# Patient Record
Sex: Male | Born: 1950 | Race: White | Hispanic: No | Marital: Married | Smoking: Former smoker
Health system: Southern US, Community
[De-identification: ages and names within clinical notes are randomized; demographics above are authoritative.]

## PROBLEM LIST (undated history)

## (undated) DIAGNOSIS — M545 Low back pain, unspecified: Secondary | ICD-10-CM

## (undated) DIAGNOSIS — Z8781 Personal history of (healed) traumatic fracture: Secondary | ICD-10-CM

## (undated) DIAGNOSIS — S82409A Unspecified fracture of shaft of unspecified fibula, initial encounter for closed fracture: Secondary | ICD-10-CM

## (undated) DIAGNOSIS — N529 Male erectile dysfunction, unspecified: Secondary | ICD-10-CM

## (undated) DIAGNOSIS — R3129 Other microscopic hematuria: Secondary | ICD-10-CM

## (undated) DIAGNOSIS — H9191 Unspecified hearing loss, right ear: Secondary | ICD-10-CM

## (undated) DIAGNOSIS — Z87438 Personal history of other diseases of male genital organs: Secondary | ICD-10-CM

## (undated) DIAGNOSIS — E785 Hyperlipidemia, unspecified: Secondary | ICD-10-CM

## (undated) DIAGNOSIS — Z87442 Personal history of urinary calculi: Secondary | ICD-10-CM

## (undated) DIAGNOSIS — H9312 Tinnitus, left ear: Secondary | ICD-10-CM

## (undated) DIAGNOSIS — R310 Gross hematuria: Secondary | ICD-10-CM

## (undated) DIAGNOSIS — M8660 Other chronic osteomyelitis, unspecified site: Secondary | ICD-10-CM

## (undated) DIAGNOSIS — E669 Obesity, unspecified: Secondary | ICD-10-CM

## (undated) DIAGNOSIS — E66812 Obesity, class 2: Secondary | ICD-10-CM

## (undated) DIAGNOSIS — Z87828 Personal history of other (healed) physical injury and trauma: Secondary | ICD-10-CM

## (undated) DIAGNOSIS — D751 Secondary polycythemia: Secondary | ICD-10-CM

## (undated) DIAGNOSIS — Z86010 Personal history of colonic polyps: Secondary | ICD-10-CM

## (undated) DIAGNOSIS — Z8619 Personal history of other infectious and parasitic diseases: Secondary | ICD-10-CM

## (undated) DIAGNOSIS — G459 Transient cerebral ischemic attack, unspecified: Secondary | ICD-10-CM

## (undated) DIAGNOSIS — N138 Other obstructive and reflux uropathy: Secondary | ICD-10-CM

## (undated) DIAGNOSIS — I10 Essential (primary) hypertension: Secondary | ICD-10-CM

## (undated) DIAGNOSIS — I5189 Other ill-defined heart diseases: Secondary | ICD-10-CM

## (undated) DIAGNOSIS — I4729 Other ventricular tachycardia: Secondary | ICD-10-CM

## (undated) DIAGNOSIS — Z125 Encounter for screening for malignant neoplasm of prostate: Secondary | ICD-10-CM

## (undated) DIAGNOSIS — G4733 Obstructive sleep apnea (adult) (pediatric): Secondary | ICD-10-CM

## (undated) DIAGNOSIS — N401 Enlarged prostate with lower urinary tract symptoms: Secondary | ICD-10-CM

## (undated) DIAGNOSIS — N200 Calculus of kidney: Secondary | ICD-10-CM

## (undated) HISTORY — DX: Benign prostatic hyperplasia with lower urinary tract symptoms: N13.8

## (undated) HISTORY — DX: Hyperlipidemia, unspecified: E78.5

## (undated) HISTORY — PX: TONSILLECTOMY: SUR1361

## (undated) HISTORY — DX: Essential (primary) hypertension: I10

## (undated) HISTORY — DX: Other microscopic hematuria: R31.29

## (undated) HISTORY — DX: Personal history of colonic polyps: Z86.010

## (undated) HISTORY — DX: Obstructive sleep apnea (adult) (pediatric): G47.33

## (undated) HISTORY — DX: Calculus of kidney: N20.0

## (undated) HISTORY — DX: Other ill-defined heart diseases: I51.89

## (undated) HISTORY — PX: OTHER SURGICAL HISTORY: SHX169

## (undated) HISTORY — DX: Unspecified fracture of shaft of unspecified fibula, initial encounter for closed fracture: S82.409A

## (undated) HISTORY — PX: CYSTOSCOPY: SUR368

## (undated) HISTORY — DX: Transient cerebral ischemic attack, unspecified: G45.9

## (undated) HISTORY — DX: Benign prostatic hyperplasia with lower urinary tract symptoms: N40.1

## (undated) HISTORY — PX: TRANSTHORACIC ECHOCARDIOGRAM: SHX275

## (undated) HISTORY — DX: Obesity, class 2: E66.812

## (undated) HISTORY — DX: Low back pain, unspecified: M54.50

## (undated) HISTORY — DX: Low back pain: M54.5

## (undated) HISTORY — DX: Secondary polycythemia: D75.1

## (undated) HISTORY — PX: LUMBAR LAMINECTOMY: SHX95

## (undated) HISTORY — DX: Other ventricular tachycardia: I47.29

## (undated) HISTORY — DX: Other chronic osteomyelitis, unspecified site: M86.60

## (undated) HISTORY — DX: Encounter for screening for malignant neoplasm of prostate: Z12.5

## (undated) HISTORY — DX: Obesity, unspecified: E66.9

---
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
## 2005-04-07 DIAGNOSIS — Z9889 Other specified postprocedural states: Secondary | ICD-10-CM

## 2005-05-11 ENCOUNTER — Inpatient Hospital Stay (HOSPITAL_COMMUNITY): Admission: AD | Admit: 2005-05-11 | Discharge: 2005-05-18 | Payer: Self-pay | Admitting: Internal Medicine

## 2005-05-11 DIAGNOSIS — T847XXA Infection and inflammatory reaction due to other internal orthopedic prosthetic devices, implants and grafts, initial encounter: Secondary | ICD-10-CM

## 2005-05-16 ENCOUNTER — Ambulatory Visit: Payer: Self-pay | Admitting: Internal Medicine

## 2005-05-18 ENCOUNTER — Encounter: Payer: Self-pay | Admitting: Internal Medicine

## 2005-05-22 HISTORY — PX: COLONOSCOPY W/ POLYPECTOMY: SHX1380

## 2005-06-27 ENCOUNTER — Ambulatory Visit: Payer: Self-pay | Admitting: Infectious Diseases

## 2005-09-27 ENCOUNTER — Ambulatory Visit: Payer: Self-pay | Admitting: Infectious Diseases

## 2005-11-03 ENCOUNTER — Ambulatory Visit: Payer: Self-pay | Admitting: Infectious Diseases

## 2005-11-29 ENCOUNTER — Ambulatory Visit: Payer: Self-pay | Admitting: Infectious Diseases

## 2006-02-06 ENCOUNTER — Ambulatory Visit: Payer: Self-pay | Admitting: Internal Medicine

## 2006-02-21 ENCOUNTER — Ambulatory Visit: Payer: Self-pay | Admitting: Infectious Diseases

## 2006-02-21 DIAGNOSIS — M8660 Other chronic osteomyelitis, unspecified site: Secondary | ICD-10-CM

## 2006-02-21 HISTORY — DX: Other chronic osteomyelitis, unspecified site: M86.60

## 2006-04-09 ENCOUNTER — Ambulatory Visit: Payer: Self-pay | Admitting: Internal Medicine

## 2006-04-23 ENCOUNTER — Encounter: Payer: Self-pay | Admitting: Internal Medicine

## 2006-04-23 ENCOUNTER — Encounter (INDEPENDENT_AMBULATORY_CARE_PROVIDER_SITE_OTHER): Payer: Self-pay | Admitting: Specialist

## 2006-04-23 ENCOUNTER — Ambulatory Visit (HOSPITAL_COMMUNITY): Admission: RE | Admit: 2006-04-23 | Discharge: 2006-04-23 | Payer: Self-pay | Admitting: Gastroenterology

## 2006-06-26 ENCOUNTER — Inpatient Hospital Stay (HOSPITAL_COMMUNITY): Admission: RE | Admit: 2006-06-26 | Discharge: 2006-06-28 | Payer: Self-pay | Admitting: Neurosurgery

## 2006-06-27 ENCOUNTER — Ambulatory Visit: Payer: Self-pay | Admitting: Internal Medicine

## 2006-07-06 ENCOUNTER — Encounter: Payer: Self-pay | Admitting: Infectious Diseases

## 2006-08-29 ENCOUNTER — Ambulatory Visit: Payer: Self-pay | Admitting: Internal Medicine

## 2006-11-05 ENCOUNTER — Telehealth (INDEPENDENT_AMBULATORY_CARE_PROVIDER_SITE_OTHER): Payer: Self-pay | Admitting: *Deleted

## 2006-11-06 ENCOUNTER — Ambulatory Visit: Payer: Self-pay | Admitting: Internal Medicine

## 2007-07-17 ENCOUNTER — Encounter: Payer: Self-pay | Admitting: Internal Medicine

## 2007-08-08 ENCOUNTER — Ambulatory Visit: Payer: Self-pay | Admitting: Internal Medicine

## 2007-08-09 ENCOUNTER — Encounter (INDEPENDENT_AMBULATORY_CARE_PROVIDER_SITE_OTHER): Payer: Self-pay | Admitting: *Deleted

## 2007-08-12 ENCOUNTER — Encounter (INDEPENDENT_AMBULATORY_CARE_PROVIDER_SITE_OTHER): Payer: Self-pay | Admitting: *Deleted

## 2007-09-01 LAB — CONVERTED CEMR LAB
ALT: 20 units/L (ref 0–53)
Basophils Absolute: 0.1 10*3/uL (ref 0.0–0.1)
Bilirubin, Direct: 0.1 mg/dL (ref 0.0–0.3)
CO2: 28 meq/L (ref 19–32)
Calcium: 9.1 mg/dL (ref 8.4–10.5)
Chloride: 104 meq/L (ref 96–112)
Cholesterol: 185 mg/dL (ref 0–200)
LDL Cholesterol: 129 mg/dL — ABNORMAL HIGH (ref 0–99)
Lymphocytes Relative: 30.2 % (ref 12.0–46.0)
MCHC: 32.4 g/dL (ref 30.0–36.0)
Neutro Abs: 3.9 10*3/uL (ref 1.4–7.7)
Neutrophils Relative %: 56.1 % (ref 43.0–77.0)
RDW: 14.3 % (ref 11.5–14.6)
Sodium: 139 meq/L (ref 135–145)
TSH: 1.05 microintl units/mL (ref 0.35–5.50)
Total Bilirubin: 1 mg/dL (ref 0.3–1.2)
Triglycerides: 93 mg/dL (ref 0–149)
VLDL: 19 mg/dL (ref 0–40)

## 2007-09-02 ENCOUNTER — Encounter (INDEPENDENT_AMBULATORY_CARE_PROVIDER_SITE_OTHER): Payer: Self-pay | Admitting: *Deleted

## 2007-10-03 ENCOUNTER — Telehealth (INDEPENDENT_AMBULATORY_CARE_PROVIDER_SITE_OTHER): Payer: Self-pay | Admitting: *Deleted

## 2007-10-28 ENCOUNTER — Telehealth (INDEPENDENT_AMBULATORY_CARE_PROVIDER_SITE_OTHER): Payer: Self-pay | Admitting: *Deleted

## 2008-06-25 ENCOUNTER — Ambulatory Visit: Payer: Self-pay | Admitting: Internal Medicine

## 2008-06-29 ENCOUNTER — Encounter (INDEPENDENT_AMBULATORY_CARE_PROVIDER_SITE_OTHER): Payer: Self-pay | Admitting: *Deleted

## 2008-07-06 ENCOUNTER — Encounter: Payer: Self-pay | Admitting: Internal Medicine

## 2008-10-07 ENCOUNTER — Ambulatory Visit: Payer: Self-pay | Admitting: Family Medicine

## 2008-10-07 DIAGNOSIS — R252 Cramp and spasm: Secondary | ICD-10-CM

## 2008-10-07 LAB — CONVERTED CEMR LAB
BUN: 14 mg/dL (ref 6–23)
Chloride: 108 meq/L (ref 96–112)
Magnesium: 2.3 mg/dL (ref 1.5–2.5)
Potassium: 4.4 meq/L (ref 3.5–5.1)

## 2008-10-08 ENCOUNTER — Encounter: Payer: Self-pay | Admitting: Family Medicine

## 2008-10-08 ENCOUNTER — Encounter (INDEPENDENT_AMBULATORY_CARE_PROVIDER_SITE_OTHER): Payer: Self-pay | Admitting: *Deleted

## 2008-10-11 LAB — CONVERTED CEMR LAB: Vit D, 25-Hydroxy: 36 ng/mL (ref 30–89)

## 2008-10-12 ENCOUNTER — Encounter (INDEPENDENT_AMBULATORY_CARE_PROVIDER_SITE_OTHER): Payer: Self-pay | Admitting: *Deleted

## 2009-03-09 ENCOUNTER — Telehealth (INDEPENDENT_AMBULATORY_CARE_PROVIDER_SITE_OTHER): Payer: Self-pay | Admitting: *Deleted

## 2009-03-30 ENCOUNTER — Ambulatory Visit: Payer: Self-pay | Admitting: Internal Medicine

## 2009-03-30 DIAGNOSIS — Z860100 Personal history of colon polyps, unspecified: Secondary | ICD-10-CM | POA: Insufficient documentation

## 2009-03-30 DIAGNOSIS — N400 Enlarged prostate without lower urinary tract symptoms: Secondary | ICD-10-CM

## 2009-03-30 DIAGNOSIS — E785 Hyperlipidemia, unspecified: Secondary | ICD-10-CM

## 2009-03-30 DIAGNOSIS — E559 Vitamin D deficiency, unspecified: Secondary | ICD-10-CM | POA: Insufficient documentation

## 2009-04-05 ENCOUNTER — Encounter (INDEPENDENT_AMBULATORY_CARE_PROVIDER_SITE_OTHER): Payer: Self-pay | Admitting: *Deleted

## 2009-04-26 ENCOUNTER — Telehealth (INDEPENDENT_AMBULATORY_CARE_PROVIDER_SITE_OTHER): Payer: Self-pay | Admitting: *Deleted

## 2009-05-17 ENCOUNTER — Ambulatory Visit: Payer: Self-pay | Admitting: Internal Medicine

## 2009-05-17 ENCOUNTER — Telehealth: Payer: Self-pay | Admitting: Internal Medicine

## 2009-05-24 ENCOUNTER — Telehealth: Payer: Self-pay | Admitting: Internal Medicine

## 2009-06-22 ENCOUNTER — Ambulatory Visit: Payer: Self-pay | Admitting: Internal Medicine

## 2009-07-05 LAB — CONVERTED CEMR LAB
Albumin: 3.9 g/dL (ref 3.5–5.2)
Cholesterol: 181 mg/dL (ref 0–200)
HDL: 43.4 mg/dL (ref >39.00)
Total Protein: 7.2 g/dL (ref 6.0–8.3)
VLDL: 22 mg/dL (ref 0.0–40.0)

## 2009-09-22 ENCOUNTER — Encounter: Payer: Self-pay | Admitting: Internal Medicine

## 2009-09-28 ENCOUNTER — Encounter: Payer: Self-pay | Admitting: Internal Medicine

## 2010-04-21 ENCOUNTER — Ambulatory Visit: Payer: Self-pay | Admitting: Internal Medicine

## 2010-04-28 ENCOUNTER — Telehealth (INDEPENDENT_AMBULATORY_CARE_PROVIDER_SITE_OTHER): Payer: Self-pay | Admitting: *Deleted

## 2010-04-28 ENCOUNTER — Ambulatory Visit: Payer: Self-pay | Admitting: Internal Medicine

## 2010-06-11 ENCOUNTER — Encounter: Payer: Self-pay | Admitting: Internal Medicine

## 2010-06-11 ENCOUNTER — Encounter: Payer: Self-pay | Admitting: Infectious Diseases

## 2010-06-16 ENCOUNTER — Encounter: Payer: Self-pay | Admitting: Internal Medicine

## 2010-06-16 ENCOUNTER — Ambulatory Visit
Admission: RE | Admit: 2010-06-16 | Discharge: 2010-06-16 | Payer: Self-pay | Source: Home / Self Care | Attending: Internal Medicine | Admitting: Internal Medicine

## 2010-06-16 DIAGNOSIS — M546 Pain in thoracic spine: Secondary | ICD-10-CM | POA: Insufficient documentation

## 2010-06-16 DIAGNOSIS — I10 Essential (primary) hypertension: Secondary | ICD-10-CM | POA: Insufficient documentation

## 2010-06-16 LAB — CONVERTED CEMR LAB
Cholesterol, target level: 200 mg/dL
LDL Goal: 160 mg/dL

## 2010-06-19 LAB — CONVERTED CEMR LAB
ALT: 23 units/L (ref 0–53)
AST: 19 units/L (ref 0–37)
BUN: 7 mg/dL (ref 6–23)
Basophils Relative: 0.8 % (ref 0.0–3.0)
Chloride: 103 meq/L (ref 96–112)
Eosinophils Relative: 2.5 % (ref 0.0–5.0)
HCT: 52.6 % — ABNORMAL HIGH (ref 39.0–52.0)
Hemoglobin: 18 g/dL — ABNORMAL HIGH (ref 13.0–17.0)
Lymphs Abs: 1.6 10*3/uL (ref 0.7–4.0)
MCV: 91 fL (ref 78.0–100.0)
Monocytes Absolute: 1.2 10*3/uL — ABNORMAL HIGH (ref 0.1–1.0)
Neutro Abs: 3.2 10*3/uL (ref 1.4–7.7)
Potassium: 3.9 meq/L (ref 3.5–5.1)
RBC: 5.78 M/uL (ref 4.22–5.81)
Sodium: 141 meq/L (ref 135–145)
TSH: 1.11 microintl units/mL (ref 0.35–5.50)
Total Protein: 7.5 g/dL (ref 6.0–8.3)
Vit D, 25-Hydroxy: 38 ng/mL (ref 30–89)
WBC: 6.3 10*3/uL (ref 4.5–10.5)

## 2010-06-21 ENCOUNTER — Ambulatory Visit
Admission: RE | Admit: 2010-06-21 | Discharge: 2010-06-21 | Payer: Self-pay | Source: Home / Self Care | Attending: Internal Medicine | Admitting: Internal Medicine

## 2010-06-21 ENCOUNTER — Other Ambulatory Visit: Payer: Self-pay | Admitting: Internal Medicine

## 2010-06-21 ENCOUNTER — Encounter: Payer: Self-pay | Admitting: Internal Medicine

## 2010-06-21 LAB — BASIC METABOLIC PANEL
BUN: 14 mg/dL (ref 6–23)
CO2: 29 mEq/L (ref 19–32)
GFR: 66.55 mL/min (ref >60.00)
Glucose, Bld: 94 mg/dL (ref 70–99)
Potassium: 4.7 mEq/L (ref 3.5–5.1)

## 2010-06-21 LAB — CBC WITH DIFFERENTIAL/PLATELET
Eosinophils Absolute: 0.2 10*3/uL (ref 0.0–0.7)
Eosinophils Relative: 2.8 % (ref 0.0–5.0)
HCT: 52.2 % — ABNORMAL HIGH (ref 39.0–52.0)
Lymphocytes Relative: 31 % (ref 12.0–46.0)
Monocytes Absolute: 0.8 10*3/uL (ref 0.1–1.0)
Monocytes Relative: 9.7 % (ref 3.0–12.0)
Neutro Abs: 4.6 10*3/uL (ref 1.4–7.7)
Neutrophils Relative %: 56.1 % (ref 43.0–77.0)
Platelets: 172 10*3/uL (ref 150.0–400.0)
RDW: 15 % — ABNORMAL HIGH (ref 11.5–14.6)
WBC: 8.2 10*3/uL (ref 4.5–10.5)

## 2010-06-21 LAB — LIPID PANEL
HDL: 38.4 mg/dL — ABNORMAL LOW (ref >39.00)
Total CHOL/HDL Ratio: 5
VLDL: 30 mg/dL (ref 0.0–40.0)

## 2010-06-21 LAB — HEPATIC FUNCTION PANEL
Albumin: 4.3 g/dL (ref 3.5–5.2)
Total Protein: 7.5 g/dL (ref 6.0–8.3)

## 2010-06-21 LAB — TSH: TSH: 1.21 u[IU]/mL (ref 0.35–5.50)

## 2010-06-23 NOTE — Progress Notes (Signed)
Summary: Triage/Med Request  Phone Note Refill Request Message from:  Patient on May 24, 2009 9:05 AM  Refills Requested: Medication #1:  LEVAQUIN 500 MG TABS 1 once daily. He feels better but feels like he needs another 10 day dosage to get it completely gone. Pt. stated that he only had a 5 day dosage given to him and feels like 10 days would help get it completely gone. Call his cell phone [phone] as soon as possible  Initial call taken by: Michaelle Copas,  May 24, 2009 9:05 AM  Follow-up for Phone Call        #5, 1 once daily . Report diarrhea(take Align once daily for any bowel changes) Follow-up by: Marga Melnick MD,  May 24, 2009 9:30 AM  Additional Follow-up for Phone Call Additional follow up Details #1::        pt aware, rx sent to pharmacy.........Marland KitchenFelecia Deloach CMA  May 24, 2009 9:45 AM     Prescriptions: LEVAQUIN 500 MG TABS (LEVOFLOXACIN) 1 once daily  #5 x 0   Entered by:   Jeremy Johann CMA   Authorized by:   Marga Melnick MD   Signed by:   Jeremy Johann CMA on 05/24/2009   Method used:   Faxed to ...       [facility]* (retail)       [address] [place], [place]  [address]       Ph: [phone]       Fax: [phone]   RxID:   [id number]

## 2010-06-23 NOTE — Progress Notes (Signed)
Summary: [place]: ? Sinus Infection  Phone Note Call from Patient Call back at [phone]   Complaint: Urinary/GYN Problems Summary of Call: Message left on Triage Voicemail: Please call-? sinus infection  Patient was seen on 04/21/2010, all symptoms subsided but now:  Bridge of nose hurts(feels like someone hit him in the nose) &  runny nose, patient really feels like he has a sinus infection and would like an antibiotic called in.  Dr.Hopper please advise [facility]  Initial call taken by: Shonna Chock CMA,  April 28, 2010 1:35 PM  Follow-up for Phone Call        I spoke with patient, per Dr.Hopper nasel spray can be called in. Patient needs to watch for fever and discolored drainage.  Patient scheduled appointment for today at 4:20pm and indicated he was at his dermatologist office and if they could prescribe something he will cancle appointment here. Follow-up by: Shonna Chock CMA,  April 28, 2010 3:06 PM

## 2010-06-23 NOTE — Assessment & Plan Note (Signed)
Summary: cpx///sph   Vital Signs:  Patient profile:   60 year old male Temp:     98.4 degrees F oral Pulse rate:   84 / minute Resp:     14 per minute BP sitting:   140 / 88  (left arm) Cuff size:   large  Vitals Entered By: Shonna Chock CMA (June 16, 2010 1:34 PM) CC: CPX , Back pain, Lipid Management   Primary Care Provider:  Alwyn Ren  CC:  CPX , Back pain, and Lipid Management.  History of Present Illness:    [name] is here for a physical; he has recurrent low grade back pain, aggravated by poor  posture.This impacts sleep. PMH of fractured L-1.  The patient denies associated  fever, chills, weakness, loss of sensation, fecal incontinence, urinary incontinence, and urinary retention.  The pain is located in the right lower  thoracic back.  The  dul, intermittent pain is made better by inactivity and NSAID medications.    Lipid Management History:      Positive NCEP/ATP III risk factors include male age 75 years old or older.  Negative NCEP/ATP III risk factors include non-diabetic, no family history for ischemic heart disease, non-tobacco-user status, non-hypertensive, no ASHD (atherosclerotic heart disease), no peripheral vascular disease, and no history of aortic aneurysm.     Current Medications (verified): 1)  Tamsulosin Hcl 0.4 Mg Caps (Tamsulosin Hcl) .[name].. 1 By Mouth Once Daily 2)  Adult Aspirin Ec Low Strength 81 Mg  Tbec (Aspirin) .... Take One Tablet Daily 3)  Vitamin D .... Take 1 Tab Once Daily  Allergies: 1)  ! * Larium  Past History:  Past Medical History: OSTEOMYELITIS, CHRONIC, PMH of  (ICD-730.10) in spine , Staph INFECTION DUE TO INTERNAL ORTH DEVICE  [phone]); resolution post removal of hardware Colonic polyps, PMH  of 2007 Hyperlipidemia: Framingham Study LDL goa = < 160. 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, Dr Darvin Neighbours ( he wil se Urologist in  [place])  Past Surgical History: Tonsillectomy Colon polypectomy 2007, Carman Ching MD Lumbar laminectomy post L1 fracture post fall, post operative  course complicated by Staph Osteomyelitis  Family History: father: lung cancer, smoker,on  submarine for 12 years mother: CVA,granuloma of lung, lung cancer maternal grandmother: MI @ 21 maternal grandfather: MI @ 85  Social History: Former Smoker: quit 1985 No diet 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         Weight  varies 230-240  Physical Exam  General:  in no acute distress; alert,appropriate and cooperative throughout examination Head:  Normocephalic and atraumatic without obvious abnormalities.  Eyes:  No corneal or conjunctival inflammation noted. Perrla. Funduscopic exam benign, without hemorrhages, exudates or papilledema.  Ears:  External ear exam shows no significant lesions or deformities.  Otoscopic examination reveals clear canals, tympanic membranes are intact bilaterally without bulging, retraction, inflammation or discharge. Hearing is grossly normal bilaterally. Nose:  External nasal examination shows no deformity or inflammation. Nasal mucosa are pink and moist without lesions or exudates. Mouth:  Oral mucosa and oropharynx without lesions or exudates.  Teeth in good repair. Neck:  No  deformities, masses, or tenderness noted. Lungs:  Normal respiratory effort, chest expands symmetrically. Lungs are clear to auscultation, no crackles or wheezes. Heart:  Normal rate and  regular rhythm. S1 and S2 normal without gallop, murmur, click, rub .S4  Abdomen:  Bowel sounds positive,abdomen soft and non-tender without masses, organomegaly .Ventral hernia  noted. Genitalia:  Urology Msk:  Asymmetry of thoracic muscles; R > L Pulses:  R and L carotid,radial,dorsalis pedis and posterior tibial pulses are full and equal bilaterally Extremities:  No clubbing, cyanosis, edema, or deformity noted with normal full range of motion of all joints.   Neurologic:  alert & oriented X3 and DTRs symmetrical and normal.   Neg SLR Skin:  Intact without suspicious lesions or rashes Cervical Nodes:  No lymphadenopathy noted Axillary Nodes:  No palpable lymphadenopathy Psych:  memory intact for recent and remote, normally interactive, and good eye contact.     Impression & Recommendations:  Problem # 1:  ROUTINE GENERAL MEDICAL EXAM@HEALTH  CARE FACL (ICD-V70.0)  Orders: EKG w/ Interpretation ([address])  Problem # 2:  BACK PAIN, THORACIC REGION, CHRONIC (ICD-724.1)  His updated medication list for this problem includes:    Adult Aspirin Ec Low Strength 81 Mg Tbec (Aspirin) .[name]... Take one tablet daily    Tramadol Hcl 50 Mg Tabs (Tramadol hcl) .[name]... 1every 6 hrs as needed    Cyclobenzaprine Hcl 5 Mg Tabs (Cyclobenzaprine hcl) .[name]... 1-2 at bedtime as needed  Problem # 3:  HYPERLIPIDEMIA (ICD-272.4)  Problem # 4:  COLONIC POLYPS, HX OF (ICD-V12.72)  Orders: Gastroenterology Referral (GI)  Problem # 5:  VITAMIN D DEFICIENCY (ICD-268.9)  Problem # 6:  BENIGN PROSTATIC HYPERTROPHY (ICD-600.00) as per [facility] His updated medication list for this problem includes:    Tamsulosin Hcl 0.4 Mg Caps (Tamsulosin hcl) .[name]... 1 by mouth once daily  Problem # 7:  ELEVATED BLOOD PRESSURE WITHOUT DIAGNOSIS OF HYPERTENSION (ICD-796.2)  Complete Medication List: 1)  Tamsulosin Hcl 0.4 Mg Caps (Tamsulosin hcl) .[name].. 1 by mouth once daily 2)  Adult Aspirin Ec Low Strength 81 Mg Tbec  (Aspirin) .... Take one tablet daily 3)  Vitamin D  .... Take 1 tab once daily 4)  Tramadol Hcl 50 Mg Tabs (Tramadol hcl) .[name].. 1every 6 hrs as needed 5)  Cyclobenzaprine Hcl 5 Mg Tabs (Cyclobenzaprine hcl) .[name].. 1-2 at bedtime as needed  Lipid Assessment/Plan:      Based on NCEP/ATP III, the patient's risk factor category is "0-1 risk factors".  The patient's lipid goals are as follows: Total cholesterol goal is 200; LDL cholesterol goal is 160; HDL cholesterol goal is 40; Triglyceride goal is 150.    Patient Instructions: 1)   Stretching exercises as discussed.Please schedule fasting labs; see Diagnoses for Codes: Vitamin D level; 2)  BMP; 3)  Hepatic Panel ; 4)  Lipid Panel ; 5)  TSH ; 6)  CBC w/ Diff; 7)  PSA . 8)  Check your Blood Pressure regularly. If it is above: 135/85 ON AVERAGE  you should make an appointment. Prescriptions: TAMSULOSIN HCL 0.4 MG CAPS (TAMSULOSIN HCL) 1 by mouth once daily  #90 x 3   Entered and Authorized by:   Marga Melnick MD   Signed by:   Marga Melnick MD on 06/16/2010   Method used:   Electronically to        [facility]* (retail)       [address] [place], [place]  [address]  Ph: [phone]       Fax: [phone]   RxID:   [id number] CYCLOBENZAPRINE HCL 5 MG TABS (CYCLOBENZAPRINE HCL) 1-2 at bedtime as needed  #30 x 1   Entered and Authorized by:   Marga Melnick MD   Signed by:   Marga Melnick MD on 06/16/2010   Method used:   Electronically to        [facility]* (retail)       [address] [place], [place]  [address]       Ph: [phone]       Fax: [phone]   RxID:   [id number] TRAMADOL HCL 50 MG TABS (TRAMADOL HCL) 1every 6 hrs as needed  #30 x 2   Entered and Authorized by:   Marga Melnick MD   Signed by:   Marga Melnick MD on 06/16/2010   Method used:   Electronically to        [facility]* (retail)       [address] [place], [place]  [address]       Ph: [phone]        Fax: [phone]   RxID:   [id number]    Orders Added: 1)  Est. Patient 40-64 years [[address]] 2)  EKG w/ Interpretation [93[address]] 3)  Gastroenterology Referral [GI]   Immunization History:  Tetanus/Td Immunization History:    Tetanus/Td:  historical (05/22/2004)   Immunization History:  Tetanus/Td Immunization History:    Tetanus/Td:  Historical (05/22/2004)    Appended Document: cpx///sph Patient's Height/Weight recorded by Misty Stanley : H: 67.5 inches  W:246.2

## 2010-06-23 NOTE — Assessment & Plan Note (Signed)
Summary: BRONCHITIS/KN   Vital Signs:  Patient profile:   60 year old male Weight:      246.6 pounds BMI:     38.76 O2 Sat:      95 % on Room air Temp:     98.3 degrees F oral Pulse rate:   101 / minute Resp:     15 per minute BP sitting:   132 / 90  (left arm) Cuff size:   large  Vitals Entered By: Shonna Chock CMA (April 21, 2010 3:55 PM)  O2 Flow:  Room air CC: 1.) ? Bronchitis-onset: Sunday    2.) Discuss changing Tamsulosin to Finasteride, URI symptoms   Primary Care Provider:  Alwyn Ren  CC:  1.) ? Bronchitis-onset: Sunday    2.) Discuss changing Tamsulosin to Finasteride and URI symptoms.  History of Present Illness:  RTI Symptoms      This is a 60 year old man who presents with RTI  symptoms since 11/27 ; onset as ST & dry cough.  The patient now  reports nasal congestion and purulent nasal discharge, but denies productive cough and earache.  Associated symptoms include slight  wheezing.  The patient denies fever and dyspnea.  The patient denies headache.  The patient denies the following risk factors for Strep sinusitis: unilateral facial pain, tooth pain, and tender adenopathy.  Rx: Mucinex  Current Medications (verified): 1)  Tamsulosin Hcl 0.4 Mg Caps (Tamsulosin Hcl) .[name].. 1 By Mouth Once Daily 2)  Adult Aspirin Ec Low Strength 81 Mg  Tbec (Aspirin) .... Take One Tablet Daily 3)  Vitamin D .... Take 1 Tab Once Daily  Allergies: 1)  ! * Larium  Physical Exam  General:  in no acute distress; alert,appropriate and cooperative throughout examination Ears:  External ear exam shows no significant lesions or deformities.  Otoscopic examination reveals L TM  erythema superiorly Nose:  External nasal examination shows no deformity or inflammation. Nasal mucosa are pink and moist without lesions or exudates. Mouth:  Oral mucosa and oropharynx without lesions or exudates.  Teeth in good repair. Slightly hoarse Lungs:  Normal respiratory effort, chest expands symmetrically.  Lungs are clear to auscultation, no crackles or wheezes. Heart:  Normal rate and regular rhythm. S1 and S2 normal without gallop, murmur, click, rub or other extra sounds. Cervical Nodes:  No lymphadenopathy noted Axillary Nodes:  No palpable lymphadenopathy   Impression & Recommendations:  Problem # 1:  BRONCHITIS-ACUTE (ICD-466.0)  The following medications were removed from the medication list:    Levaquin 500 Mg Tabs (Levofloxacin) .[name]... 1 once daily His updated medication list for this problem includes:    Azithromycin 250 Mg Tabs (Azithromycin) .[name]... As per pack    Hydromet 5-1.5 Mg/60ml Syrp (Hydrocodone-homatropine) .[name]... 1 tsp every 6 hrs as needed  Problem # 2:  URI (ICD-465.9)  His updated medication list for this problem includes:    Adult Aspirin Ec Low Strength 81 Mg Tbec (Aspirin) .[name]... Take one tablet daily    Hydromet 5-1.5 Mg/73ml Syrp (Hydrocodone-homatropine) .[name]... 1 tsp every 6 hrs as needed  Complete Medication List: 1)  Tamsulosin Hcl 0.4 Mg Caps (Tamsulosin hcl) .[name].. 1 by mouth once daily 2)  Adult Aspirin Ec Low Strength 81 Mg Tbec (Aspirin) .... Take one tablet daily 3)  Vitamin D  .... Take 1 tab once daily 4)  Azithromycin 250 Mg Tabs (Azithromycin) .... As per pack 5)  Hydromet 5-1.5 Mg/27ml Syrp (Hydrocodone-homatropine) .[name].. 1 tsp every 6 hrs as  needed  Patient Instructions: 1)  Neti pot once daily as needed for sinus congestion. 2)  Drink as much NON dairy  fluid as you can tolerate for the next few days. Prescriptions: HYDROMET 5-1.5 MG/5ML SYRP (HYDROCODONE-HOMATROPINE) 1 tsp every 6 hrs as needed  #120 cc x 0   Entered and Authorized by:   Marga Melnick MD   Signed by:   Marga Melnick MD on 04/21/2010   Method used:   Printed then faxed to ...       [facility]* (retail)       [address] [place], [place]  [address]       Ph: [phone]       Fax: [phone]   RxID:   [id number] AZITHROMYCIN 250 MG TABS (AZITHROMYCIN) as  per pack  #1 x 0   Entered and Authorized by:   Marga Melnick MD   Signed by:   Marga Melnick MD on 04/21/2010   Method used:   Printed then faxed to ...       [facility]* (retail)       [address] [place], [place]  [address]       Ph: [phone]       Fax: [phone]   RxID:   [id number]    Orders Added: 1)  Est. Patient Level III [[address]]

## 2010-06-23 NOTE — Consult Note (Signed)
Summary: [facility]  [facility]   Imported By: Lanelle Bal 10/06/2009 10:02:55  _____________________________________________________________________  External Attachment:    Type:   Image     Comment:   External Document

## 2010-06-23 NOTE — Letter (Signed)
Summary: [facility]  [facility]   Imported By: Lanelle Bal 10/20/2009 08:37:03  _____________________________________________________________________  External Attachment:    Type:   Image     Comment:   External Document

## 2010-06-27 ENCOUNTER — Telehealth: Payer: Self-pay | Admitting: Internal Medicine

## 2010-07-07 NOTE — Letter (Signed)
Summary: [facility]  [facility]   Imported By: Sherian Rein 06/28/2010 11:53:54  _____________________________________________________________________  External Attachment:    Type:   Image     Comment:   External Document

## 2010-07-07 NOTE — Procedures (Signed)
Summary: Colonoscopy/[place]  Colonoscopy/[place]   Imported By: Sherian Rein 06/28/2010 11:20:58  _____________________________________________________________________  External Attachment:    Type:   Image     Comment:   External Document

## 2010-07-07 NOTE — Progress Notes (Signed)
Summary: ABX request  Phone Note Call from Patient Call back at [phone]   Summary of Call: Patient notes that he has the "crud" again and would like ABX called in since he was just seen. I see that the pt did have his CPX one week ago and was seen a couple of months ago for this issue. I offered office visit today and patient declined and expressed how dissatisfied he is with this and notes that he can call his dentist and get it right now if he wants to. Patient is requesting that you call in ABX without office visit. Please advise. Initial call taken by: Lucious Groves CMA,  June 27, 2010 9:43 AM  Follow-up for Phone Call        Amox 500 three times a day #30 Follow-up by: Marga Melnick MD,  June 27, 2010 11:53 AM  Additional Follow-up for Phone Call Additional follow up Details #1::        Patient notified. Additional Follow-up by: Lucious Groves CMA,  June 27, 2010 1:44 PM    New/Updated Medications: AMOXICILLIN 500 MG TABS (AMOXICILLIN) 1 by mouth three times a day Prescriptions: AMOXICILLIN 500 MG TABS (AMOXICILLIN) 1 by mouth three times a day  #30 x 0   Entered by:   Lucious Groves CMA   Authorized by:   Marga Melnick MD   Signed by:   Lucious Groves CMA on 06/27/2010   Method used:   Electronically to        [facility]* (retail)       [address] [place], [place]  [address]       Ph: [phone]       Fax: [phone]   RxID:   [id number]

## 2010-08-03 ENCOUNTER — Other Ambulatory Visit: Payer: Self-pay | Admitting: Gastroenterology

## 2010-09-05 ENCOUNTER — Encounter: Payer: Self-pay | Admitting: Internal Medicine
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
## 2010-10-07 NOTE — Op Note (Signed)
NAMECOREE, BRAME               ACCOUNT NO.:  [id number]   MEDICAL RECORD NO.:  [id number]          PATIENT TYPE:  AMB   LOCATION:  ENDO                         FACILITY:  [facility]   PHYSICIAN:  James L. Malon Kindle., M.D.DATE OF BIRTH:  November 02, 1950   DATE OF PROCEDURE:  04/23/2006  DATE OF DISCHARGE:                               OPERATIVE REPORT   SURGEON:  Fayrene Fearing L. Randa Evens, M.D.   PROCEDURE:  Colonoscopy and polypectomy.   MEDICATIONS:  Fentanyl 70 mcg, Versed 6 mg IV.   SCOPE:  Pediatric adjustable scope.   INDICATIONS:  Colon cancer screening.   DESCRIPTION OF PROCEDURE:  The procedure had been explained to the  patient and consent obtained.  Left lateral decubitus position.  Digital  exam performed.  The scope inserted and advanced.  The prep was  excellent.  Advanced to the cecum.  The ileocecal valve and appendiceal  orifice were seen, and the scope withdrawn.  Cecum, ascending and  transverse colon seemed well free of polyps.  In the descending colon, a  0.5-cm sessile polyp removed with the cautery snare and sucked with the  scope.  No bleeding seen.  The remainder of the descending and sigmoid  colon and rectum free of polyps.  The rectum was examined in the  retroflex view.  Pertinent negatives  Included diverticula, masses,  tumors, AVMs, etc. The patient tolerated the procedure well with no  immediate complications.   ASSESSMENT:  Descending colon, removed.   PLAN:  Routine postpolypectomy instructions.  Depending on the path, may  recommend repeat in 5 years.           ______________________________  Llana Aliment Malon Kindle., M.D.     Waldron Session  D:  04/23/2006  T:  04/23/2006  Job:  [id number]   cc:   Titus Dubin. Alwyn Ren, MD,FACP,FCCP

## 2010-10-07 NOTE — Consult Note (Signed)
[name], [name] NO.:  [id number]   MEDICAL RECORD NO.:  [id number]          PATIENT TYPE:  INP   LOCATION:  3005                         FACILITY:  [facility]   PHYSICIAN:  Derrick [name], M.D.  DATE OF BIRTH:  [birth year]   DATE OF CONSULTATION:  05/11/2005  DATE OF DISCHARGE:  05/11/2005                                   CONSULTATION   REASON FOR CONSULTATION:  Derrick [name] is a 60 year old man who is a  patient of Dr. Marinda [name] who fell out of a tree stand while hunting deer  in [place] on April 07, 2005.  He had a 16-foot fall resulting in an L1  burst fracture.  He was transferred to [place], [place] where he underwent a  posterior decompression and stabilization procedure which consisted of  pedicle screw fixation for significant left L1 radiculopathy with  retropulsed bone.  This was successfully treated and he was mobilized in the  [place] brace.  One and a half weeks ago, he developed serous drainage  from his wound and then this has gradually increased to purulent drainage  and changed to pus.  He has had spiking fevers but has not had fever while  on antibiotics until yesterday evening.  He does note some decreased  appetite.  Does not note significant nausea, vomiting or fever at the  present time.  He has had an ileus postoperatively.  Otherwise, no  significant problems.  He does have a history of BPH.   He has no known drug allergies.   CURRENT MEDICATIONS:  Flomax 0.4 mg q.h.s., oxycodone 10 mg one to two  tablets every 12 hours, Bactrim trimethoprim two tablets twice daily.  He  previously was treated with cephalexin and Augmentin and these were then  discontinued.  He also takes Skelaxin 800 mg p.o. q.6h. p.r.n. and uses  GlycoLax for laxative.   His current vital signs are temperature of 98.5, blood pressure of 107/61,  pulse of 91, respiratory rate of 20.  On his examination, he has a healing  midline thoracolumbar incision with a small  area of dehiscence with  yellowish purulent drainage from this.  I was not able to express a  significant amount of pus with compression of the wound but he does have  some significant purulent drainage.  Initial cultures have been consistent  with methicillin-sensitive Staph. aureus.  [facility] hospitalist discussed his  situation with Dr. Orvan [name] who recommended that he be treated with Ancef 1  g every 6 hours IV.   On examination today, he is alert and fully oriented.  He has no  meningismus.  He does not appear to be septic.  He has no severe tenderness  over his incision.  His lower extremity strength is full in all motor groups  and bilaterally symmetric.  Reflexes are 2 in the knees and ankles.  Toes  are downgoing to plantar stimulation.  He denies any bowel or bladder  dysfunction.   My impression is of postoperative wound infection in a 60 year old man who  had posterior thoracolumbar stabilization surgery while on vacation  deer  hunting in [place].  I have recommended that he have an MRI of his  thoracolumbar spine with __________ gadolinium to assess for abscess and  also plain radiographs.  He may require incision and drainage and  debridement of this wound but initially would treat him with IV antibiotics  and dressing changes.      Derrick [name], M.D.  Electronically Signed     JDS/MEDQ  D:  05/11/2005  T:  05/14/2005  Job:  [id number]

## 2010-10-07 NOTE — H&P (Signed)
NAMEBRAIDAN, Derrick [name]               ACCOUNT NO.:  [id number]   MEDICAL RECORD NO.:  [id number]          PATIENT TYPE:  INP   LOCATION:  3005                         FACILITY:  [facility]   PHYSICIAN:  Melissa L. Ladona Ridgel, MD  DATE OF BIRTH:  Jun 27, 1950   DATE OF ADMISSION:  05/11/2005  DATE OF DISCHARGE:                                HISTORY & PHYSICAL   CHIEF COMPLAINT:  Fever and infection of a post-surgical site.   PRIMARY CARE PHYSICIAN:  Dr. Marinda Elk.   HISTORY OF PRESENT ILLNESS:  The patient is a 60 year old white male who  fell of a tree stand on April 07, 2005. The patient took a 16 foot fall  landing on his buttocks, resulting in an L1 burst fracture. The patient  underwent extensive surgery to decompress an entrapped nerve root and  recovered, returning to the [facility] area for his further recovery.  Approximately 1-1/2 weeks ago he developed the onset of leakage of clear  fluid from the wound. Dr. Foy Guadalajara started him on antibiotics and obtained  cultures. When the fluid that was draining went from clear in color to a  more purulent material, antibiotics were changed. The cultures returned  showing a methicillin sensitive staph aureus organism and the patient  ultimately was placed on a third antibiotic when the drainage did not appear  to be decreasing. During his course of antibiotic therapy the patient was  not having fevers, however, last night he did have a recurrent fever with no  persistence of the drainage. He therefore decided to come to the emergency  room today for further evaluation.   REVIEW OF SYSTEMS:  Positive fever. No nausea, vomiting. Decreased appetite.  No diarrhea, no constipation. All other review of systems are negative.   PAST MEDICAL HISTORY:  1.  Benign prostatic hypertrophy.  2.  A postoperative ileus.   PAST SURGICAL HISTORY:  Lumbar surgery on the L1 root which was entrapped  and decompressed.   SOCIAL HISTORY:  Denies tobacco,  ethanol or illicit drug use. He is a  Visual merchandiser.   FAMILY HISTORY:  Mom is living in a nursing home after a stroke. Dad is  deceased from lung cancer.   ALLERGIES:  No known drug allergies.   MEDICATIONS:  1.  Flomax 0.4 mg q.h.s.  2.  Oxycodone 10 mg 1-2 tablets p.o. q.12h.  3.  Bactrim two tablets b.i.d. This was the third antibiotic following      amoxicillin and cephalexin.  4.  Skelaxin 800 mg p.o. q.6h p.r.n.  5.  Glycolax every other day.   PHYSICAL EXAMINATION:  VITAL SIGNS:  Temperature is 98.5, blood pressure  107/61, pulse is 91, respirations 20, saturation is 95%.  GENERAL:  He is in no acute distress.  HEENT:  Normocephalic, atraumatic. Pupils are equal, round and reactive to  light. Extraocular muscles are intact. Mucous membranes are moist.  NECK:  Supple. There is no JVD, no lymphadenopathy and no carotid bruits.  CHEST:  Clear to auscultation without rhonchi, rales or wheezes.  CARDIOVASCULAR:  Regular rate and rhythm, positive S1, S2.  No S3, S4. No  murmurs, rubs, or gallops.  ABDOMEN:  Soft, nontender, nondistended with positive bowel sounds.  EXTREMITIES:  Show no clubbing, cyanosis or edema. He has 2+ pulses. He does  have pain with dorsiflexion of the left foot and pressure on the left leg.  NEUROLOGIC:  Sensation appears to be intact. Power appears to be 5/5 in all  extremities. There is some pain associated with left leg lift and pressure  as well as dorsiflexion.   LABORATORY DATA:  Urinalysis is negative. Sodium is 137, potassium 4.5,  chloride 99, CO2 is 25, BUN is 10, creatinine is 1.4. Glucose is 115. His  liver function tests are within normal limits.  Cultures from December 14th in the outpatient setting are located in the  back of the chart and are growing methicillin sensitive staph aureus. Wound  cultures are pending from the emergency room today.   ASSESSMENT AND PLAN:  This is a 60 year old white male status post a fall  from a  tree stand, resulting in L1 burst fracture with resultant nerve root  compression. The patient underwent decompression surgery and postoperatively  was doing well until the wound started to drain serous material. The only  complicating factor to the surgery was a postoperative ileus. In response to  the drainage that started 1-1/2 weeks ago, the patient has been treated with  Keflex, amoxicillin and then Bactrim. When patient started spiking  temperatures again last night he came to the emergency room for further  evaluation.  1.  Infected post surgical site, cultures were growing MSSA on December 14.      We will start him on Ancef and if patient shows signs of worsening      infection or second cultures grow a different organism, we will make      changes of his antibiotics. We are going to get an MRI tonight. Will use      regular dressing change and/or could use a colostomy phalange with a bag      attached so that the dressings do not have to be changed as frequently.  2.  Pain control. Will continue Skelaxin and Oxycodone.  3.  Deep venous thrombosis prophylaxis. Will use STDs for now as the patient      may need to go to the OR in the near future. If he is merely going to      have antibiotic therapy I will switch him to Lovenox.  4.  BPH. Will continue his Flomax.  5.  Gastrointestinal. Will use MiraLax p.r.n. to prevent ileus.      Melissa L. Ladona Ridgel, MD  Electronically Signed     MLT/MEDQ  D:  05/11/2005  T:  05/11/2005  Job:  [id number]   cc:   Molly Maduro L. Foy Guadalajara, M.D.  Fax: [phone]

## 2010-10-07 NOTE — Op Note (Signed)
Derrick [name], [name] NO.:  [id number]   MEDICAL RECORD NO.:  [id number]          PATIENT TYPE:  INP   LOCATION:  2899                         FACILITY:  [facility]   PHYSICIAN:  Danae Orleans. Venetia Maxon, M.D.  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   PROCEDURE:  1. Exploration of the thoracolumbar fusion.  2. Removal of hardware.   SURGEON:  Venetia Maxon   ASSISTANT:  Phoebe Perch with [place], RN   ANESTHESIA:  General endotracheal anesthesia.   BLOOD LOSS:  Minimal.   COMPLICATIONS:  None.   DISPOSITION:  Recovery.   INDICATIONS:  Derrick [name] is a 60 year old man who previously had an  L1 burst fracture after he fell out of a tree stand while hunting in  [place] in 2006.  He came back to [facility] which is where he resides and  developed an infection in his operative site, and I became involved in  his care at that point and initially debrided his wound.  The patient  went on to heal by secondary intention and had a prolonged course of IV  antibiotics.  The patient was put on chronic suppressive therapy under  the guidance of the infectious diseases but every time an effort was  made to stop his antibiotics, he developed spiking fevers.  This went on  3 separate occasions.  His white count also would be elevated.  It was  therefore elected to take him back to surgery to remove his previously  placed instrumentation.  It appeared that he had an adequate arthrodesis  at the previously operated levels.  The plan was not to place any new  hardware but to remove his previously placed hardware and confirm that  there appeared to be a solid fusion.   PROCEDURE:  [name] was  brought to the operating room.  Following a  satisfactory and uncomplicated induction of general endotracheal  anesthesia and placement of intravenous lines, the patient was placed in  a prone position on the operating table, a Foley catheter placed  previous to turning the patient.  His low and mid back were then prepped  and draped in the usual sterile fashion using Betadine scrub and paint.  His area of planned incision was infiltrated with 0.25% Marcaine, 0.5%  lidocaine with 1:100,000 epinephrine.  His previous scar was revised,  and the midline scar was removed.  The incision was carried down to the  previously placed [place] and posterior hardware.  This was all removed,  and the cross links were taken along with several of the screws and sent  to microbiology department for culturing.  There was some yellowish  fluid but not grossly purulent on exposure of the hardware and,  additionally, soft tissues were removed adjacent to the hardware, and  this was also sent for Gram stain and culture.  After the previously  placed hardware was all removed, the Pulsavac Power Irrigator was then  utilized with 2 L of bacitracin saline, and the soft tissues were washed  extensively.  An additional 500 mL of bacitracin was also used to wash  the wound and the surrounding soft tissues.  The posterior fusion mass  was inspected, and exposure was carried to this and using a [place]  rongeur with towel clips to try to mobilize the spine, there did not  appear to be any instability.  Since there was inadequate healing and  posterior fusion, it was elected not to place any new hardware, and the  medium Hemovac drain was inserted through a separate stab exit.  The  fascia was then closed with 1 Vicryl sutures, the subcutaneous tissues  reapproximated with 2-0 Vicryl interrupted inverted sutures, and the  skin edges were reapproximated with interrupted 3-0 Vicryl subcuticular  stitch.  Wound was dressed  with Benzoin, Steri-Strips, Telfa gauze, and  tape.  The patient was extubated in the operating room and taken to the  recovery room in stable satisfactory condition having tolerated his  operation well.  Counts were correct at the case.      Danae Orleans. Venetia Maxon, M.D.  Electronically Signed     JDS/MEDQ  D:  06/26/2006  T:  06/26/2006  Job:  [id number]

## 2010-10-07 NOTE — Discharge Summary (Signed)
[name], Derrick [name] NO.:  [id number]   MEDICAL RECORD NO.:  [id number]          PATIENT TYPE:  INP   LOCATION:  3006                         FACILITY:  [facility]   PHYSICIAN:  Danae Orleans. Venetia Maxon, M.D.  DATE OF BIRTH:  29-Mar-1951   DATE OF ADMISSION:  06/26/2006  DATE OF DISCHARGE:  06/28/2006                               DISCHARGE SUMMARY   REASON FOR ADMISSION:  1. 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  2. 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   FINAL DIAGNOSES:  1. 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  2. 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   HISTORY OF ILLNESS/HOSPITAL COURSE:  This is a 60 year old man who has  had chronic thoracolumbar infection status post burst fracture and  posterior fusion.  It was elected to take him to surgery for exploration  and fusion removal of his hardware.  He tolerated this procedure well.  He was seen by infectious disease postoperatively with the plan of  keeping him on oral Keflex and rifampin with instructions to followup in  my office in 3 weeks for a recheck.      Danae Orleans. Venetia Maxon, M.D.  Electronically Signed     JDS/MEDQ  D:  09/20/2006  T:  09/21/2006  Job:  [id number]

## 2010-10-07 NOTE — Op Note (Signed)
NAMEWYMON, SWANEY NO.:  [id number]   MEDICAL RECORD NO.:  [id number]          PATIENT TYPE:  INP   LOCATION:  3005                         FACILITY:  [facility]   PHYSICIAN:  Danae Orleans. Venetia Maxon, M.D.  DATE OF BIRTH:  April 03, 1951   DATE OF PROCEDURE:  05/12/2005  DATE OF DISCHARGE:                                 OPERATIVE REPORT   PREOPERATIVE DIAGNOSIS:  Thoracolumbar wound infection.   POSTOPERATIVE DIAGNOSIS:  Thoracolumbar wound infection.   PROCEDURE:  I & D of thoracolumbar wound.   SURGEON:  Danae Orleans. Venetia Maxon, M.D.   ANESTHESIA:  General endotracheal anesthesia.   ESTIMATED BLOOD LOSS:  Minimal.   COMPLICATIONS:  None.   DISPOSITION:  Recovery.   INDICATIONS:  Derrick [name] is a 60 year old man who fell out of a tree  stand in [place] while hunting and had an L1 burst fracture which resulted in  his undergoing surgery in [place].  He came back home to [place] and shortly  thereafter developed a wound infection.  He presented to the emergency room  with pus draining from his back.  He was admitted to the hospitalist service  tonight and I took him to surgery to debride his lumbar wound.   DESCRIPTION OF PROCEDURE:  Mr. Espindola was brought to the operating room.  Following the satisfaction and uncomplicated induction of general  endotracheal anesthesia, the patient was placed in the prone position on the  operating table.  His previous incision was draining a copious amount of  purulent material and it was reopened after prepping and draping in the  usual sterile fashion.  This was carried down to the hardware which was  grossly contaminated.  The inferior half of the incision was opened but the  pus pocket appeared to extend the entire length of the subfascial plane.  This was then carefully debrided both sharply and also with blunt  dissection, and there was a significant amount of bone graft which was  removed.  Then the power irrigator was used to  irrigate the wound and  finally a vac dressing was placed and hooked to a pressure setting of 125,  and the patient was then extubated and returned to the supine position on  the operating stretcher.  He was taken to the recovery room in stable,  satisfactory condition, having tolerated the surgical procedure well.      Danae Orleans. Venetia Maxon, M.D.  Electronically Signed    JDS/MEDQ  D:  05/12/2005  T:  05/15/2005  Job:  [id number]

## 2011-06-07 ENCOUNTER — Ambulatory Visit (INDEPENDENT_AMBULATORY_CARE_PROVIDER_SITE_OTHER): Payer: Managed Care, Other (non HMO) | Admitting: Internal Medicine

## 2011-06-07 ENCOUNTER — Encounter: Payer: Self-pay | Admitting: Internal Medicine

## 2011-06-07 VITALS — BP 124/78 | HR 91 | Temp 97.5°F | Wt 253.0 lb

## 2011-06-07 DIAGNOSIS — J329 Chronic sinusitis, unspecified: Secondary | ICD-10-CM

## 2011-06-07 MED ORDER — FLUTICASONE PROPIONATE 50 MCG/ACT NA SUSP: 1.0000 | Freq: Two times a day (BID) | NASAL | Status: DC | PRN

## 2011-06-07 MED ORDER — CLARITHROMYCIN ER 500 MG PO TB24: 1000.0000 mg | ORAL_TABLET | Freq: Every day | ORAL | Status: AC

## 2011-06-07 NOTE — Patient Instructions (Addendum)
Plain Mucinex for thick secretions ;force NON dairy fluids . Use a Neti pot daily as needed for sinus congestion .Flonas 1 spray in each nostril twice a day as needed. Use the "crossover" technique as discussed

## 2011-06-07 NOTE — Progress Notes (Signed)
  Subjective:    Patient ID: Derrick [name], male    DOB: Sep 27, 1950, 61 y.o.   MRN: [id number]  HPI Respiratory tract infection Onset/symptoms:7 days ago as rhinitis Exposures (illness/environmental/extrinsic):no Progression of symptoms:to headcongestion Treatments/response:Zicam w/o help Present symptoms: Fever/chills/sweats:no Frontal headache:no Facial pain:no Nasal purulence:clear Sore throat:no Dental pain:no Lymphadenopathy:no Wheezing/shortness of breath:no Cough/sputum/hemoptysis:no Associated extrinsic/allergic symptoms:itchy eyes/ sneezing:minor sneezing Past medical history: Seasonal allergies/asthma:no Smoking history:quit 1978           Review of Systems     Objective:   Physical Exam General appearance:good health ;well nourished; no acute distress or increased work of breathing is present.  No  lymphadenopathy about the head, neck, or axilla noted.   Eyes: No conjunctival inflammation or lid edema is present.   Ears:  External ear exam shows no significant lesions or deformities.  Otoscopic examination reveals clear canals, tympanic membranes are intact bilaterally without bulging, retraction, inflammation or discharge.  Nose:  External nasal examination shows no deformity or inflammation. Nasal mucosa are pink and moist without lesions or exudates. No septal dislocation or deviation.decreased airflow.  Hyponasal speech  Oral exam: Dental hygiene is good; lips and gums are healthy appearing.There is no oropharyngeal erythema or exudate noted.     Heart:  Normal rate and regular rhythm. S1 and S2 normal without gallop, murmur, click, rub .S 4 Lungs:Chest clear to auscultation; no wheezes, rhonchi,rales ,or rubs present.No increased work of breathing.    Extremities:  No cyanosis, edema, or clubbing  noted    Skin: Warm & dry           Assessment & Plan:   #1 upper respiratory tract infection; history suggest rhinosinusitis without adequate  drainage.  Plan: See orders and recommendations.

## 2011-06-19 ENCOUNTER — Encounter: Payer: Self-pay | Admitting: Internal Medicine

## 2011-06-19 ENCOUNTER — Ambulatory Visit (INDEPENDENT_AMBULATORY_CARE_PROVIDER_SITE_OTHER): Payer: Managed Care, Other (non HMO) | Admitting: Internal Medicine

## 2011-06-19 VITALS — BP 130/90 | HR 114 | Temp 98.1°F | Resp 12 | Ht 67.5 in | Wt 256.2 lb

## 2011-06-19 DIAGNOSIS — Z0000 Encounter for general adult medical examination without abnormal findings: Secondary | ICD-10-CM

## 2011-06-19 DIAGNOSIS — M546 Pain in thoracic spine: Secondary | ICD-10-CM

## 2011-06-19 DIAGNOSIS — E785 Hyperlipidemia, unspecified: Secondary | ICD-10-CM

## 2011-06-19 DIAGNOSIS — J310 Chronic rhinitis: Secondary | ICD-10-CM

## 2011-06-19 MED ORDER — MONTELUKAST SODIUM 10 MG PO TABS: 10.0000 mg | ORAL_TABLET | Freq: Every day | ORAL | Status: DC

## 2011-06-19 MED ORDER — TRAMADOL HCL 50 MG PO TABS: 50.0000 mg | ORAL_TABLET | Freq: Four times a day (QID) | ORAL | Status: AC | PRN

## 2011-06-19 NOTE — Patient Instructions (Addendum)
Preventive Health Care: Exercise at least 30-45 minutes a day,  3-4 days a week.  The most common cause of elevated triglycerides is the ingestion of sugar from high fructose corn syrup sources added to processed foods & drinks.  Eat a low-fat diet with lots of fruits and vegetables, up to 7-9 servings per day. Consume less than 40 ( preferably ZERO)  grams of sugar per day from foods & drinks with High Fructose Corn Syrup (HFCS) sugar as #1,2,3 or # 4 on label.Whole Foods, [employer] & [employer] do not carry products with HFCS. Follow a  low carb nutrition program such as [place] or The New Sugar Busters  to prevent Diabetes progression . White carbohydrates (potatoes, rice, bread, and pasta) have a high spike of sugar and a high load of sugar. For example a  baked potato has a cup of sugar and a  french fry  2 teaspoons of sugar. Yams, wild  rice, whole grained bread &  wheat pasta have been much lower spike and load of  sugar. Portions should be the size of a deck of cards or your palm.  The best exercises for the low back include freestyle swimming, stretch aerobics, and yoga.  Health Care Power of Attorney & Living Will. Complete if not in place ; these place you in charge of your health care decisions. Please  schedule fasting Labs : BMET,Lipids, hepatic panel, CBC & dif, TSH, PSA. PLEASE BRING THESE INSTRUCTIONS TO FOLLOW UP  LAB APPOINTMENT.This will guarantee correct labs are drawn, eliminating need for repeat blood sampling ( needle sticks ! ). Diagnoses /Codes: V70.0

## 2011-06-19 NOTE — Progress Notes (Signed)
Subjective:    Patient ID: Derrick [name], male    DOB: 16-Oct-1950, 61 y.o.   MRN: [id number]  HPI  Derrick [name]  is here for a physical;acute issues include residual rhinitis & nasal congestion  symptoms. He has some sneezing; he denies frontal headache, facial pain, nasal purulence, or purulent sputum production.     Review of Systems Patient reports no  vision/ hearing changes,anorexia, weight change, fever ,adenopathy, persistant / recurrent hoarseness,  chest pain,palpitations, edema,persistant / recurrent cough, hemoptysis, dyspnea(rest, exertional, paroxysmal nocturnal), gastrointestinal  bleeding (melena, rectal bleeding), abdominal pain, excessive heart burn, GU symptoms( dysuria, hematuria, pyuria, voiding/incontinence  issues) syncope, focal weakness, memory loss,numbness & tingling, skin/hair/nail changes,depression, anxiety, abnormal bruising/bleeding,or  musculoskeletal symptoms/signs.   Rarely he'll have some dysphagia with foods such as beef tips.     Objective:   Physical Exam Gen.: Healthy and well-nourished in appearance. Alert, appropriate and cooperative throughout exam. Head: Normocephalic without obvious abnormalities Eyes: No corneal or conjunctival inflammation noted. Pupils equal round reactive to light and accommodation. Fundal exam is benign without hemorrhages, exudate, papilledema. Extraocular motion intact. Vision grossly normal. Ears: External  ear exam reveals no significant lesions or deformities. Canals clear .TMs normal. Hearing is grossly decreased to whisper @ 6 ft bilaterally. Nose: External nasal exam reveals no deformity or inflammation. Nasal mucosa are pink and moist. No lesions or exudates noted. Septum  Minimally deviated to R Mouth: Oral mucosa and oropharynx reveal no lesions or exudates. Teeth in good repair. Neck: No deformities, masses, or tenderness noted. Range of motion & Thyroid normal Lungs: Normal respiratory effort; chest expands  symmetrically. Lungs are clear to auscultation without rales, wheezes, or increased work of breathing. Heart: Normal rate and rhythm. Normal S1 and S2. No gallop, click, or rub.S 4 w/o  murmur. Abdomen: Bowel sounds normal; abdomen soft and nontender. No masses, organomegaly or hernias noted. Genitalia/ DRE: Genital exam is unremarkable. The left lobe of prostate is normal. The right is upper limits of normal without nodularity or induration.                                                                                  Musculoskeletal/extremities: loss of normal lordosis of  the thoracic  spine. No clubbing, cyanosis, edema, or deformity noted. Range of motion  normal .Tone & strength  normal.Joints normal. Nail health  good. Vascular: Carotid, radial artery, dorsalis pedis and  posterior tibial pulses are full and equal. No bruits present. Neurologic: Alert and oriented x3. Deep tendon reflexes symmetrical and normal.         kin: Intact without suspicious lesions or rashes. Lymph: No cervical, axillary, or inguinal lymphadenopathy present. Psych: Mood and affect are normal. Normally interactive  Assessment & Plan:  #1 comprehensive physical exam; no acute findings #2 see Problem List with Assessments & Recommendations Plan: see Orders   EKG reveals borderline short PR interval. He has no palpitations or other cardiovascular symptoms. No ischemic changes present.

## 2011-06-20 ENCOUNTER — Other Ambulatory Visit: Payer: Self-pay | Admitting: Internal Medicine

## 2011-06-20 DIAGNOSIS — Z0000 Encounter for general adult medical examination without abnormal findings: Secondary | ICD-10-CM

## 2011-06-21 ENCOUNTER — Telehealth: Payer: Self-pay

## 2011-06-21 ENCOUNTER — Other Ambulatory Visit (INDEPENDENT_AMBULATORY_CARE_PROVIDER_SITE_OTHER): Payer: Managed Care, Other (non HMO)

## 2011-06-21 DIAGNOSIS — Z0000 Encounter for general adult medical examination without abnormal findings: Secondary | ICD-10-CM

## 2011-06-21 LAB — LIPID PANEL: HDL: 49 mg/dL (ref >39.00)

## 2011-06-21 LAB — BASIC METABOLIC PANEL
BUN: 17 mg/dL (ref 6–23)
CO2: 28 mEq/L (ref 19–32)
Calcium: 9.5 mg/dL (ref 8.4–10.5)
Creatinine, Ser: 1.1 mg/dL (ref 0.4–1.5)
Glucose, Bld: 92 mg/dL (ref 70–99)
Sodium: 140 mEq/L (ref 135–145)

## 2011-06-21 LAB — CBC WITH DIFFERENTIAL/PLATELET
Basophils Absolute: 0.1 10*3/uL (ref 0.0–0.1)
Eosinophils Relative: 2.1 % (ref 0.0–5.0)
HCT: 51.9 % (ref 39.0–52.0)
Hemoglobin: 17.4 g/dL — ABNORMAL HIGH (ref 13.0–17.0)
Lymphs Abs: 2.1 10*3/uL (ref 0.7–4.0)
MCV: 88.9 fl (ref 78.0–100.0)
Monocytes Absolute: 0.7 10*3/uL (ref 0.1–1.0)
Neutro Abs: 4.1 10*3/uL (ref 1.4–7.7)
Platelets: 175 10*3/uL (ref 150.0–400.0)
RDW: 14.9 % — ABNORMAL HIGH (ref 11.5–14.6)

## 2011-06-21 LAB — HEPATIC FUNCTION PANEL
AST: 24 U/L (ref 0–37)
Alkaline Phosphatase: 66 U/L (ref 39–117)
Total Bilirubin: 0.7 mg/dL (ref 0.3–1.2)

## 2011-06-21 LAB — TSH: TSH: 1.16 u[IU]/mL (ref 0.35–5.50)

## 2011-06-21 NOTE — Telephone Encounter (Signed)
Patient states tramadol not helping, patient has rx for hydrocodone 5-325 mg 1-2 by mouth every 4 hours as needed (prescribed by another doctor a long time ago). Patient would like to know if Dr.Hopper could rx a stronger med.  Dr.Hopper please advise

## 2011-06-21 NOTE — Progress Notes (Signed)
LABS ONLY  

## 2011-06-21 NOTE — Telephone Encounter (Signed)
Patient was wondering if he could have a higher dose of Hydrocodone, Dr.Hopper please advise

## 2011-06-21 NOTE — Telephone Encounter (Signed)
#   30 ; 1 every 6-8 hrs prn. If the pain requires a narcotic on a frequent basis; I would like a NON operative back specialist such as Dr.Ramos or Dr Maurice Small to see you to define optimal therapy. Expected adverse effects of narcotics are  Bladder, bowel (severe constipation) & prostate dysfunction .

## 2011-06-22 NOTE — Telephone Encounter (Signed)
If this dose doesn't control symptoms; I want to get one of the specialists to see him. They're too many adverse effects with high dose hydrocodone.

## 2011-06-22 NOTE — Telephone Encounter (Signed)
Tried to call Pt phone not working, tried home phone # just rang with no response or VM will try again.

## 2011-06-28 NOTE — Telephone Encounter (Signed)
Left message to call office on work #. Mobile # not in service and home phone just rang.

## 2011-06-29 NOTE — Telephone Encounter (Signed)
Called patient on home number, no answer (unable to leave message), called patient on work # Left message for patient to call back when available

## 2011-07-04 NOTE — Telephone Encounter (Signed)
Left message for patient to return call.

## 2011-07-10 NOTE — Telephone Encounter (Signed)
Call never returned, letter mailed to patient for him to call or schedule OV if he prefers to discuss this matter

## 2011-07-18 ENCOUNTER — Telehealth: Payer: Self-pay | Admitting: Internal Medicine

## 2011-07-18 NOTE — Telephone Encounter (Signed)
Returned patient's call.  Informed patient that the reason they sent him a letter as he wasn't returning the call regarding pain management and explained that is what Dr. Alwyn Ren wanted to discuss with him.  If the pain medication wasn't working he wanted to refer him to see a specialist.  Patient said "Ok" and hung up. He didn't indicate any desire to move on with the referral at this time.

## 2011-07-18 NOTE — Telephone Encounter (Signed)
Patient called stating he received a letter stating to call the office. Please call him back @ [phone]. Thank You  Judeth Cornfield

## 2011-07-20 ENCOUNTER — Other Ambulatory Visit: Payer: Self-pay | Admitting: Internal Medicine

## 2011-07-24 ENCOUNTER — Ambulatory Visit: Payer: Managed Care, Other (non HMO) | Admitting: Family Medicine

## 2011-07-25 ENCOUNTER — Encounter: Payer: Self-pay | Admitting: Family Medicine

## 2011-07-25 ENCOUNTER — Ambulatory Visit (INDEPENDENT_AMBULATORY_CARE_PROVIDER_SITE_OTHER): Payer: Managed Care, Other (non HMO) | Admitting: Family Medicine

## 2011-07-25 VITALS — BP 146/104 | HR 104 | Temp 98.6°F | Ht 64.5 in | Wt 240.0 lb

## 2011-07-25 DIAGNOSIS — R030 Elevated blood-pressure reading, without diagnosis of hypertension: Secondary | ICD-10-CM

## 2011-07-25 NOTE — Progress Notes (Signed)
OFFICE VISIT  07/30/2011   CC:  Chief Complaint  Patient presents with  . Establish Care    discuss cholesterol     HPI:    Patient is a 61 y.o. Caucasian male who presents as transfer pt from Dr. Alwyn Ren b/c of convenience of our office to his home. Has hx of some elevated bp's in MD office, rarely checks it outside of medical setting but recalls normal readings when he has done so in the past.  Has never been on bp med before.  Past Medical History  Diagnosis Date  . Chronic osteomyelitis     Staph infection post lumbar surgery (surgery X 3)  . Hyperlipidemia   . BPH (benign prostatic hypertrophy)     Past Surgical History  Procedure Date  . Tonsillectomy   . Colonoscopy w/ polypectomy 2007 & 2012    Negative 2012; Dr Randa Evens  . Lumbar laminectomy     Dr Venetia Maxon performed 2nd & 3rd procedures (pt also has hx of fall and vertebral fracture years prior to his surgeries.    Outpatient Prescriptions Prior to Visit  Medication Sig Dispense Refill  . aspirin 81 MG tablet Take 160 mg by mouth daily.      . cholecalciferol (VITAMIN D) 1000 UNITS tablet Take 1,000 Units by mouth daily.      . Tamsulosin HCl (FLOMAX) 0.4 MG CAPS TAKE ONE CAPSULE BY MOUTH EVERY DAY  90 capsule  2  . fluticasone (FLONASE) 50 MCG/ACT nasal spray Place 1 spray into the nose 2 (two) times daily as needed for rhinitis.  16 g  2  . montelukast (SINGULAIR) 10 MG tablet Take 1 tablet (10 mg total) by mouth at bedtime.  30 tablet  3    No Known Allergies  ROS As per HPI  PE: Blood pressure 146/104, pulse 104, temperature 98.6 F (37 C), temperature source Temporal, height 5' 4.5" (1.638 m), weight 240 lb (108.863 kg), SpO2 95.00%. Gen: Alert, well appearing.  Patient is oriented to person, place, time, and situation. CV: RRR, no m/r/g.   LUNGS: CTA bilat, nonlabored resps, good aeration in all lung fields. Neck - No masses or thyromegaly or limitation in range of motion.  Carotids 2+ bilat, no  bruits.   LABS:  None today  Lab Results  Component Value Date   TSH 1.16 06/21/2011   Lab Results  Component Value Date   WBC 7.1 06/21/2011   HGB 17.4* 06/21/2011   HCT 51.9 06/21/2011   MCV 88.9 06/21/2011   PLT 175.0 06/21/2011   Lab Results  Component Value Date   CREATININE 1.1 06/21/2011   BUN 17 06/21/2011   NA 140 06/21/2011   K 4.9 06/21/2011   CL 102 06/21/2011   CO2 28 06/21/2011   Lab Results  Component Value Date   ALT 25 06/21/2011   AST 24 06/21/2011   ALKPHOS 66 06/21/2011   BILITOT 0.7 06/21/2011   Lab Results  Component Value Date   CHOL 202* 06/21/2011   Lab Results  Component Value Date   HDL 49.00 06/21/2011   Lab Results  Component Value Date   LDLCALC 118* 06/21/2010   Lab Results  Component Value Date   TRIG 106.0 06/21/2011   Lab Results  Component Value Date   CHOLHDL 4 06/21/2011   Lab Results  Component Value Date   PSA 1.95 06/21/2011   PSA 1.91 06/21/2010   PSA 1.53 06/25/2008   IMPRESSION AND PLAN:  Transfer pt: spent  20 min reviewing/discussing med history today.  ELEVATED BLOOD PRESSURE WITHOUT DIAGNOSIS OF HYPERTENSION Discussed more regular home monitoring. Discussed DASH diet and gave handout for this today. MUST increase exercise: ultimate goal of of cardio 5/7 days per week. Goal of 20 lb wt loss over next 9-12 mo.   Zostavax discussed.  He wants to hold off for now.  FOLLOW UP: Return in about 1 year (around 07/24/2012) for f/u 1 yr for CPE.

## 2011-07-30 NOTE — Assessment & Plan Note (Signed)
Discussed more regular home monitoring. Discussed DASH diet and gave handout for this today. MUST increase exercise: ultimate goal of of cardio 5/7 days per week. Goal of 20 lb wt loss over next 9-12 mo.

## 2012-01-01 ENCOUNTER — Ambulatory Visit (INDEPENDENT_AMBULATORY_CARE_PROVIDER_SITE_OTHER): Payer: Managed Care, Other (non HMO) | Admitting: Family Medicine

## 2012-01-01 ENCOUNTER — Encounter: Payer: Self-pay | Admitting: Family Medicine

## 2012-01-01 VITALS — BP 158/107 | HR 94 | Temp 96.6°F | Ht 69.0 in | Wt 232.0 lb

## 2012-01-01 DIAGNOSIS — M79609 Pain in unspecified limb: Secondary | ICD-10-CM

## 2012-01-01 DIAGNOSIS — I10 Essential (primary) hypertension: Secondary | ICD-10-CM

## 2012-01-01 DIAGNOSIS — M79672 Pain in left foot: Secondary | ICD-10-CM

## 2012-01-01 MED ORDER — LISINOPRIL 5 MG PO TABS: 5.0000 mg | ORAL_TABLET | Freq: Every day | ORAL | Status: DC

## 2012-01-01 NOTE — Progress Notes (Signed)
OFFICE NOTE  01/01/2012  CC:  Chief Complaint  Patient presents with  . Foot Pain    Lt foot pain. Onset 3 weeks ago.  Walking on beach.     HPI: Patient is a 61 y.o. Caucasian male who is here for foot pain. Describes mild/mod pain after taking long walk on beach recently, has hurt for a couple of weeks and just in the last 1-2 days has almost completely resolved.  He points to the area between 2nd ad 3rd toes on plantar surface of left foot, says he could feel a little nodularity deep in that area at one time.  No erythema, no skin opening, no recent injury, no warmth, no foot deformity.  Also, his BP is noted to be up again today.  We have had conversations about this at a past visit and he has been averse to starting meds.  He expresses that he is "falling apart" if he has to take bp med--a lot of psychological block regarding this.  Has FH of HTN (brother and parents).    Pertinent PMH:  Past Medical History  Diagnosis Date  . Chronic osteomyelitis     Staph infection post lumbar surgery (surgery X 3)  . Hyperlipidemia   . BPH (benign prostatic hypertrophy)   No hx of gout Hx of elevated bp without dx of HTN  MEDS:  Outpatient Prescriptions Prior to Visit  Medication Sig Dispense Refill  . aspirin 81 MG tablet Take 160 mg by mouth daily.      . cholecalciferol (VITAMIN D) 1000 UNITS tablet Take 1,000 Units by mouth daily.      . Tamsulosin HCl (FLOMAX) 0.4 MG CAPS TAKE ONE CAPSULE BY MOUTH EVERY DAY  90 capsule  2  . fluticasone (FLONASE) 50 MCG/ACT nasal spray Place 1 spray into the nose 2 (two) times daily as needed for rhinitis.  16 g  2  . montelukast (SINGULAIR) 10 MG tablet Take 1 tablet (10 mg total) by mouth at bedtime.  30 tablet  3  . Potassium (POTASSIMIN PO) Take 1 tablet by mouth daily.        PE: Blood pressure 158/107, pulse 94, temperature 96.6 F (35.9 C), height 5\' 9"  (1.753 m), weight 232 lb (105.235 kg), SpO2 94.00%.  BP recheck by me in room: right  arm 154/98, left arm 152/98. Gen: Alert, well appearing.  Patient is oriented to person, place, time, and situation. CV: RRR, no m/r/g.   LUNGS: CTA bilat, nonlabored resps, good aeration in all lung fields. Left foot: no deformity, no erythema, no swelling.  He has minimal TTP in soft tissue area between the heads of 2nd and 3rd metatarsal bones.  No palpable lesion/nodule.    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  This seems to be resolving.  No further recommendations about this right now.  HTN (hypertension), benign Discussed this problem in depth today.  He does not want to accept the fact that he has this dx. I encouraged him to accept treatment b/c of the need to decrease his risk of stroke, CAD, renal insufficiency. He grudgingly accepted this, lisinopril 5mg  qd to start.  Also encouraged increase exercise and limitation of Na.   FOLLOW UP: 1 mo

## 2012-01-02 ENCOUNTER — Telehealth: Payer: Self-pay | Admitting: *Deleted

## 2012-01-02 NOTE — Telephone Encounter (Signed)
The patient called today for me to ask Dr. Milinda Cave if the medication for high blood pressure, ( Lisinopril) Prinivil, Zestril, is an approved medication with the FAA.  He was talking to an examiner today.  He has a physical with them coming up.  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

## 2012-01-04 DIAGNOSIS — M79672 Pain in left foot: Secondary | ICD-10-CM | POA: Insufficient documentation

## 2012-01-04 NOTE — Telephone Encounter (Signed)
I don't know if it is approved, but I am pretty sure the FAA only lists controlled substances as the ones that are "not approved".  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

## 2012-01-04 NOTE — Telephone Encounter (Signed)
I tried to inform patient but he kept stating that he's not talking about controlled substances he is talking about BP medication and I'm not listening to him. I tried to explain that we are listening and reread what dr Milinda Cave said but Derrick [name] states he will go another route to find out.

## 2012-01-04 NOTE — Assessment & Plan Note (Signed)
Discussed this problem in depth today.  He does not want to accept the fact that he has this dx. I encouraged him to accept treatment b/c of the need to decrease his risk of stroke, CAD, renal insufficiency. He grudgingly accepted this, lisinopril 5mg  qd to start.  Also encouraged increase exercise and limitation of Na.

## 2012-01-04 NOTE — Assessment & Plan Note (Signed)
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  This seems to be resolving.  No further recommendations about this right now.

## 2012-01-09 ENCOUNTER — Ambulatory Visit (INDEPENDENT_AMBULATORY_CARE_PROVIDER_SITE_OTHER): Payer: Managed Care, Other (non HMO) | Admitting: *Deleted

## 2012-01-09 VITALS — BP 145/103 | HR 85

## 2012-01-09 DIAGNOSIS — I10 Essential (primary) hypertension: Secondary | ICD-10-CM

## 2012-01-09 NOTE — Progress Notes (Signed)
Pt presented for BP check.  He took first pill last night.  Pt took BP at [facility] earlier this PM.  BP twice in left arm was 135/84 P 70, and 133/88 P 60.  Pt wanted to come to our office to have pressure checked.  Advised pt that BP is still elevated and he should continue to take medication.  Advised to give it time.  Pt is thankful we had time to take his blood pressure today.

## 2012-02-05 ENCOUNTER — Encounter: Payer: Self-pay | Admitting: Family Medicine

## 2012-02-05 ENCOUNTER — Ambulatory Visit (INDEPENDENT_AMBULATORY_CARE_PROVIDER_SITE_OTHER): Payer: Managed Care, Other (non HMO) | Admitting: Family Medicine

## 2012-02-05 VITALS — BP 149/99 | HR 92 | Ht 69.0 in | Wt 233.0 lb

## 2012-02-05 DIAGNOSIS — M79672 Pain in left foot: Secondary | ICD-10-CM

## 2012-02-05 DIAGNOSIS — M79609 Pain in unspecified limb: Secondary | ICD-10-CM

## 2012-02-05 DIAGNOSIS — I10 Essential (primary) hypertension: Secondary | ICD-10-CM

## 2012-02-05 MED ORDER — LOSARTAN POTASSIUM 50 MG PO TABS: 50.0000 mg | ORAL_TABLET | Freq: Every day | ORAL | Status: DC

## 2012-02-05 NOTE — Progress Notes (Signed)
OFFICE NOTE  02/05/2012  CC:  Chief Complaint  Patient presents with  . Follow-up    HTN     HPI: Patient is a 61 y.o. Caucasian male who is here for f/u HTN.  Has been on 5mg  lisinopril for about 3 wks, notes dry/hacky/tickly cough.  Does not monitor bp at home but has gone to a pharmacy on few occasions and it has been near normal.  No CP, no palpitations, no SOB, no dizziness, no focal or generalized weakness, no myalgias. Still having foot pain: bottom of left foot x 16mo, near metatarsal heads of 2nd and 3rd digits.    Pertinent PMH:  Past Medical History  Diagnosis Date  . Chronic osteomyelitis     Staph infection post lumbar surgery (surgery X 3)  . Hyperlipidemia   . BPH (benign prostatic hypertrophy)    Past surgical, social, and family history reviewed and no changes noted since last office visit.  MEDS:  Outpatient Prescriptions Prior to Visit  Medication Sig Dispense Refill  . aspirin 81 MG tablet Take 160 mg by mouth daily.      . cholecalciferol (VITAMIN D) 1000 UNITS tablet Take 1,000 Units by mouth daily.      . Tamsulosin HCl (FLOMAX) 0.4 MG CAPS TAKE ONE CAPSULE BY MOUTH EVERY DAY  90 capsule  2  . lisinopril (PRINIVIL,ZESTRIL) 5 MG tablet Take 1 tablet (5 mg total) by mouth daily.  30 tablet  3  . fluticasone (FLONASE) 50 MCG/ACT nasal spray Place 1 spray into the nose 2 (two) times daily as needed for rhinitis.  16 g  2  . montelukast (SINGULAIR) 10 MG tablet Take 1 tablet (10 mg total) by mouth at bedtime.  30 tablet  3  . Potassium (POTASSIMIN PO) Take 1 tablet by mouth daily.        PE: Blood pressure 149/99, pulse 92, height 5\' 9"  (1.753 m), weight 233 lb (105.688 kg). Gen: Alert, well appearing.  Patient is oriented to person, place, time, and situation. CV: RRR, no m/r/g.   LUNGS: CTA bilat, nonlabored resps, good aeration in all lung fields. Left foot: metatarsal head region mildly TTP between 2nd and 3rd toes.  No mass or erythema.  IMPRESSION  AND PLAN:  HTN (hypertension), benign Improving.  We'll continue to take things slow with him. Switch to ARB since the ACE-I is giving him the classic cough. Sent rx for losartan 50mg  qd, #30, RF x 3. F/u 6 wks, check BMET at that time.  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 Encouraged pt to get some metatarsal pads today.     FOLLOW UP: 6 wks

## 2012-02-05 NOTE — Assessment & Plan Note (Signed)
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 Encouraged pt to get some metatarsal pads today.

## 2012-02-05 NOTE — Assessment & Plan Note (Signed)
Improving.  We'll continue to take things slow with him. Switch to ARB since the ACE-I is giving him the classic cough. Sent rx for losartan 50mg  qd, #30, RF x 3. F/u 6 wks, check BMET at that time.

## 2012-02-05 NOTE — Patient Instructions (Signed)
Get metatarsal pads at any pharmacy or shoe store or department store.

## 2012-03-20 ENCOUNTER — Encounter: Payer: Self-pay | Admitting: Family Medicine

## 2012-03-20 ENCOUNTER — Ambulatory Visit (INDEPENDENT_AMBULATORY_CARE_PROVIDER_SITE_OTHER): Payer: Managed Care, Other (non HMO) | Admitting: Family Medicine

## 2012-03-20 VITALS — BP 128/87 | HR 97 | Ht 69.0 in | Wt 235.0 lb

## 2012-03-20 DIAGNOSIS — I10 Essential (primary) hypertension: Secondary | ICD-10-CM

## 2012-03-20 NOTE — Assessment & Plan Note (Signed)
Needs to monitor bp more: the two measurements he has for me are high but today's is normal.   He agrees to buy a home bp monitor and check daily for a couple of weeks at least, then we'll determine his average and go from there. I encouraged him to cut back on caffeine slowly. Continue med. F/u 05/2012 for CPE and come in for health panel (fasting) the week prior.

## 2012-03-20 NOTE — Progress Notes (Signed)
OFFICE NOTE  03/20/2012  CC:  Chief Complaint  Patient presents with  . Follow-up    HTN     HPI: Patient is a 61 y.o. Caucasian male who is here for 6 wk f/u HTN.   Tolerating med without problem. He has only 2 bp numbers for me to look at and these are from about 2 wks ago (checked it at pharmacy): 150s-160s systolic over 100s diastolic.    He tells me he has been having palpitatons at rest over the last year or so, they last about 1 hr and cause no sx's such as dizziness, chest pain, or SOB.  He says that he has not had this for about the last 10-14d.  Prior to that it was almost daily.  He has never had this evaluated before. He just stopped caffeine yesterday.  He wonders if the normal bp today is a result of this or not.  Pertinent PMH:  Past Medical History  Diagnosis Date  . Chronic osteomyelitis     Staph infection post lumbar surgery (surgery X 3)  . Hyperlipidemia   . BPH (benign prostatic hypertrophy)     MEDS:  Outpatient Prescriptions Prior to Visit  Medication Sig Dispense Refill  . aspirin 81 MG tablet Take 160 mg by mouth daily.      . cholecalciferol (VITAMIN D) 1000 UNITS tablet Take 1,000 Units by mouth daily.      [name] losartan (COZAAR) 50 MG tablet Take 1 tablet (50 mg total) by mouth daily.  30 tablet  3  . Tamsulosin HCl (FLOMAX) 0.4 MG CAPS TAKE ONE CAPSULE BY MOUTH EVERY DAY  90 capsule  2    PE: Blood pressure 128/87, pulse 97, height 5\' 9"  (1.753 m), weight 235 lb (106.595 kg). Gen: Alert, well appearing.  Patient is oriented to person, place, time, and situation. CV: RRR, no m/r/g.   LUNGS: CTA bilat, nonlabored resps, good aeration in all lung fields. EXT: no clubbing, cyanosis, or edema.   IMPRESSION AND PLAN:  HTN (hypertension), benign Needs to monitor bp more: the two measurements he has for me are high but today's is normal.   He agrees to buy a home bp monitor and check daily for a couple of weeks at least, then we'll determine his  average and go from there. I encouraged him to cut back on caffeine slowly. Continue med. F/u 05/2012 for CPE and come in for health panel (fasting) the week prior.   An After Visit Summary was printed and given to the patient.  FOLLOW UP: as above

## 2012-05-05 ENCOUNTER — Other Ambulatory Visit: Payer: Self-pay | Admitting: Internal Medicine

## 2012-05-07 ENCOUNTER — Telehealth: Payer: Self-pay | Admitting: Family Medicine

## 2012-05-07 NOTE — Telephone Encounter (Signed)
RX has been sent through [facility] encounter earlier today.

## 2012-05-07 NOTE — Telephone Encounter (Signed)
eScribe request for refill on TAMSULOSIN Last filled - 07/20/11, #90 X 2 Last seen on - 03/20/12 Follow up - 05/2012 for CPE RX sent per protocol.

## 2012-06-17 ENCOUNTER — Other Ambulatory Visit: Payer: Self-pay | Admitting: *Deleted

## 2012-06-17 MED ORDER — LOSARTAN POTASSIUM 50 MG PO TABS: 50.0000 mg | ORAL_TABLET | Freq: Every day | ORAL | Status: DC

## 2012-06-17 NOTE — Telephone Encounter (Signed)
Faxed refill request received from pharmacy for LOSARTAN Last filled by MD on 02/05/12, #30 X 3 Last seen on 03/20/12 Follow up CPE 06/26/12, labs on 06/19/12 Refill sent per [facility] refill protocol.

## 2012-06-19 ENCOUNTER — Other Ambulatory Visit (INDEPENDENT_AMBULATORY_CARE_PROVIDER_SITE_OTHER): Payer: BC Managed Care – PPO

## 2012-06-19 DIAGNOSIS — Z0000 Encounter for general adult medical examination without abnormal findings: Secondary | ICD-10-CM

## 2012-06-19 LAB — RENAL FUNCTION PANEL
Albumin: 4.1 g/dL (ref 3.5–5.2)
BUN: 15 mg/dL (ref 6–23)
Chloride: 102 mEq/L (ref 96–112)
Phosphorus: 2.6 mg/dL (ref 2.3–4.6)

## 2012-06-19 LAB — CBC
MCHC: 33.5 g/dL (ref 30.0–36.0)
Platelets: 172 10*3/uL (ref 150.0–400.0)
RDW: 14.6 % (ref 11.5–14.6)

## 2012-06-19 LAB — HEPATIC FUNCTION PANEL
ALT: 20 U/L (ref 0–53)
Alkaline Phosphatase: 64 U/L (ref 39–117)
Bilirubin, Direct: 0 mg/dL (ref 0.0–0.3)
Total Protein: 7.5 g/dL (ref 6.0–8.3)

## 2012-06-19 LAB — TSH: TSH: 0.75 u[IU]/mL (ref 0.35–5.50)

## 2012-06-19 LAB — LIPID PANEL
HDL: 39.3 mg/dL (ref >39.00)
LDL Cholesterol: 132 mg/dL — ABNORMAL HIGH (ref 0–99)
Total CHOL/HDL Ratio: 5

## 2012-06-19 NOTE — Progress Notes (Signed)
Labs only

## 2012-06-26 ENCOUNTER — Ambulatory Visit (INDEPENDENT_AMBULATORY_CARE_PROVIDER_SITE_OTHER): Payer: BC Managed Care – PPO | Admitting: Family Medicine

## 2012-06-26 ENCOUNTER — Encounter: Payer: Self-pay | Admitting: Family Medicine

## 2012-06-26 VITALS — BP 155/102 | HR 90 | Temp 98.3°F | Ht 69.0 in | Wt 249.0 lb

## 2012-06-26 DIAGNOSIS — E669 Obesity, unspecified: Secondary | ICD-10-CM

## 2012-06-26 DIAGNOSIS — I10 Essential (primary) hypertension: Secondary | ICD-10-CM

## 2012-06-26 DIAGNOSIS — Z86010 Personal history of colonic polyps: Secondary | ICD-10-CM

## 2012-06-26 DIAGNOSIS — Z0000 Encounter for general adult medical examination without abnormal findings: Secondary | ICD-10-CM

## 2012-06-26 DIAGNOSIS — J209 Acute bronchitis, unspecified: Secondary | ICD-10-CM

## 2012-06-26 DIAGNOSIS — E785 Hyperlipidemia, unspecified: Secondary | ICD-10-CM

## 2012-06-26 DIAGNOSIS — N400 Enlarged prostate without lower urinary tract symptoms: Secondary | ICD-10-CM

## 2012-06-26 MED ORDER — ZOSTER VACCINE LIVE 19400 UNT/0.65ML ~~LOC~~ SOLR: 0.6500 mL | Freq: Once | SUBCUTANEOUS | Status: DC

## 2012-06-26 MED ORDER — LOSARTAN POTASSIUM 100 MG PO TABS: 100.0000 mg | ORAL_TABLET | Freq: Every day | ORAL | Status: DC

## 2012-06-26 NOTE — Patient Instructions (Signed)
Health Maintenance, Males A healthy lifestyle and preventative care can promote health and wellness.  Maintain regular health, dental, and eye exams.  Eat a healthy diet. Foods like vegetables, fruits, whole grains, low-fat dairy products, and lean protein foods contain the nutrients you need without too many calories. Decrease your intake of foods high in solid fats, added sugars, and salt. Get information about a proper diet from your caregiver, if necessary.  Regular physical exercise is one of the most important things you can do for your health. Most adults should get at least 150 minutes of moderate-intensity exercise (any activity that increases your heart rate and causes you to sweat) each week. In addition, most adults need muscle-strengthening exercises on 2 or more days a week.   Maintain a healthy weight. The body mass index (BMI) is a screening tool to identify possible weight problems. It provides an estimate of body fat based on height and weight. Your caregiver can help determine your BMI, and can help you achieve or maintain a healthy weight. For adults 20 years and older:  A BMI below 18.5 is considered underweight.  A BMI of 18.5 to 24.9 is normal.  A BMI of 25 to 29.9 is considered overweight.  A BMI of 30 and above is considered obese.  Maintain normal blood lipids and cholesterol by exercising and minimizing your intake of saturated fat. Eat a balanced diet with plenty of fruits and vegetables. Blood tests for lipids and cholesterol should begin at age 20 and be repeated every 5 years. If your lipid or cholesterol levels are high, you are over 50, or you are a high risk for heart disease, you may need your cholesterol levels checked more frequently.Ongoing high lipid and cholesterol levels should be treated with medicines, if diet and exercise are not effective.  If you smoke, find out from your caregiver how to quit. If you do not use tobacco, do not start.  If you  choose to drink alcohol, do not exceed 2 drinks per day. One drink is considered to be 12 ounces (355 mL) of beer, 5 ounces (148 mL) of wine, or 1.5 ounces (44 mL) of liquor.  Avoid use of street drugs. Do not share needles with anyone. Ask for help if you need support or instructions about stopping the use of drugs.  High blood pressure causes heart disease and increases the risk of stroke. Blood pressure should be checked at least every 1 to 2 years. Ongoing high blood pressure should be treated with medicines if weight loss and exercise are not effective.  If you are 45 to 64 years old, ask your caregiver if you should take aspirin to prevent heart disease.  Diabetes screening involves taking a blood sample to check your fasting blood sugar level. This should be done once every 3 years, after age 45, if you are within normal weight and without risk factors for diabetes. Testing should be considered at a younger age or be carried out more frequently if you are overweight and have at least 1 risk factor for diabetes.  Colorectal cancer can be detected and often prevented. Most routine colorectal cancer screening begins at the age of 50 and continues through age 75. However, your caregiver may recommend screening at an earlier age if you have risk factors for colon cancer. On a yearly basis, your caregiver may provide home test kits to check for hidden blood in the stool. Use of a small camera at the end of a tube,   to directly examine the colon (sigmoidoscopy or colonoscopy), can detect the earliest forms of colorectal cancer. Talk to your caregiver about this at age 50, when routine screening begins. Direct examination of the colon should be repeated every 5 to 10 years through age 75, unless early forms of pre-cancerous polyps or small growths are found.  Hepatitis C blood testing is recommended for all people born from 1945 through 1965 and any individual with known risks for hepatitis C.  Healthy  men should no longer receive prostate-specific antigen (PSA) blood tests as part of routine cancer screening. Consult with your caregiver about prostate cancer screening.  Testicular cancer screening is not recommended for adolescents or adult males who have no symptoms. Screening includes self-exam, caregiver exam, and other screening tests. Consult with your caregiver about any symptoms you have or any concerns you have about testicular cancer.  Practice safe sex. Use condoms and avoid high-risk sexual practices to reduce the spread of sexually transmitted infections (STIs).  Use sunscreen with a sun protection factor (SPF) of 30 or greater. Apply sunscreen liberally and repeatedly throughout the day. You should seek shade when your shadow is shorter than you. Protect yourself by wearing long sleeves, pants, a wide-brimmed hat, and sunglasses year round, whenever you are outdoors.  Notify your caregiver of new moles or changes in moles, especially if there is a change in shape or color. Also notify your caregiver if a mole is larger than the size of a pencil eraser.  A one-time screening for abdominal aortic aneurysm (AAA) and surgical repair of large AAAs by sound wave imaging (ultrasonography) is recommended for ages 65 to 75 years who are current or former smokers.  Stay current with your immunizations. Document Released: 11/04/2007 Document Revised: 07/31/2011 Document Reviewed: 10/03/2010 [employer] Patient Information 2013 [employer].  

## 2012-06-26 NOTE — Progress Notes (Signed)
Office Note 06/27/2012  CC:  Chief Complaint  Patient presents with  . Annual Exam    follow up HTN    HPI:  Derrick [name] is a 62 y.o. White male who is here for CPE. We reviewed his fasting lab work done recently in detail in office today.   He reports bps at home of around 140s over lower 90s most of the time, with occasional 120-130 systolic and 80s diastolic.  Compliant with med, no side effects. He brought in results of free carotid doppler and thyroid u/s screening done via his employer [employer]) 03/2012 and these were normal.   He laments about his poor self control over the last couple of months with holiday eating and sedentary behavior.    His main complaint today is a persistent cough.  He states he had a "cold/sinus" infection about 1 mo ago, with some cough associated with it "like usual".  No fevers, no SOB, no wheeze.  He says the upper resp sx's have resolved but his cough is lingering and bothers him.  It does not keep him from sleeping.  He denies any associated chest tightness, SOB, chest pain, or wheezing.  Very little phlegm production.  Does not feel any PND but admits to feeling a tickle in throat a lot.   When the prospect of possible bp med side effect was brought up today, he said "no, no, it's not the bp med.  This started way after I was started on that med".  He basically says this is the way he usually finishes up a viral cold--but he wondered if there was anything that could be or needed to be done about the ongoing coughing.  No fevers, no malaise, no body aches, no hemoptysis.  Denies GERD.    Past Medical History  Diagnosis Date  . Hyperlipidemia   . BPH (benign prostatic hypertrophy)     Urology following.  . 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  Annotation: previous methicillin sensitive  staphylococcus aureus 2006 Qualifier: Diagnosis of  By: Ninetta Lights MD, Tinnie Gens  (ID Specialist)    . History of  adenomatous polyp of colon     Dr. Randa Evens: last colonoscopy was 2012 and was normal.   5 yr recall.  [name] HTN (hypertension)     Past Surgical History  Procedure Date  . Tonsillectomy   . Colonoscopy w/ polypectomy 2007 & 2012    Negative 2012; Dr Randa Evens  . Lumbar laminectomy     Dr Venetia Maxon performed 2nd & 3rd procedures (pt also has hx of fall and vertebral fracture years prior to his surgeries.    Family History  Problem Relation Age of Onset  . Stroke Mother   . Cancer Mother 84    melanoma  . Cancer Father 40    lung cancer; [employer] (nonsmoker)  . Heart disease Maternal Grandmother   . Heart disease Maternal Grandfather   . Heart disease Paternal Grandmother   . Cancer Brother     bladder cancer    History   Social History  . Marital Status: Married    Spouse Name: N/A    Number of Children: N/A  . Years of Education: N/A   Occupational History  . Not on file.   Social History Main Topics  . Smoking status: Former Smoker    Quit date: 05/22/1973  . Smokeless tobacco: Not on file     Comment: 35 years  ago as of 2013  . Alcohol Use: No  . Drug Use: No  . Sexually Active: Not on file   Other Topics Concern  . Not on file   Social History Narrative   Married, 1 son and 2 grandchildren.Orig from [place] area, [employer].Retired Visual merchandiser (age 48).Loves fishing (ocean).  Belongs to [employer].    Outpatient Prescriptions Prior to Visit  Medication Sig Dispense Refill  . aspirin 81 MG tablet Take 160 mg by mouth daily.      . cholecalciferol (VITAMIN D) 1000 UNITS tablet Take 1,000 Units by mouth daily.      . Tamsulosin HCl (FLOMAX) 0.4 MG CAPS TAKE ONE CAPSULE BY MOUTH EVERY DAY  90 capsule  2  . [DISCONTINUED] losartan (COZAAR) 50 MG tablet Take 1 tablet (50 mg total) by mouth daily.  30 tablet  5  Last reviewed on 06/26/2012  2:54 PM by Jeoffrey Massed, MD  No Known Allergies  ROS Review of Systems  Constitutional: Negative for  fever, chills, appetite change and fatigue.  HENT: Negative for ear pain, congestion, sore throat, neck stiffness and dental problem.   Eyes: Negative for discharge, redness and visual disturbance.  Respiratory: Positive for cough (see HPI). Negative for chest tightness, shortness of breath and wheezing.   Cardiovascular: Negative for chest pain, palpitations and leg swelling.  Gastrointestinal: Negative for nausea, vomiting, abdominal pain, diarrhea and blood in stool.  Genitourinary: Negative for dysuria, urgency, frequency, hematuria, flank pain and difficulty urinating.  Musculoskeletal: Positive for back pain (chronic lower lumbar pain, mostly on right--muscle pain ever since lumbar spine surgery). Negative for myalgias, joint swelling and arthralgias.  Skin: Negative for pallor and rash.  Neurological: Negative for dizziness, speech difficulty, weakness and headaches.  Hematological: Negative for adenopathy. Does not bruise/bleed easily.  Psychiatric/Behavioral: Negative for confusion, sleep disturbance and dysphoric mood. The patient is not nervous/anxious.     PE; Blood pressure 155/102, pulse 90, temperature 98.3 F (36.8 C), temperature source Temporal, height 5\' 9"  (1.753 m), weight 249 lb (112.946 kg), SpO2 95.00%. Gen: Alert, well appearing, obese white male in NAD.  Patient is oriented to person, place, time, and situation. AFFECT: pleasant, lucid thought and speech. ENT: Ears: EACs clear, normal epithelium.  TMs with good light reflex and landmarks bilaterally.  Eyes: no injection, icteris, swelling, or exudate.  EOMI, PERRLA. Nose: mild diffuse turbinate injection and edema, no purulent d/c.  No injection or focal lesion.  Mouth: lips without lesion/swelling.  Oral mucosa pink and moist.  Dentition intact and without obvious caries or gingival swelling.  Oropharynx without erythema, exudate, or swelling.  Neck: supple/nontender.  No LAD, mass, or TM.  Carotid pulses 2+  bilaterally, without bruits. CV: RRR, no m/r/g.   LUNGS: CTA bilat, nonlabored resps, good aeration in all lung fields. ABD: soft, NT, ND, BS normal.  No hepatospenomegaly or mass.  No bruits. EXT: no clubbing, cyanosis, or edema.  Musculoskeletal: no joint swelling, erythema, warmth, or tenderness.  ROM of all joints intact.  Mild TTP in lumbar soft tissues, primarily on left side.   Skin - no sores or suspicious lesions or rashes or color changes  Pertinent labs:  Lab Results  Component Value Date   TSH 0.75 06/19/2012   Lab Results  Component Value Date   WBC 7.6 06/19/2012   HGB 17.7* 06/19/2012   HCT 52.8* 06/19/2012   MCV 89.1 06/19/2012   PLT 172.0 06/19/2012   Lab Results  Component Value Date  CREATININE 1.0 06/19/2012   BUN 15 06/19/2012   NA 137 06/19/2012   K 4.3 06/19/2012   CL 102 06/19/2012   CO2 28 06/19/2012   Lab Results  Component Value Date   ALT 20 06/19/2012   AST 16 06/19/2012   ALKPHOS 64 06/19/2012   BILITOT 0.8 06/19/2012   Lab Results  Component Value Date   CHOL 196 06/19/2012   Lab Results  Component Value Date   HDL 39.30 06/19/2012   Lab Results  Component Value Date   LDLCALC 132* 06/19/2012   Lab Results  Component Value Date   TRIG 126.0 06/19/2012   Lab Results  Component Value Date   CHOLHDL 5 06/19/2012   Lab Results  Component Value Date   PSA 1.95 06/21/2011   PSA 1.91 06/21/2010   PSA 1.53 06/25/2008       ASSESSMENT AND PLAN:   HTN (hypertension), benign Not ideal control. Increase losartan to 100mg  daily and continue to monitor bp. Return in 1 mo to review.  Acute bronchitis No sign of RAD, but will give symbicort inhaler for him to try, 2 puffs bid x 2 wks (sample given, inhaler education done by CMA). Stop cough drops and OTC cough meds.  I also recommended that he try a 1 week course of daily allegra 180mg  to see if this helped with possible PND that may be contributing to this.  He said he didn't think that was what it  was so I doubt he gets the allegra. No CXR or labs indicated at this time for this problem.   If cough unchanged in another 2 wks then he should return for recheck.  If new sx's such as SOB, wheezing, hemoptysis, fevers, etc begin then he should come in sooner.  Obesity, unspecified I strongly encouraged him to do more than walking for exercise. Discussed need to start slowly and work his way up to a goal of 20-30 min of vigorous CV exercise 5 to 7 days a week. Discussed HR goal for him of 130s during exercise.  COLONIC POLYPS, HX OF Not yet due for repeat TCS. GI will issue recall when appropriate.  BENIGN PROSTATIC HYPERTROPHY He gets regular urologic follow up, including PSA screening/DRE/genital exams, so we deferred these things today.  HYPERLIPIDEMIA Recent FLP stable/at or near goal. Again, stressed EXERCISE and PRUDENT diet as the best therapy for this at this time.  Health maintenance examination Reviewed age and gender appropriate health maintenance issues (prudent diet, regular exercise, health risks of tobacco and excessive alcohol, use of seatbelts, fire alarms in home, use of sunscreen).  Also reviewed age and gender appropriate health screening as well as vaccine recommendations. Zostavax rx printed and given to patient to take to his pharmacy for dispensation and administration.   An After Visit Summary was printed and given to the patient.  FOLLOW UP:  Return in about 1 month (around 07/24/2012) for f/u HTN.

## 2012-06-27 ENCOUNTER — Encounter: Payer: Self-pay | Admitting: Family Medicine

## 2012-06-27 DIAGNOSIS — J209 Acute bronchitis, unspecified: Secondary | ICD-10-CM | POA: Insufficient documentation

## 2012-06-27 DIAGNOSIS — Z0000 Encounter for general adult medical examination without abnormal findings: Secondary | ICD-10-CM | POA: Insufficient documentation

## 2012-06-27 DIAGNOSIS — E669 Obesity, unspecified: Secondary | ICD-10-CM | POA: Insufficient documentation

## 2012-06-27 NOTE — Assessment & Plan Note (Signed)
Recent FLP stable/at or near goal. Again, stressed EXERCISE and PRUDENT diet as the best therapy for this at this time.

## 2012-06-27 NOTE — Assessment & Plan Note (Signed)
He gets regular urologic follow up, including PSA screening/DRE/genital exams, so we deferred these things today.

## 2012-06-27 NOTE — Assessment & Plan Note (Signed)
I strongly encouraged him to do more than walking for exercise. Discussed need to start slowly and work his way up to a goal of 20-30 min of vigorous CV exercise 5 to 7 days a week. Discussed HR goal for him of 130s during exercise.

## 2012-06-27 NOTE — Assessment & Plan Note (Signed)
Not ideal control. Increase losartan to 100mg  daily and continue to monitor bp. Return in 1 mo to review.

## 2012-06-27 NOTE — Assessment & Plan Note (Signed)
Reviewed age and gender appropriate health maintenance issues (prudent diet, regular exercise, health risks of tobacco and excessive alcohol, use of seatbelts, fire alarms in home, use of sunscreen).  Also reviewed age and gender appropriate health screening as well as vaccine recommendations. Zostavax rx printed and given to patient to take to his pharmacy for dispensation and administration.

## 2012-06-27 NOTE — Assessment & Plan Note (Signed)
Not yet due for repeat TCS. GI will issue recall when appropriate.

## 2012-06-27 NOTE — Assessment & Plan Note (Signed)
No sign of RAD, but will give symbicort inhaler for him to try, 2 puffs bid x 2 wks (sample given, inhaler education done by CMA). Stop cough drops and OTC cough meds.  I also recommended that he try a 1 week course of daily allegra 180mg  to see if this helped with possible PND that may be contributing to this.  He said he didn't think that was what it was so I doubt he gets the allegra. No CXR or labs indicated at this time for this problem.   If cough unchanged in another 2 wks then he should return for recheck.  If new sx's such as SOB, wheezing, hemoptysis, fevers, etc begin then he should come in sooner.

## 2012-07-24 ENCOUNTER — Ambulatory Visit (INDEPENDENT_AMBULATORY_CARE_PROVIDER_SITE_OTHER): Payer: BC Managed Care – PPO | Admitting: Family Medicine

## 2012-07-24 ENCOUNTER — Encounter: Payer: Self-pay | Admitting: Family Medicine

## 2012-07-24 VITALS — BP 130/76 | HR 92 | Ht 69.0 in | Wt 244.0 lb

## 2012-07-24 DIAGNOSIS — H9312 Tinnitus, left ear: Secondary | ICD-10-CM

## 2012-07-24 MED ORDER — GABAPENTIN 100 MG PO CAPS: ORAL_CAPSULE | ORAL | Status: DC

## 2012-07-24 MED ORDER — METOPROLOL SUCCINATE ER 25 MG PO TB24: 25.0000 mg | ORAL_TABLET | Freq: Every day | ORAL | Status: DC

## 2012-07-24 NOTE — Progress Notes (Addendum)
OFFICE NOTE  07/24/2012  CC:  Chief Complaint  Patient presents with  . Follow-up    HTN     HPI: Patient is a 62 y.o. Caucasian male who is here for 1 mo f/u HTN. Feeling good, no complaints.  Recent resp illness I saw him for has resolved. Review of his home bp monitor's memory shows avg systolic about 135-140 and avg diastolic about 90. Compliant with med, no side effects felt.  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  He brings in a blurb from a magazine of some sort about neurontin being of possible help for tinnitis.  He asks if he can be prescribed this medication.  Pertinent PMH:  Past Medical History  Diagnosis Date  . Hyperlipidemia   . BPH (benign prostatic hypertrophy)     Urology following.  . 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  Annotation: previous methicillin sensitive  staphylococcus aureus 2006 Qualifier: Diagnosis of  By: Ninetta Lights MD, Tinnie Gens  (ID Specialist)    . History of adenomatous polyp of colon     Dr. Randa Evens: last colonoscopy was 2012 and was normal.   5 yr recall.  [name] HTN (hypertension)     MEDS:  Outpatient Prescriptions Prior to Visit  Medication Sig Dispense Refill  . aspirin 81 MG tablet Take 160 mg by mouth daily.      . cholecalciferol (VITAMIN D) 1000 UNITS tablet Take 1,000 Units by mouth daily.      [name] losartan (COZAAR) 100 MG tablet Take 1 tablet (100 mg total) by mouth daily.  30 tablet  1  . Tamsulosin HCl (FLOMAX) 0.4 MG CAPS TAKE ONE CAPSULE BY MOUTH EVERY DAY  90 capsule  2  . zoster vaccine live, PF, (ZOSTAVAX) 78295 UNT/0.65ML injection Inject 19,400 Units into the skin once.  1 vial  0   No facility-administered medications prior to visit.    PE: Blood pressure 130/76, pulse 92, height 5\' 9"  (1.753 m), weight 244 lb  (110.678 kg). Gen: Alert, well appearing.  Patient is oriented to person, place, time, and situation. CV: RRR, no m/r/g.   LUNGS: CTA bilat, nonlabored resps, good aeration in all lung fields.   IMPRESSION AND PLAN:  HTN (hypertension), benign Control much better but not ideal. I would like to add toprol XL 25mg  qd to help get bp and HR down a bit more. Therapeutic expectations and side effect profile of medication discussed today.  Patient's questions answered.   Tinnitus of left ear Pt asked about neurontin use for this and I agreed to give this a try--off label use discussed with pt.   An After Visit Summary was printed and given to the patient.  FOLLOW UP: 1 mo

## 2012-07-24 NOTE — Assessment & Plan Note (Signed)
Pt asked about neurontin use for this and I agreed to give this a try--off label use discussed with pt.

## 2012-07-24 NOTE — Assessment & Plan Note (Signed)
Control much better but not ideal. I would like to add toprol XL 25mg  qd to help get bp and HR down a bit more. Therapeutic expectations and side effect profile of medication discussed today.  Patient's questions answered.

## 2012-07-24 NOTE — Addendum Note (Signed)
Addended by: Jeoffrey Massed on: 07/24/2012 04:45 PM   Modules accepted: Orders

## 2012-08-26 ENCOUNTER — Encounter: Payer: Self-pay | Admitting: Family Medicine

## 2012-08-26 ENCOUNTER — Ambulatory Visit (INDEPENDENT_AMBULATORY_CARE_PROVIDER_SITE_OTHER): Payer: BC Managed Care – PPO | Admitting: Family Medicine

## 2012-08-26 VITALS — BP 114/76 | HR 64 | Ht 69.0 in | Wt 239.0 lb

## 2012-08-26 DIAGNOSIS — H9319 Tinnitus, unspecified ear: Secondary | ICD-10-CM

## 2012-08-26 DIAGNOSIS — I10 Essential (primary) hypertension: Secondary | ICD-10-CM

## 2012-08-26 DIAGNOSIS — H9312 Tinnitus, left ear: Secondary | ICD-10-CM

## 2012-08-26 NOTE — Progress Notes (Signed)
OFFICE NOTE  08/26/2012  CC:  Chief Complaint  Patient presents with  . Follow-up    HTN     HPI: Patient is a 62 y.o. Caucasian male who is here for 1 mo f/u HTN.   He feels well.  Home bps normal. Neurontin is working moderately well for his tinnitis--taking 300mg  tid now.  Pertinent PMH:  Past Medical History  Diagnosis Date  . Hyperlipidemia   . BPH (benign prostatic hypertrophy)     Urology following.  . 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  Annotation: previous methicillin sensitive  staphylococcus aureus 2006 Qualifier: Diagnosis of  By: Ninetta Lights MD, Tinnie Gens  (ID Specialist)    . History of adenomatous polyp of colon     Dr. Randa Evens: last colonoscopy was 2012 and was normal.   5 yr recall.  [name] HTN (hypertension)     MEDS:  Outpatient Prescriptions Prior to Visit  Medication Sig Dispense Refill  . aspirin 81 MG tablet Take 160 mg by mouth daily.      . cholecalciferol (VITAMIN D) 1000 UNITS tablet Take 1,000 Units by mouth daily.      [name] gabapentin (NEURONTIN) 100 MG capsule 1 cap po qhs x 7d, then 1 cap po bid x 7d, then 1 cap po tid  90 capsule  3  . losartan (COZAAR) 100 MG tablet Take 1 tablet (100 mg total) by mouth daily.  30 tablet  1  . metoprolol succinate (TOPROL-XL) 25 MG 24 hr tablet Take 1 tablet (25 mg total) by mouth daily.  30 tablet  1  . Tamsulosin HCl (FLOMAX) 0.4 MG CAPS TAKE ONE CAPSULE BY MOUTH EVERY DAY  90 capsule  2  . zoster vaccine live, PF, (ZOSTAVAX) 16109 UNT/0.65ML injection Inject 19,400 Units into the skin once.  1 vial  0   No facility-administered medications prior to visit.    PE: Blood pressure 114/76, pulse 64, height 5\' 9"  (1.753 m), weight 239 lb (108.41 kg). Gen: Alert, well appearing.  Patient is oriented to person, place, time, and situation. AFFECT: pleasant, lucid thought and speech. CV: RRR, no m/r/g.   LUNGS: CTA bilat, nonlabored resps, good aeration in all lung  fields. EXT: no clubbing, cyanosis, or edema.   IMPRESSION AND PLAN:  HTN (hypertension), benign Stable.  He is feeling well and happy with his regimen. Continue current meds. Check bp weekly at home. F/u 17mo in office.  Tinnitus of left ear Improving on neurontin (off label use, per pt request) 300mg  tid. Continue this dosing and if response plateau's in a few months we have room to increase dosing.   An After Visit Summary was printed and given to the patient.  FOLLOW UP: f/u 17mo

## 2012-08-26 NOTE — Assessment & Plan Note (Signed)
Improving on neurontin (off label use, per pt request) 300mg  tid. Continue this dosing and if response plateau's in a few months we have room to increase dosing.

## 2012-08-26 NOTE — Assessment & Plan Note (Signed)
Stable.  He is feeling well and happy with his regimen. Continue current meds. Check bp weekly at home. F/u 78mo in office.

## 2012-08-29 ENCOUNTER — Encounter: Payer: Self-pay | Admitting: Family Medicine

## 2012-09-02 ENCOUNTER — Telehealth: Payer: Self-pay | Admitting: *Deleted

## 2012-09-02 MED ORDER — LOSARTAN POTASSIUM 50 MG PO TABS: 50.0000 mg | ORAL_TABLET | Freq: Every day | ORAL | Status: DC

## 2012-09-02 NOTE — Telephone Encounter (Signed)
Resent rx because pharmacy said that they did not receive.

## 2012-09-03 ENCOUNTER — Telehealth: Payer: Self-pay | Admitting: *Deleted

## 2012-09-03 MED ORDER — LOSARTAN POTASSIUM 100 MG PO TABS: 100.0000 mg | ORAL_TABLET | Freq: Every day | ORAL | Status: DC

## 2012-09-03 NOTE — Telephone Encounter (Signed)
Fax from pharmacy requesting clarification on LOSARTAN dose.  Refill was sent on 09/02/12 for 50 mg dose.  Pt was taking 100mg  dose at last OV.  I spoke with Alecia Lemming at [facility].

## 2012-09-23 ENCOUNTER — Telehealth: Payer: Self-pay | Admitting: *Deleted

## 2012-09-23 MED ORDER — METOPROLOL SUCCINATE ER 25 MG PO TB24: 25.0000 mg | ORAL_TABLET | Freq: Every day | ORAL | Status: DC

## 2012-09-23 NOTE — Telephone Encounter (Signed)
Rx sent in to pharmacy. 

## 2012-10-01 ENCOUNTER — Ambulatory Visit (INDEPENDENT_AMBULATORY_CARE_PROVIDER_SITE_OTHER): Payer: BC Managed Care – PPO | Admitting: Family Medicine

## 2012-10-01 ENCOUNTER — Encounter: Payer: Self-pay | Admitting: Family Medicine

## 2012-10-01 VITALS — BP 123/87 | HR 76 | Temp 97.8°F | Resp 18 | Wt 237.8 lb

## 2012-10-01 DIAGNOSIS — J209 Acute bronchitis, unspecified: Secondary | ICD-10-CM

## 2012-10-01 DIAGNOSIS — J309 Allergic rhinitis, unspecified: Secondary | ICD-10-CM

## 2012-10-01 MED ORDER — ALBUTEROL SULFATE HFA 108 (90 BASE) MCG/ACT IN AERS: 2.0000 | INHALATION_SPRAY | RESPIRATORY_TRACT | Status: DC | PRN

## 2012-10-01 MED ORDER — FEXOFENADINE HCL 180 MG PO TABS: 180.0000 mg | ORAL_TABLET | Freq: Every day | ORAL | Status: DC

## 2012-10-01 MED ORDER — FLUTICASONE PROPIONATE 50 MCG/ACT NA SUSP: 2.0000 | Freq: Every day | NASAL | Status: DC

## 2012-10-01 MED ORDER — PREDNISONE 20 MG PO TABS: ORAL_TABLET | ORAL | Status: DC

## 2012-10-01 NOTE — Progress Notes (Signed)
OFFICE NOTE  10/01/2012  CC:  Chief Complaint  Patient presents with  . Cough    Pt c/o head & chest congestion, cough w/wheezing, phlegm "greenish" x10 days     HPI: Patient is a 62 y.o. Caucasian male who is here for respiratory complaints. Onset about 8-10d/a after mowing his yard.  Head congestion, runny nose, coughing up some phlegm, some wheezing at night. No fever.  Took claritin a couple days but no help + "cotton mouth". He is improving some over the last 3-4 days from an upper resp standpoint.  Pertinent PMH:  Past Medical History  Diagnosis Date  . Hyperlipidemia   . BPH (benign prostatic hypertrophy)     Urology following.  . 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  Annotation: previous methicillin sensitive  staphylococcus aureus 2006 Qualifier: Diagnosis of  By: Ninetta Lights MD, Tinnie Gens  (ID Specialist)    . History of adenomatous polyp of colon     Dr. Randa Evens: last colonoscopy was 2012 and was normal.   5 yr recall.  [name] HTN (hypertension)     MEDS:  Outpatient Prescriptions Prior to Visit  Medication Sig Dispense Refill  . aspirin 81 MG tablet Take 160 mg by mouth daily.      . cholecalciferol (VITAMIN D) 1000 UNITS tablet Take 1,000 Units by mouth daily.      [name] gabapentin (NEURONTIN) 100 MG capsule 1 cap po qhs x 7d, then 1 cap po bid x 7d, then 1 cap po tid  90 capsule  3  . losartan (COZAAR) 100 MG tablet Take 1 tablet (100 mg total) by mouth daily.  30 tablet  5  . metoprolol succinate (TOPROL-XL) 25 MG 24 hr tablet Take 1 tablet (25 mg total) by mouth daily.  30 tablet  1  . Tamsulosin HCl (FLOMAX) 0.4 MG CAPS TAKE ONE CAPSULE BY MOUTH EVERY DAY  90 capsule  2  . zoster vaccine live, PF, (ZOSTAVAX) 78295 UNT/0.65ML injection Inject 19,400 Units into the skin once.  1 vial  0   No facility-administered medications prior to visit.    PE: Weight 237 lb 12 oz (107.843 kg). Gen: Alert, well appearing.  Patient is  oriented to person, place, time, and situation. VS: noted--normal. Gen: alert, NAD, NONTOXIC APPEARING. HEENT: eyes without injection, drainage, or swelling.  Ears: EACs clear, TMs with normal light reflex and landmarks.  Nose: Clear rhinorrhea, with some dried, crusty exudate adherent to mildly injected mucosa.  No purulent d/c.  No paranasal sinus TTP.  No facial swelling.  Throat and mouth without focal lesion.  No pharyngial swelling, erythema, or exudate.   Neck: supple, no LAD.   LUNGS: CTA bilat, nonlabored resps.  With forced expiration he has coarse wheeze and lots of post-exhalation coughing. CV: RRR, no m/r/g. EXT: no c/c/e SKIN: no rash    IMPRESSION AND PLAN:  Allergic rhinitis/sinusitis + bronchitis/RAD. Discussed dx and plan: prednisone 40mg  qd x 5d, start flonase daily, allegra 180mg  qd, and I rx'd rescue albut inhaler. He has already had inhaler education in the past. Signs/symptoms to call or return for were reviewed and pt expressed understanding.   FOLLOW UP: prn

## 2012-11-18 ENCOUNTER — Other Ambulatory Visit: Payer: Self-pay | Admitting: Family Medicine

## 2012-11-18 MED ORDER — METOPROLOL SUCCINATE ER 25 MG PO TB24: 25.0000 mg | ORAL_TABLET | Freq: Every day | ORAL | Status: DC

## 2012-11-19 ENCOUNTER — Telehealth: Payer: Self-pay | Admitting: Family Medicine

## 2012-11-19 MED ORDER — METOPROLOL SUCCINATE ER 25 MG PO TB24: 25.0000 mg | ORAL_TABLET | Freq: Every day | ORAL | Status: DC

## 2012-11-19 MED ORDER — TAMSULOSIN HCL 0.4 MG PO CAPS: ORAL_CAPSULE | ORAL | Status: DC

## 2012-11-19 NOTE — Telephone Encounter (Signed)
Patient is leaving to go out of town tomorrow, will be gone for a week. Needs refill before he leaves.

## 2012-11-19 NOTE — Telephone Encounter (Signed)
Pt came into office to make sure we were going to give him refills.  I sent in 30 days with one refill per patient and had him make a follow up for August.

## 2012-11-20 ENCOUNTER — Other Ambulatory Visit: Payer: Self-pay | Admitting: *Deleted

## 2012-11-20 ENCOUNTER — Other Ambulatory Visit: Payer: Self-pay | Admitting: Family Medicine

## 2012-11-20 NOTE — Telephone Encounter (Signed)
Opened in error.  Med already filled yesterday.

## 2012-11-20 NOTE — Telephone Encounter (Signed)
error 

## 2013-01-01 ENCOUNTER — Other Ambulatory Visit: Payer: Self-pay | Admitting: Family Medicine

## 2013-01-01 ENCOUNTER — Encounter: Payer: Self-pay | Admitting: Family Medicine

## 2013-01-01 ENCOUNTER — Ambulatory Visit (INDEPENDENT_AMBULATORY_CARE_PROVIDER_SITE_OTHER): Payer: BC Managed Care – PPO | Admitting: Family Medicine

## 2013-01-01 VITALS — BP 128/88 | HR 93 | Temp 98.6°F | Resp 18 | Ht 69.0 in | Wt 241.0 lb

## 2013-01-01 DIAGNOSIS — I10 Essential (primary) hypertension: Secondary | ICD-10-CM

## 2013-01-01 DIAGNOSIS — D751 Secondary polycythemia: Secondary | ICD-10-CM

## 2013-01-01 DIAGNOSIS — D45 Polycythemia vera: Secondary | ICD-10-CM

## 2013-01-01 DIAGNOSIS — Z0000 Encounter for general adult medical examination without abnormal findings: Secondary | ICD-10-CM

## 2013-01-01 LAB — CBC WITH DIFFERENTIAL/PLATELET
Eosinophils Absolute: 0.2 10*3/uL (ref 0.0–0.7)
Eosinophils Relative: 2 % (ref 0–5)
Lymphs Abs: 2.5 10*3/uL (ref 0.7–4.0)
MCH: 30.1 pg (ref 26.0–34.0)
MCV: 84.7 fL (ref 78.0–100.0)
Monocytes Absolute: 1.1 10*3/uL — ABNORMAL HIGH (ref 0.1–1.0)
Platelets: 187 10*3/uL (ref 150–400)
RBC: 5.81 MIL/uL (ref 4.22–5.81)
RDW: 15.3 % (ref 11.5–15.5)

## 2013-01-01 LAB — BASIC METABOLIC PANEL
BUN: 23 mg/dL (ref 6–23)
Chloride: 104 mEq/L (ref 96–112)
Glucose, Bld: 96 mg/dL (ref 70–99)
Potassium: 4.7 mEq/L (ref 3.5–5.3)

## 2013-01-01 NOTE — Addendum Note (Signed)
Addended by: Baldemar Lenis R on: 01/01/2013 04:04 PM   Modules accepted: Orders

## 2013-01-01 NOTE — Progress Notes (Signed)
OFFICE NOTE  01/01/2013  CC:  Chief Complaint  Patient presents with  . Hypertension    follow up      HPI: Patient is a 62 y.o. Caucasian male who is here for 4 mo f/u HTN.   Bp checks at home <130/80s.   Feeling good.   Walking much more than when I saw him last.   Pertinent PMH:  Past Medical History  Diagnosis Date  . Hyperlipidemia   . BPH (benign prostatic hypertrophy)     Urology following.  . 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  Annotation: previous methicillin sensitive  staphylococcus aureus 2006 Qualifier: Diagnosis of  By: Ninetta Lights MD, Tinnie Gens  (ID Specialist)    . History of adenomatous polyp of colon     Dr. Randa Evens: last colonoscopy was 2012 and was normal.   5 yr recall.  [name] HTN (hypertension)    Past surgical, social, and family history reviewed and no changes noted since last office visit.  MEDS:  Outpatient Prescriptions Prior to Visit  Medication Sig Dispense Refill  . aspirin 81 MG tablet Take 160 mg by mouth daily.      . cholecalciferol (VITAMIN D) 1000 UNITS tablet Take 1,000 Units by mouth daily.      [name] losartan (COZAAR) 100 MG tablet Take 1 tablet (100 mg total) by mouth daily.  30 tablet  5  . metoprolol succinate (TOPROL-XL) 25 MG 24 hr tablet Take 1 tablet (25 mg total) by mouth daily.  30 tablet  1  . tamsulosin (FLOMAX) 0.4 MG CAPS TAKE ONE CAPSULE BY MOUTH EVERY DAY  30 capsule  1  . albuterol (PROAIR HFA) 108 (90 BASE) MCG/ACT inhaler Inhale 2 puffs into the lungs every 4 (four) hours as needed for wheezing.  1 Inhaler  0  . fexofenadine (ALLEGRA) 180 MG tablet Take 1 tablet (180 mg total) by mouth daily.  30 tablet  11  . fluticasone (FLONASE) 50 MCG/ACT nasal spray Place 2 sprays into the nose daily.  16 g  6  . gabapentin (NEURONTIN) 100 MG capsule 1 cap po qhs x 7d, then 1 cap po bid x 7d, then 1 cap po tid  90 capsule  3  . predniSONE (DELTASONE) 20 MG tablet 2 tabs po qd x 5d  10 tablet   0  . zoster vaccine live, PF, (ZOSTAVAX) 16109 UNT/0.65ML injection Inject 19,400 Units into the skin once.  1 vial  0   No facility-administered medications prior to visit.    PE: Blood pressure 128/88, pulse 93, temperature 98.6 F (37 C), temperature source Temporal, resp. rate 18, height 5\' 9"  (1.753 m), weight 241 lb (109.317 kg), SpO2 95.00%. Gen: Alert, well appearing.  Patient is oriented to person, place, time, and situation. CV: RRR, no m/r/g.   LUNGS: CTA bilat, nonlabored resps, good aeration in all lung fields. EXT: no clubbing, cyanosis, or edema.    IMPRESSION AND PLAN:  1) HTN, stable.  Continue current meds. Check lytes/cr today.  2) Hx of polycythemia Lab Results  Component Value Date   WBC 7.6 06/19/2012   HGB 17.7* 06/19/2012   HCT 52.8* 06/19/2012   MCV 89.1 06/19/2012   PLT 172.0 06/19/2012    Repeat CBC today.  Pt was reminded to get the zostavax vaccine at his earliest convenience.  FOLLOW UP: 6 mo for CPE with fasting labs the week prior.

## 2013-01-02 ENCOUNTER — Telehealth: Payer: Self-pay | Admitting: Family Medicine

## 2013-01-02 NOTE — Telephone Encounter (Signed)
Noted  

## 2013-01-02 NOTE — Telephone Encounter (Signed)
Patient notified that all labs from yesterday was normal

## 2013-01-10 ENCOUNTER — Encounter: Payer: Self-pay | Admitting: Family Medicine

## 2013-01-27 ENCOUNTER — Telehealth: Payer: Self-pay | Admitting: Family Medicine

## 2013-01-27 MED ORDER — LOSARTAN POTASSIUM 100 MG PO TABS: 100.0000 mg | ORAL_TABLET | Freq: Every day | ORAL | Status: DC

## 2013-01-27 NOTE — Telephone Encounter (Signed)
Pharmacy requesting refill on losartan for patient.  Did you d/c that medication?

## 2013-01-27 NOTE — Telephone Encounter (Signed)
No I didn't d/c it. I went ahead and eRx'd this med today.-thx

## 2013-03-27 ENCOUNTER — Other Ambulatory Visit: Payer: Self-pay | Admitting: Family Medicine

## 2013-03-27 MED ORDER — TAMSULOSIN HCL 0.4 MG PO CAPS: ORAL_CAPSULE | ORAL | Status: DC

## 2013-04-22 ENCOUNTER — Other Ambulatory Visit: Payer: Self-pay | Admitting: Family Medicine

## 2013-04-22 MED ORDER — METOPROLOL SUCCINATE ER 25 MG PO TB24: 25.0000 mg | ORAL_TABLET | Freq: Every day | ORAL | Status: DC

## 2013-04-27 ENCOUNTER — Encounter: Payer: Self-pay | Admitting: Family Medicine

## 2013-05-22 DIAGNOSIS — N200 Calculus of kidney: Secondary | ICD-10-CM

## 2013-05-22 HISTORY — DX: Calculus of kidney: N20.0

## 2013-06-16 ENCOUNTER — Other Ambulatory Visit: Payer: Self-pay | Admitting: Family Medicine

## 2013-06-16 DIAGNOSIS — Z0000 Encounter for general adult medical examination without abnormal findings: Secondary | ICD-10-CM

## 2013-06-16 DIAGNOSIS — D751 Secondary polycythemia: Secondary | ICD-10-CM

## 2013-06-16 DIAGNOSIS — I10 Essential (primary) hypertension: Secondary | ICD-10-CM

## 2013-06-16 DIAGNOSIS — E785 Hyperlipidemia, unspecified: Secondary | ICD-10-CM

## 2013-06-17 ENCOUNTER — Other Ambulatory Visit: Payer: BC Managed Care – PPO

## 2013-06-17 ENCOUNTER — Other Ambulatory Visit (INDEPENDENT_AMBULATORY_CARE_PROVIDER_SITE_OTHER): Payer: BC Managed Care – PPO

## 2013-06-17 DIAGNOSIS — I10 Essential (primary) hypertension: Secondary | ICD-10-CM

## 2013-06-17 DIAGNOSIS — Z0000 Encounter for general adult medical examination without abnormal findings: Secondary | ICD-10-CM

## 2013-06-17 DIAGNOSIS — E785 Hyperlipidemia, unspecified: Secondary | ICD-10-CM

## 2013-06-17 DIAGNOSIS — D45 Polycythemia vera: Secondary | ICD-10-CM

## 2013-06-17 DIAGNOSIS — D751 Secondary polycythemia: Secondary | ICD-10-CM

## 2013-06-17 LAB — COMPREHENSIVE METABOLIC PANEL
ALT: 23 U/L (ref 0–53)
AST: 19 U/L (ref 0–37)
Albumin: 3.9 g/dL (ref 3.5–5.2)
Alkaline Phosphatase: 66 U/L (ref 39–117)
BILIRUBIN TOTAL: 0.8 mg/dL (ref 0.3–1.2)
BUN: 15 mg/dL (ref 6–23)
CALCIUM: 9 mg/dL (ref 8.4–10.5)
CO2: 28 meq/L (ref 19–32)
CREATININE: 1.1 mg/dL (ref 0.4–1.5)
Chloride: 106 mEq/L (ref 96–112)
GFR: 69.29 mL/min (ref >60.00)
Glucose, Bld: 89 mg/dL (ref 70–99)
Potassium: 4.6 mEq/L (ref 3.5–5.1)
Sodium: 140 mEq/L (ref 135–145)
Total Protein: 7.2 g/dL (ref 6.0–8.3)

## 2013-06-17 LAB — CBC WITH DIFFERENTIAL/PLATELET
BASOS PCT: 0.5 % (ref 0.0–3.0)
Basophils Absolute: 0 10*3/uL (ref 0.0–0.1)
Eosinophils Absolute: 0.3 10*3/uL (ref 0.0–0.7)
Eosinophils Relative: 3.6 % (ref 0.0–5.0)
HCT: 52.5 % — ABNORMAL HIGH (ref 39.0–52.0)
HEMOGLOBIN: 17.7 g/dL — AB (ref 13.0–17.0)
LYMPHS PCT: 33.3 % (ref 12.0–46.0)
Lymphs Abs: 2.5 10*3/uL (ref 0.7–4.0)
MCHC: 33.8 g/dL (ref 30.0–36.0)
MCV: 88.4 fl (ref 78.0–100.0)
MONOS PCT: 12.2 % — AB (ref 3.0–12.0)
Monocytes Absolute: 0.9 10*3/uL (ref 0.1–1.0)
NEUTROS ABS: 3.8 10*3/uL (ref 1.4–7.7)
NEUTROS PCT: 50.4 % (ref 43.0–77.0)
Platelets: 182 10*3/uL (ref 150.0–400.0)
RBC: 5.94 Mil/uL — AB (ref 4.22–5.81)
RDW: 14.3 % (ref 11.5–14.6)
WBC: 7.5 10*3/uL (ref 4.5–10.5)

## 2013-06-17 LAB — LIPID PANEL
CHOL/HDL RATIO: 4
CHOLESTEROL: 161 mg/dL (ref 0–200)
HDL: 44.1 mg/dL (ref >39.00)
LDL CALC: 97 mg/dL (ref 0–99)
TRIGLYCERIDES: 100 mg/dL (ref 0.0–149.0)
VLDL: 20 mg/dL (ref 0.0–40.0)

## 2013-06-17 LAB — TSH: TSH: 1.64 u[IU]/mL (ref 0.35–5.50)

## 2013-06-25 ENCOUNTER — Encounter: Payer: Self-pay | Admitting: Family Medicine

## 2013-06-25 ENCOUNTER — Ambulatory Visit (INDEPENDENT_AMBULATORY_CARE_PROVIDER_SITE_OTHER): Payer: BC Managed Care – PPO | Admitting: Family Medicine

## 2013-06-25 VITALS — BP 127/84 | HR 95 | Temp 98.5°F | Ht 67.0 in | Wt 254.0 lb

## 2013-06-25 DIAGNOSIS — I10 Essential (primary) hypertension: Secondary | ICD-10-CM

## 2013-06-25 DIAGNOSIS — Z0000 Encounter for general adult medical examination without abnormal findings: Secondary | ICD-10-CM

## 2013-06-25 NOTE — Assessment & Plan Note (Signed)
Reviewed age and gender appropriate health maintenance issues (prudent diet, regular exercise, health risks of tobacco and excessive alcohol, use of seatbelts, fire alarms in home, use of sunscreen).  Also reviewed age and gender appropriate health screening as well as vaccine recommendations. EKG today normal. I reassured pt that there is no indication at this time for him to get CV stress test.  He expressed understanding. Reviewed recent fasting labs today: all wnl.

## 2013-06-25 NOTE — Progress Notes (Signed)
Office Note 06/25/2013  CC:  Chief Complaint  Patient presents with  . Annual Exam    HPI:  Derrick [name] is a 63 y.o. White male who is here for annual health maintenance exam. Feeling well.  Going to [place] soon. Gets PSA/DRE through his urologist, whom he sees for BPH.  Asks whether or not he should get a cardiac stress test.  He denies any chest pain or SOB with walking or exertion, although he says he rarely exerts himself. CV risk factors: distant tobacco hx, +HTN, obese. ( lipids fine, no DM)  Past Medical History  Diagnosis Date  . Hyperlipidemia   . BPH (benign prostatic hypertrophy)     Urology following (trial of rapaflo 04/2013; Dr. Junious Silk).  . 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  Annotation: previous methicillin sensitive  staphylococcus aureus 2006 Qualifier: Diagnosis of  By: Johnnye Sima MD, Dellis Filbert  (ID Specialist)    . History of adenomatous polyp of colon     Dr. Oletta Lamas: last colonoscopy was 2012 and was normal.   5 yr recall.  [name] HTN (hypertension)     Past Surgical History  Procedure Laterality Date  . Tonsillectomy    . Colonoscopy w/ polypectomy  2007 & 2012    Negative 2012; Dr Oletta Lamas  . Lumbar laminectomy      Dr Vertell Limber performed 2nd & 3rd procedures (pt also has hx of fall and vertebral fracture years prior to his surgeries.    Family History  Problem Relation Age of Onset  . Stroke Mother   . Cancer Mother 71    melanoma  . Cancer Father 84    lung cancer; [employer] (nonsmoker)  . Heart disease Maternal Grandmother   . Heart disease Maternal Grandfather   . Heart disease Paternal Grandmother   . Cancer Brother     bladder cancer    History   Social History  . Marital Status: Married    Spouse Name: N/A    Number of Children: N/A  . Years of Education: N/A   Occupational History  . Not on file.   Social History Main Topics  . Smoking status: Former Smoker    Quit  date: 05/22/1973  . Smokeless tobacco: Not on file     Comment: 35 years ago as of 2013  . Alcohol Use: No  . Drug Use: No  . Sexual Activity: Not on file   Other Topics Concern  . Not on file   Social History Narrative   Married, 1 son and 2 grandchildren.   Orig from [place] area, [employer].   Retired Electrical engineer (age 49).   [place] ([place]  Belongs to [employer].             Outpatient Prescriptions Prior to Visit  Medication Sig Dispense Refill  . aspirin 81 MG tablet Take 160 mg by mouth daily.      . fexofenadine (ALLEGRA) 180 MG tablet Take 1 tablet (180 mg total) by mouth daily.  30 tablet  11  . losartan (COZAAR) 100 MG tablet Take 1 tablet (100 mg total) by mouth daily.  30 tablet  6  . metoprolol succinate (TOPROL-XL) 25 MG 24 hr tablet Take 1 tablet (25 mg total) by mouth daily.  30 tablet  3  . tamsulosin (FLOMAX) 0.4 MG CAPS capsule TAKE ONE CAPSULE BY MOUTH EVERY DAY  30 capsule  3  . albuterol (PROAIR HFA) 108 (  90 BASE) MCG/ACT inhaler Inhale 2 puffs into the lungs every 4 (four) hours as needed for wheezing.  1 Inhaler  0  . cholecalciferol (VITAMIN D) 1000 UNITS tablet Take 1,000 Units by mouth daily.      . fluticasone (FLONASE) 50 MCG/ACT nasal spray Place 2 sprays into the nose daily.  16 g  6  . gabapentin (NEURONTIN) 100 MG capsule 1 cap po qhs x 7d, then 1 cap po bid x 7d, then 1 cap po tid  90 capsule  3  . predniSONE (DELTASONE) 20 MG tablet 2 tabs po qd x 5d  10 tablet  0  . zoster vaccine live, PF, (ZOSTAVAX) 36144 UNT/0.65ML injection Inject 19,400 Units into the skin once.  1 vial  0   No facility-administered medications prior to visit.  Not taking prednisone at this time  No Known Allergies  ROS Review of Systems  Constitutional: Negative for fever, chills, appetite change and fatigue.  HENT: Negative for congestion, dental problem, ear pain and sore throat.   Eyes: Negative for discharge, redness and visual disturbance.   Respiratory: Negative for cough, chest tightness, shortness of breath and wheezing.   Cardiovascular: Negative for chest pain, palpitations and leg swelling.  Gastrointestinal: Negative for nausea, vomiting, abdominal pain, diarrhea and blood in stool.  Genitourinary: Negative for dysuria, urgency, frequency, hematuria, flank pain and difficulty urinating.  Musculoskeletal: Negative for arthralgias, back pain, joint swelling, myalgias and neck stiffness.  Skin: Negative for pallor and rash.  Neurological: Negative for dizziness, speech difficulty, weakness and headaches.  Hematological: Negative for adenopathy. Does not bruise/bleed easily.  Psychiatric/Behavioral: Negative for confusion and sleep disturbance. The patient is not nervous/anxious.      PE; Blood pressure 127/84, pulse 95, temperature 98.5 F (36.9 C), temperature source Temporal, height 5\' 7"  (1.702 m), weight 254 lb (115.214 kg), SpO2 95.00%. Gen: Alert, well appearing, obese-appearing.  Patient is oriented to person, place, time, and situation. AFFECT: pleasant, lucid thought and speech. ENT: Ears: EACs clear, normal epithelium.  TMs with good light reflex and landmarks bilaterally.  Eyes: no injection, icteris, swelling, or exudate.  EOMI, PERRLA. Nose: no drainage or turbinate edema/swelling.  No injection or focal lesion.  Mouth: lips without lesion/swelling.  Oral mucosa pink and moist.  Dentition intact and without obvious caries or gingival swelling.  Oropharynx without erythema, exudate, or swelling.  Neck: supple/nontender.  No LAD, mass, or TM.  Carotid pulses 2+ bilaterally, without bruits. CV: RRR, no m/r/g.   LUNGS: CTA bilat, nonlabored resps, good aeration in all lung fields. ABD: soft, NT, ND, BS normal.  No hepatospenomegaly or mass.  No bruits. EXT: no clubbing, cyanosis, or edema.  Musculoskeletal: no joint swelling, erythema, warmth, or tenderness.  ROM of all joints intact. Skin - no sores or suspicious  lesions or rashes or color changes   Pertinent labs:  Lab Results  Component Value Date   TSH 1.64 06/17/2013   Lab Results  Component Value Date   WBC 7.5 06/17/2013   HGB 17.7* 06/17/2013   HCT 52.5* 06/17/2013   MCV 88.4 06/17/2013   PLT 182.0 06/17/2013   Lab Results  Component Value Date   CREATININE 1.1 06/17/2013   BUN 15 06/17/2013   NA 140 06/17/2013   K 4.6 06/17/2013   CL 106 06/17/2013   CO2 28 06/17/2013   Lab Results  Component Value Date   ALT 23 06/17/2013   AST 19 06/17/2013   ALKPHOS 66 06/17/2013  BILITOT 0.8 06/17/2013   Lab Results  Component Value Date   CHOL 161 06/17/2013   Lab Results  Component Value Date   HDL 44.10 06/17/2013   Lab Results  Component Value Date   LDLCALC 97 06/17/2013   Lab Results  Component Value Date   TRIG 100.0 06/17/2013   Lab Results  Component Value Date   CHOLHDL 4 06/17/2013   Lab Results  Component Value Date   PSA 1.95 06/21/2011   PSA 1.91 06/21/2010   PSA 1.53 06/25/2008   12 lead EKG today: NSR, no ischemic changes, no ectopy.  ASSESSMENT AND PLAN:   Health maintenance examination Reviewed age and gender appropriate health maintenance issues (prudent diet, regular exercise, health risks of tobacco and excessive alcohol, use of seatbelts, fire alarms in home, use of sunscreen).  Also reviewed age and gender appropriate health screening as well as vaccine recommendations. EKG today normal. I reassured pt that there is no indication at this time for him to get CV stress test.  He expressed understanding. Reviewed recent fasting labs today: all wnl.  Encouraged pt to get started with exercising more!  An After Visit Summary was printed and given to the patient.  FOLLOW UP:  Return in about 6 months (around 12/23/2013) for f/u HTN.

## 2013-06-25 NOTE — Progress Notes (Signed)
Pre-visit discussion using our clinic review tool. No additional management support is needed unless otherwise documented below in the visit note.  

## 2013-06-27 ENCOUNTER — Telehealth: Payer: Self-pay | Admitting: Family Medicine

## 2013-06-27 NOTE — Telephone Encounter (Signed)
Relevant patient education assigned to patient using Emmi. ° °

## 2013-07-25 ENCOUNTER — Other Ambulatory Visit: Payer: Self-pay | Admitting: Family Medicine

## 2013-07-25 MED ORDER — METOPROLOL SUCCINATE ER 25 MG PO TB24: 25.0000 mg | ORAL_TABLET | Freq: Every day | ORAL | Status: DC

## 2013-08-29 ENCOUNTER — Other Ambulatory Visit: Payer: Self-pay | Admitting: Family Medicine

## 2013-12-16 ENCOUNTER — Other Ambulatory Visit: Payer: Self-pay | Admitting: Family Medicine

## 2013-12-24 ENCOUNTER — Ambulatory Visit (INDEPENDENT_AMBULATORY_CARE_PROVIDER_SITE_OTHER): Payer: BC Managed Care – PPO | Admitting: Family Medicine

## 2013-12-24 ENCOUNTER — Encounter: Payer: Self-pay | Admitting: Family Medicine

## 2013-12-24 VITALS — BP 123/85 | HR 87 | Temp 98.7°F | Resp 18 | Ht 67.0 in | Wt 224.0 lb

## 2013-12-24 DIAGNOSIS — I10 Essential (primary) hypertension: Secondary | ICD-10-CM

## 2013-12-24 DIAGNOSIS — E669 Obesity, unspecified: Secondary | ICD-10-CM

## 2013-12-24 DIAGNOSIS — N400 Enlarged prostate without lower urinary tract symptoms: Secondary | ICD-10-CM

## 2013-12-24 MED ORDER — METOPROLOL SUCCINATE ER 25 MG PO TB24: ORAL_TABLET | ORAL | Status: DC

## 2013-12-24 MED ORDER — LOSARTAN POTASSIUM 50 MG PO TABS: 50.0000 mg | ORAL_TABLET | Freq: Every day | ORAL | Status: DC

## 2013-12-24 MED ORDER — FEXOFENADINE HCL 180 MG PO TABS: 180.0000 mg | ORAL_TABLET | Freq: Every day | ORAL | Status: DC

## 2013-12-24 MED ORDER — TAMSULOSIN HCL 0.4 MG PO CAPS: ORAL_CAPSULE | ORAL | Status: DC

## 2013-12-24 NOTE — Progress Notes (Signed)
OFFICE NOTE  12/24/2013  CC:  Chief Complaint  Patient presents with  . Follow-up   HPI: Patient is a 63 y.o. Caucasian male who is here for 6 mo f/u HTN.   Feeling good, has lost 30 lbs or so with great diet changes.  Not much exercise at all. Has been on rapaflo from urologist for BPH sx's but says he thinks flomax worked better so he wants to switch back. He goes to the beach almost weekly.   BP at home 120s over low 80s, HR usually 60s at home.  He asks about possibly weening off of some of his bp meds. Energy level is good: " best I've felt in years".  No CP, no dizziness, no SOB, no palpitations.  Pertinent PMH:  Past medical, surgical, social, and family history reviewed and no changes are noted since last office visit.  MEDS: Not taking flomax listed below Outpatient Prescriptions Prior to Visit  Medication Sig Dispense Refill  . aspirin 81 MG tablet Take 160 mg by mouth daily.      . fexofenadine (ALLEGRA) 180 MG tablet Take 1 tablet (180 mg total) by mouth daily.  30 tablet  11  . losartan (COZAAR) 100 MG tablet TAKE 1 TABLET BY MOUTH DAILY  30 tablet  5  . metoprolol succinate (TOPROL-XL) 25 MG 24 hr tablet TAKE 1 TABLET BY MOUTH DAILY  30 tablet  0  . tamsulosin (FLOMAX) 0.4 MG CAPS capsule TAKE ONE CAPSULE BY MOUTH EVERY DAY  30 capsule  3   No facility-administered medications prior to visit.    PE: Blood pressure 123/85, pulse 87, temperature 98.7 F (37.1 C), temperature source Temporal, resp. rate 18, height 5\' 7"  (1.702 m), weight 224 lb (101.606 kg), SpO2 95.00%. Gen: Alert, well appearing.  Patient is oriented to person, place, time, and situation. Neck: supple/nontender.  No LAD, mass, or TM.  Carotid pulses 2+ bilaterally, without bruits. CV: RRR, no m/r/g.   LUNGS: CTA bilat, nonlabored resps, good aeration in all lung fields. EXT: no clubbing, cyanosis, or edema.    IMPRESSION AND PLAN:  1) HTN: stable.  Lytes/cr good 05/2013--will repeat these again  in 42mo. Since pt has done so well with weight loss we decided to try to ween off of his losartan: change to 50mg  qd dosing and if home bp stays around 130/80 average then go ahead and d/c losartan altogether. Stay on toprol XL 25mg  qd and ASA 81mg  qd.  2) BPH: he wants to go back to using flomax 0.4mg  qhs so I rx'd this for him today and he'll stop the rapaflo that his urologist gave him.  3) Obesity.  BMI is down from 39 to 35 in the last 6 months!.  His goal is 20 more lbs of wt loss. Encouraged pt to keep up the great TLC changes.  An After Visit Summary was printed and given to the patient.  FOLLOW UP: 19mo for CPE with fasting labs (health panel) the week prior

## 2013-12-24 NOTE — Progress Notes (Signed)
Pre visit review using our clinic review tool, if applicable. No additional management support is needed unless otherwise documented below in the visit note. 

## 2013-12-30 ENCOUNTER — Encounter: Payer: Self-pay | Admitting: Family Medicine

## 2014-01-15 ENCOUNTER — Encounter: Payer: Self-pay | Admitting: Family Medicine

## 2014-03-10 ENCOUNTER — Encounter: Payer: Self-pay | Admitting: Family Medicine

## 2014-03-10 ENCOUNTER — Ambulatory Visit (INDEPENDENT_AMBULATORY_CARE_PROVIDER_SITE_OTHER): Payer: BC Managed Care – PPO | Admitting: Family Medicine

## 2014-03-10 VITALS — BP 125/84 | HR 86 | Temp 98.2°F | Resp 18 | Ht 67.0 in | Wt 222.0 lb

## 2014-03-10 DIAGNOSIS — M795 Residual foreign body in soft tissue: Secondary | ICD-10-CM

## 2014-03-10 DIAGNOSIS — N3000 Acute cystitis without hematuria: Secondary | ICD-10-CM

## 2014-03-10 MED ORDER — CIPROFLOXACIN HCL 500 MG PO TABS: 500.0000 mg | ORAL_TABLET | Freq: Two times a day (BID) | ORAL | Status: DC

## 2014-03-10 NOTE — Progress Notes (Signed)
Pre visit review using our clinic review tool, if applicable. No additional management support is needed unless otherwise documented below in the visit note. 

## 2014-03-10 NOTE — Progress Notes (Signed)
OFFICE NOTE  03/10/2014  CC:  Chief Complaint  Patient presents with  . Foot Problem    something in R foot   HPI: Patient is a 63 y.o. Caucasian male who is here for "something in right foot." About 3 mo ago he got a thorn in bottom of right foot while at the beach, feels painful still. Also, suffers from [place], urologist is working on med control--most recently with proscar. He has been having some UTI's in the last few months, says he felt sx's start again last night: frequency,urgency, dysuria.  Says he knows it is another infection and asks for RF of abx that urologist gave him most recently: we called urology today while he was in office and verified that his clx was + in August and he got cipro.  Pertinent PMH:  Past medical, surgical, social, and family history reviewed and no changes are noted since last office visit.  MEDS:  Outpatient Prescriptions Prior to Visit  Medication Sig Dispense Refill  . aspirin 81 MG tablet Take 160 mg by mouth daily.      . fexofenadine (ALLEGRA) 180 MG tablet Take 1 tablet (180 mg total) by mouth daily.  90 tablet  3  . finasteride (PROSCAR) 5 MG tablet Take 5 mg by mouth daily.      [name] losartan (COZAAR) 50 MG tablet Take 1 tablet (50 mg total) by mouth daily.  90 tablet  3  . metoprolol succinate (TOPROL-XL) 25 MG 24 hr tablet TAKE 1 TABLET BY MOUTH DAILY  90 tablet  3  . tamsulosin (FLOMAX) 0.4 MG CAPS capsule TAKE ONE CAPSULE BY MOUTH EVERY DAY  90 capsule  3   No facility-administered medications prior to visit.    PE: Blood pressure 125/84, pulse 86, temperature 98.2 F (36.8 C), temperature source Temporal, resp. rate 18, height 5\' 7"  (1.702 m), weight 222 lb (100.699 kg), SpO2 98.00%. Gen: Alert, well appearing.  Patient is oriented to person, place, time, and situation. Center of right foot plantar surface at the level of the metatarsal heads: there is a 2 mm focal callus with slight darkening c/w what looks like either a plantar wart  or a foreign body in the soft tissue beneath the dermis. No inflammation.  Mildly TTP directly over this spot.   IMPRESSION AND PLAN:  1) Foreign body with local reaction, plantar surface of right foot:  Shaved the top of a callus over this area today and this revealed a small pocket of thin, white fluid. I expressed this completely, searched for FB but found none.  Pt felt better when this was complete. He tolerated this well, no immediate complications, no bleeding. I cleaned the site and covered it with a band-aid. Signs/symptoms to call or return for were reviewed and pt expressed understanding.   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  An After Visit Summary was printed and given to the patient.  FOLLOW UP: prn

## 2014-04-07 ENCOUNTER — Encounter: Payer: Self-pay | Admitting: Family Medicine

## 2014-05-28 ENCOUNTER — Encounter: Payer: Self-pay | Admitting: Family Medicine

## 2014-06-13 ENCOUNTER — Encounter: Payer: Self-pay | Admitting: Family Medicine

## 2014-06-13 DIAGNOSIS — Z125 Encounter for screening for malignant neoplasm of prostate: Secondary | ICD-10-CM | POA: Insufficient documentation

## 2014-06-30 ENCOUNTER — Other Ambulatory Visit (INDEPENDENT_AMBULATORY_CARE_PROVIDER_SITE_OTHER): Payer: BLUE CROSS/BLUE SHIELD

## 2014-06-30 DIAGNOSIS — Z0000 Encounter for general adult medical examination without abnormal findings: Secondary | ICD-10-CM

## 2014-06-30 LAB — CBC WITH DIFFERENTIAL/PLATELET
BASOS ABS: 0 10*3/uL (ref 0.0–0.1)
Basophils Relative: 0.7 % (ref 0.0–3.0)
EOS ABS: 0.2 10*3/uL (ref 0.0–0.7)
Eosinophils Relative: 2.8 % (ref 0.0–5.0)
HEMATOCRIT: 52.8 % — AB (ref 39.0–52.0)
HEMOGLOBIN: 17.8 g/dL — AB (ref 13.0–17.0)
LYMPHS ABS: 1.8 10*3/uL (ref 0.7–4.0)
Lymphocytes Relative: 29.6 % (ref 12.0–46.0)
MCHC: 33.8 g/dL (ref 30.0–36.0)
MCV: 89.6 fl (ref 78.0–100.0)
Monocytes Absolute: 0.8 10*3/uL (ref 0.1–1.0)
Monocytes Relative: 13.2 % — ABNORMAL HIGH (ref 3.0–12.0)
NEUTROS ABS: 3.2 10*3/uL (ref 1.4–7.7)
Neutrophils Relative %: 53.7 % (ref 43.0–77.0)
PLATELETS: 160 10*3/uL (ref 150.0–400.0)
RBC: 5.89 Mil/uL — ABNORMAL HIGH (ref 4.22–5.81)
RDW: 15.5 % (ref 11.5–15.5)
WBC: 6 10*3/uL (ref 4.0–10.5)

## 2014-06-30 LAB — LIPID PANEL
CHOLESTEROL: 159 mg/dL (ref 0–200)
HDL: 51.9 mg/dL (ref >39.00)
LDL Cholesterol: 92 mg/dL (ref 0–99)
NONHDL: 107.1
Total CHOL/HDL Ratio: 3
Triglycerides: 74 mg/dL (ref 0.0–149.0)
VLDL: 14.8 mg/dL (ref 0.0–40.0)

## 2014-06-30 LAB — COMPREHENSIVE METABOLIC PANEL
ALBUMIN: 4.2 g/dL (ref 3.5–5.2)
ALT: 33 U/L (ref 0–53)
AST: 37 U/L (ref 0–37)
Alkaline Phosphatase: 67 U/L (ref 39–117)
BUN: 12 mg/dL (ref 6–23)
CALCIUM: 9.2 mg/dL (ref 8.4–10.5)
CHLORIDE: 102 meq/L (ref 96–112)
CO2: 29 meq/L (ref 19–32)
Creatinine, Ser: 0.94 mg/dL (ref 0.40–1.50)
GFR: 85.42 mL/min (ref >60.00)
Glucose, Bld: 98 mg/dL (ref 70–99)
POTASSIUM: 4.6 meq/L (ref 3.5–5.1)
Sodium: 138 mEq/L (ref 135–145)
TOTAL PROTEIN: 7 g/dL (ref 6.0–8.3)
Total Bilirubin: 1 mg/dL (ref 0.2–1.2)

## 2014-06-30 LAB — TSH: TSH: 1.53 u[IU]/mL (ref 0.35–4.50)

## 2014-07-01 ENCOUNTER — Other Ambulatory Visit: Payer: BC Managed Care – PPO

## 2014-07-07 ENCOUNTER — Ambulatory Visit (INDEPENDENT_AMBULATORY_CARE_PROVIDER_SITE_OTHER): Payer: BLUE CROSS/BLUE SHIELD | Admitting: Family Medicine

## 2014-07-07 ENCOUNTER — Encounter: Payer: Self-pay | Admitting: Family Medicine

## 2014-07-07 VITALS — BP 134/81 | HR 75 | Temp 97.6°F | Resp 18 | Ht 67.0 in | Wt 221.0 lb

## 2014-07-07 DIAGNOSIS — Z23 Encounter for immunization: Secondary | ICD-10-CM

## 2014-07-07 DIAGNOSIS — Z0000 Encounter for general adult medical examination without abnormal findings: Secondary | ICD-10-CM

## 2014-07-07 NOTE — Progress Notes (Signed)
Office Note 07/07/2014  CC:  Chief Complaint  Patient presents with  . Annual Exam    HPI:  Derrick [name] is a 64 y.o. White male who is here for annual CPE. Reviewed recent fasting health panel labs.  Exercising 3 times per week, cardio and light wt's, makes him feel good and he has lost some wt.  He has had PSA and DRE monitoring/screening for prostate ca via his urologist.  Says current urologist med regimen has him feeling as good as he has in years so far as these sx's go.   Past Medical History  Diagnosis Date  . Hyperlipidemia   . BPH with obstruction/lower urinary tract symptoms     +Hx of acute urinary retention/acute cystitis: responding fairly well to tamsulosin and finasteride as of 04/2014  . 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  Annotation: previous methicillin sensitive  staphylococcus aureus 2006 Qualifier: Diagnosis of  By: Johnnye Sima MD, Dellis Filbert  (ID Specialist)    . History of adenomatous polyp of colon     Dr. Oletta Lamas: last colonoscopy was 2012 and was normal.   5 yr recall.  [name] HTN (hypertension)   . Obesity, Class II, BMI 35-39.9   . Nephrolithiasis 2015    CT showed 2cm left sided stone which was stable since 2006 imaging and likely in renal parenchyma    Past Surgical History  Procedure Laterality Date  . Tonsillectomy    . Colonoscopy w/ polypectomy  2007 & 2012    Negative 2012; Dr Ollen Barges 2017  . Lumbar laminectomy      Dr Vertell Limber performed 2nd & 3rd procedures (pt also has hx of fall and vertebral fracture years prior to his surgeries.    Family History  Problem Relation Age of Onset  . Stroke Mother   . Cancer Mother 32    melanoma  . Cancer Father 51    lung cancer; [employer] 2 (nonsmoker)  . Heart disease Maternal Grandmother   . Heart disease Maternal Grandfather   . Heart disease Paternal Grandmother   . Cancer Brother     bladder cancer    History   Social History   . Marital Status: Married    Spouse Name: N/A  . Number of Children: N/A  . Years of Education: N/A   Occupational History  . Not on file.   Social History Main Topics  . Smoking status: Former Smoker    Quit date: 05/22/1973  . Smokeless tobacco: Not on file     Comment: 35 years ago as of 2013  . Alcohol Use: No  . Drug Use: No  . Sexual Activity: Not on file   Other Topics Concern  . Not on file   Social History Narrative   Married, 1 son and 2 grandchildren.   Orig from [place] area, [employer].   Retired Electrical engineer (age 9).   [place]  Belongs to [employer].            MEDS: not taking cipro listed below Outpatient Prescriptions Prior to Visit  Medication Sig Dispense Refill  . aspirin 81 MG tablet Take 160 mg by mouth daily.    . finasteride (PROSCAR) 5 MG tablet Take 5 mg by mouth daily.    [name] losartan (COZAAR) 50 MG tablet Take 1 tablet (50 mg total) by mouth daily. 90 tablet 3  . metoprolol succinate (TOPROL-XL) 25 MG 24 hr tablet TAKE 1 TABLET  BY MOUTH DAILY 90 tablet 3  . tamsulosin (FLOMAX) 0.4 MG CAPS capsule TAKE ONE CAPSULE BY MOUTH EVERY DAY 90 capsule 3  . ciprofloxacin (CIPRO) 500 MG tablet Take 1 tablet (500 mg total) by mouth 2 (two) times daily. 10 tablet 0  . fexofenadine (ALLEGRA) 180 MG tablet Take 1 tablet (180 mg total) by mouth daily. (Patient not taking: Reported on 07/07/2014) 90 tablet 3   No facility-administered medications prior to visit.    No Known Allergies  ROS Review of Systems  Constitutional: Negative for fever, chills, appetite change and fatigue.  HENT: Negative for congestion, dental problem, ear pain and sore throat.   Eyes: Negative for discharge, redness and visual disturbance.  Respiratory: Negative for cough, chest tightness, shortness of breath and wheezing.   Cardiovascular: Negative for chest pain, palpitations and leg swelling.  Gastrointestinal: Negative for nausea, vomiting, abdominal  pain, diarrhea and blood in stool.  Genitourinary: Negative for dysuria, urgency, frequency, hematuria, flank pain and difficulty urinating.  Musculoskeletal: Negative for myalgias, back pain, joint swelling, arthralgias and neck stiffness.  Skin: Negative for pallor and rash.  Neurological: Negative for dizziness, speech difficulty, weakness and headaches.  Hematological: Negative for adenopathy. Does not bruise/bleed easily.  Psychiatric/Behavioral: Negative for confusion and sleep disturbance. The patient is not nervous/anxious.     PE; Blood pressure 134/81, pulse 75, temperature 97.6 F (36.4 C), temperature source Temporal, resp. rate 18, height 5\' 7"  (1.702 m), weight 221 lb (100.245 kg), SpO2 97 %. Gen: Alert, well appearing.  Patient is oriented to person, place, time, and situation. AFFECT: pleasant, lucid thought and speech. ENT: Ears: EACs clear, normal epithelium.  TMs with good light reflex and landmarks bilaterally.  Eyes: no injection, icteris, swelling, or exudate.  EOMI, PERRLA. Nose: no drainage or turbinate edema/swelling.  No injection or focal lesion.  Mouth: lips without lesion/swelling.  Oral mucosa pink and moist.  Dentition intact and without obvious caries or gingival swelling.  Oropharynx without erythema, exudate, or swelling.  Neck: supple/nontender.  No LAD, mass, or TM.  Carotid pulses 2+ bilaterally, without bruits. CV: RRR, no m/r/g.   LUNGS: CTA bilat, nonlabored resps, good aeration in all lung fields. ABD: soft, NT, ND, BS normal.  No hepatospenomegaly or mass.  No bruits. EXT: no clubbing, cyanosis, or edema.  Musculoskeletal: no joint swelling, erythema, warmth, or tenderness.  ROM of all joints intact. Skin - no sores or suspicious lesions or rashes or color changes Rectal: deferred  Pertinent labs:  Lab Results  Component Value Date   TSH 1.53 06/30/2014   Lab Results  Component Value Date   WBC 6.0 06/30/2014   HGB 17.8* 06/30/2014   HCT  52.8* 06/30/2014   MCV 89.6 06/30/2014   PLT 160.0 06/30/2014   Lab Results  Component Value Date   CREATININE 0.94 06/30/2014   BUN 12 06/30/2014   NA 138 06/30/2014   K 4.6 06/30/2014   CL 102 06/30/2014   CO2 29 06/30/2014   Lab Results  Component Value Date   ALT 33 06/30/2014   AST 37 06/30/2014   ALKPHOS 67 06/30/2014   BILITOT 1.0 06/30/2014   Lab Results  Component Value Date   CHOL 159 06/30/2014   Lab Results  Component Value Date   HDL 51.90 06/30/2014   Lab Results  Component Value Date   LDLCALC 92 06/30/2014   Lab Results  Component Value Date   TRIG 74.0 06/30/2014   Lab Results  Component Value  Date   CHOLHDL 3 06/30/2014   Lab Results  Component Value Date   PSA 1.95 06/21/2011   PSA 1.91 06/21/2010   PSA 1.53 06/25/2008    ASSESSMENT AND PLAN:   Health maintenance examination Reviewed age and gender appropriate health maintenance issues (prudent diet, regular exercise, health risks of tobacco and excessive alcohol, use of seatbelts, fire alarms in home, use of sunscreen).  Also reviewed age and gender appropriate health screening as well as vaccine recommendations. Pt's labs were all good except for chronic mild elevation of Hb, for which I questioned him about OSA symptoms and he said he does not snore and actually came right out and said "I don't have sleep apnea". He declines further w/u for this potential problem.  Since Hb has been stable, I think it is fine to leave alone and monitor Hb annually. Tdap and prevnar 13 given IM today.  He is otherwise fully UTD on vaccines. Prostate ca screening UTD: gets this done through urologist now. Colon ca screening: he is due for repeat colonoscopy in 2017.   An After Visit Summary was printed and given to the patient.  FOLLOW UP:  Return in about 6 months (around 01/05/2015) for routine chronic illness f/u.

## 2014-07-07 NOTE — Assessment & Plan Note (Signed)
Reviewed age and gender appropriate health maintenance issues (prudent diet, regular exercise, health risks of tobacco and excessive alcohol, use of seatbelts, fire alarms in home, use of sunscreen).  Also reviewed age and gender appropriate health screening as well as vaccine recommendations. Pt's labs were all good except for chronic mild elevation of Hb, for which I questioned him about OSA symptoms and he said he does not snore and actually came right out and said "I don't have sleep apnea". He declines further w/u for this potential problem.  Since Hb has been stable, I think it is fine to leave alone and monitor Hb annually. Tdap and prevnar 13 given IM today.  He is otherwise fully UTD on vaccines. Prostate ca screening UTD: gets this done through urologist now. Colon ca screening: he is due for repeat colonoscopy in 2017.

## 2014-07-07 NOTE — Progress Notes (Signed)
Pre visit review using our clinic review tool, if applicable. No additional management support is needed unless otherwise documented below in the visit note. 

## 2014-07-08 ENCOUNTER — Encounter: Payer: BC Managed Care – PPO | Admitting: Family Medicine

## 2014-10-23 ENCOUNTER — Other Ambulatory Visit: Payer: Self-pay | Admitting: *Deleted

## 2014-10-23 MED ORDER — LOSARTAN POTASSIUM 50 MG PO TABS: 50.0000 mg | ORAL_TABLET | Freq: Every day | ORAL | Status: DC

## 2014-10-23 NOTE — Telephone Encounter (Signed)
Fax from [facility]) requesting refill for Losartan 50mg  take 1 table daily. LOV 07/07/14, no up coming ov, last written: 12/25/14 #90 w/ 3 RF. Rx sent for #90 w/ 1RF.

## 2014-12-25 ENCOUNTER — Other Ambulatory Visit: Payer: Self-pay | Admitting: *Deleted

## 2014-12-25 MED ORDER — TAMSULOSIN HCL 0.4 MG PO CAPS: ORAL_CAPSULE | ORAL | Status: DC

## 2014-12-25 NOTE — Telephone Encounter (Signed)
RF request for tamsulosin LOV: 07/07/14 Next ov: None Last written: 12/24/13 #90 w/3RF

## 2014-12-28 ENCOUNTER — Other Ambulatory Visit: Payer: Self-pay | Admitting: *Deleted

## 2014-12-28 MED ORDER — LOSARTAN POTASSIUM 50 MG PO TABS: 50.0000 mg | ORAL_TABLET | Freq: Every day | ORAL | Status: DC

## 2014-12-28 NOTE — Telephone Encounter (Signed)
Apt made for 01/06/15 at 9:15am.

## 2014-12-28 NOTE — Telephone Encounter (Signed)
RF request for losartan LOV: 07/07/14 Next ov: None Last written: 10/23/14 #90 w/ 1RF

## 2014-12-31 ENCOUNTER — Other Ambulatory Visit: Payer: Self-pay | Admitting: *Deleted

## 2014-12-31 MED ORDER — METOPROLOL SUCCINATE ER 25 MG PO TB24: ORAL_TABLET | ORAL | Status: DC

## 2014-12-31 NOTE — Telephone Encounter (Signed)
RF request for metoprolol LOV: 07/07/14 Next ov:  01/06/15 Last written: 12/24/13 #90 w/ 3RF

## 2015-01-06 ENCOUNTER — Ambulatory Visit (INDEPENDENT_AMBULATORY_CARE_PROVIDER_SITE_OTHER): Payer: BLUE CROSS/BLUE SHIELD | Admitting: Family Medicine

## 2015-01-06 ENCOUNTER — Encounter: Payer: Self-pay | Admitting: Family Medicine

## 2015-01-06 VITALS — BP 128/80 | HR 63 | Temp 98.1°F | Resp 16 | Ht 67.0 in | Wt 240.0 lb

## 2015-01-06 DIAGNOSIS — I10 Essential (primary) hypertension: Secondary | ICD-10-CM | POA: Diagnosis not present

## 2015-01-06 DIAGNOSIS — E669 Obesity, unspecified: Secondary | ICD-10-CM

## 2015-01-06 NOTE — Progress Notes (Signed)
OFFICE NOTE  01/06/2015  CC:  Chief Complaint  Patient presents with  . Follow-up    Pt is not fasting.   HPI: Patient is a 64 y.o. Caucasian male who is here for 6 mo f/u HTN. BP's at home are good. He admits to poor diet lately, going to beach a lot and splurging.  He says he plans on getting back into better diet soon. Compliant with meds, religiously.   Pertinent PMH:  Past medical, surgical, social, and family history reviewed and no changes are noted since last office visit.  MEDS:  Outpatient Prescriptions Prior to Visit  Medication Sig Dispense Refill  . aspirin 81 MG tablet Take 160 mg by mouth daily.    [name] losartan (COZAAR) 50 MG tablet Take 1 tablet (50 mg total) by mouth daily. 90 tablet 0  . metoprolol succinate (TOPROL-XL) 25 MG 24 hr tablet TAKE 1 TABLET BY MOUTH DAILY 90 tablet 1  . tamsulosin (FLOMAX) 0.4 MG CAPS capsule TAKE ONE CAPSULE BY MOUTH EVERY DAY 90 capsule 0  . finasteride (PROSCAR) 5 MG tablet Take 5 mg by mouth daily.     No facility-administered medications prior to visit.    PE: Blood pressure 128/80, pulse 63, temperature 98.1 F (36.7 C), temperature source Oral, resp. rate 16, height 5\' 7"  (1.702 m), weight 240 lb (108.863 kg), SpO2 97 %. Gen: Alert, well appearing.  Patient is oriented to person, place, time, and situation. CV: RRR, no m/r/g.   LUNGS: CTA bilat, nonlabored resps, good aeration in all lung fields. EXT: no clubbing, cyanosis, or edema.    LABS: Lab Results  Component Value Date   TSH 1.53 06/30/2014   Lab Results  Component Value Date   WBC 6.0 06/30/2014   HGB 17.8* 06/30/2014   HCT 52.8* 06/30/2014   MCV 89.6 06/30/2014   PLT 160.0 06/30/2014   Lab Results  Component Value Date   CREATININE 0.94 06/30/2014   BUN 12 06/30/2014   NA 138 06/30/2014   K 4.6 06/30/2014   CL 102 06/30/2014   CO2 29 06/30/2014   Lab Results  Component Value Date   ALT 33 06/30/2014   AST 37 06/30/2014   ALKPHOS 67  06/30/2014   BILITOT 1.0 06/30/2014   Lab Results  Component Value Date   CHOL 159 06/30/2014   Lab Results  Component Value Date   HDL 51.90 06/30/2014   Lab Results  Component Value Date   LDLCALC 92 06/30/2014   Lab Results  Component Value Date   TRIG 74.0 06/30/2014   Lab Results  Component Value Date   CHOLHDL 3 06/30/2014    IMPRESSION AND PLAN:  1) HTN; The current medical regimen is effective;  continue present plan and medications. No labs today.  2) Obesity: needs to get serious again about TLC, which he says he is going to do.  An After Visit Summary was printed and given to the patient.   FOLLOW UP: 6 mo for CPE

## 2015-01-06 NOTE — Progress Notes (Signed)
Pre visit review using our clinic review tool, if applicable. No additional management support is needed unless otherwise documented below in the visit note. 

## 2015-03-29 ENCOUNTER — Other Ambulatory Visit: Payer: Self-pay | Admitting: *Deleted

## 2015-03-29 MED ORDER — LOSARTAN POTASSIUM 50 MG PO TABS: 50.0000 mg | ORAL_TABLET | Freq: Every day | ORAL | Status: DC

## 2015-03-29 NOTE — Telephone Encounter (Signed)
RF request for losartan LOV: 01/06/15 Next ov: 07/12/15 Last written: 12/28/14 #90 w/ 0RF

## 2015-04-02 ENCOUNTER — Other Ambulatory Visit: Payer: Self-pay | Admitting: *Deleted

## 2015-04-02 MED ORDER — TAMSULOSIN HCL 0.4 MG PO CAPS: ORAL_CAPSULE | ORAL | Status: DC

## 2015-04-02 NOTE — Telephone Encounter (Signed)
RF request for tamsulosin LOV: 01/06/15 Next ov: 07/12/15 Last written: 12/25/14 #90 w/ 0RF

## 2015-05-14 ENCOUNTER — Ambulatory Visit (INDEPENDENT_AMBULATORY_CARE_PROVIDER_SITE_OTHER): Payer: BLUE CROSS/BLUE SHIELD | Admitting: Family Medicine

## 2015-05-14 ENCOUNTER — Ambulatory Visit (HOSPITAL_BASED_OUTPATIENT_CLINIC_OR_DEPARTMENT_OTHER)
Admission: RE | Admit: 2015-05-14 | Discharge: 2015-05-14 | Disposition: A | Discharge status: Home / Self Care | Payer: BLUE CROSS/BLUE SHIELD | Source: Ambulatory Visit | Attending: Family Medicine | Admitting: Family Medicine

## 2015-05-14 ENCOUNTER — Encounter: Payer: Self-pay | Admitting: Family Medicine

## 2015-05-14 VITALS — BP 126/77 | HR 84 | Temp 98.1°F | Resp 20 | Wt 246.2 lb

## 2015-05-14 DIAGNOSIS — S8261XA Displaced fracture of lateral malleolus of right fibula, initial encounter for closed fracture: Secondary | ICD-10-CM | POA: Insufficient documentation

## 2015-05-14 DIAGNOSIS — S93401A Sprain of unspecified ligament of right ankle, initial encounter: Secondary | ICD-10-CM | POA: Diagnosis present

## 2015-05-14 DIAGNOSIS — S82891A Other fracture of right lower leg, initial encounter for closed fracture: Secondary | ICD-10-CM

## 2015-05-14 DIAGNOSIS — X501XXA Overexertion from prolonged static or awkward postures, initial encounter: Secondary | ICD-10-CM | POA: Insufficient documentation

## 2015-05-14 NOTE — Addendum Note (Signed)
Addended by: Leota Jacobsen on: 05/14/2015 10:46 AM   Modules accepted: Orders

## 2015-05-14 NOTE — Progress Notes (Signed)
OFFICE NOTE  05/14/2015  CC:  Chief Complaint  Patient presents with  . Ankle Injury    2 weeks ago   HPI: Patient is a 64 y.o. Caucasian male who is here for right ankle pain. About 2 1/2 weeks ago he rolled his right ankle in his driveway.  It swelled up, esp in lateral ankle, and has remained swollen.  He has wrapped it with an ACE wrap.  No anti-inflamm taken.   Pertinent PMH:  Past medical, surgical, social, and family history reviewed and no changes are noted since last office visit.  MEDS:  Outpatient Prescriptions Prior to Visit  Medication Sig Dispense Refill  . aspirin 81 MG tablet Take 160 mg by mouth daily.    . finasteride (PROSCAR) 5 MG tablet Take 5 mg by mouth daily.    [name] losartan (COZAAR) 50 MG tablet Take 1 tablet (50 mg total) by mouth daily. 90 tablet 3  . metoprolol succinate (TOPROL-XL) 25 MG 24 hr tablet TAKE 1 TABLET BY MOUTH DAILY 90 tablet 1  . tamsulosin (FLOMAX) 0.4 MG CAPS capsule TAKE ONE CAPSULE BY MOUTH EVERY DAY 90 capsule 1   No facility-administered medications prior to visit.    PE: Blood pressure 126/77, pulse 84, temperature 98.1 F (36.7 C), resp. rate 20, weight 246 lb 4 oz (111.698 kg), SpO2 95 %. Gen: Alert, well appearing.  Patient is oriented to person, place, time, and situation. Lower legs: 1+ pitting edema on R, trace on L Right ankle with warmth, generalized swelling, and tenderness mostly in lateral aspect around and ON the lateral malleolus.  R ankle ROM fully intact w/out pain.  Talar tilt is fine, anterior drawer is fine. N/V intact.  IMPRESSION AND PLAN:  R ankle sprain, with significant focal lateral malleolus tenderness. Still with moderate generalized ankle swelling, limping. Will check x-ray today, put him in ankle lace-up support.  Light ROM exercises discussed, icing and elevation discussed. Recommended ibuprofen 600 mg bid with food x 10d.  An After Visit Summary was printed and given to the patient.  FOLLOW  UP: call or return if not significantly improved in 2 wks.

## 2015-05-14 NOTE — Patient Instructions (Signed)
Take ibuprofen 600 mg with food twice a day for 10 days.

## 2015-05-18 ENCOUNTER — Ambulatory Visit (INDEPENDENT_AMBULATORY_CARE_PROVIDER_SITE_OTHER): Payer: BLUE CROSS/BLUE SHIELD | Admitting: Family Medicine

## 2015-05-18 ENCOUNTER — Other Ambulatory Visit (INDEPENDENT_AMBULATORY_CARE_PROVIDER_SITE_OTHER): Payer: BLUE CROSS/BLUE SHIELD

## 2015-05-18 ENCOUNTER — Encounter: Payer: Self-pay | Admitting: Family Medicine

## 2015-05-18 VITALS — BP 140/88 | HR 96 | Ht 68.0 in | Wt 245.0 lb

## 2015-05-18 DIAGNOSIS — D1621 Benign neoplasm of long bones of right lower limb: Secondary | ICD-10-CM | POA: Diagnosis not present

## 2015-05-18 DIAGNOSIS — M25571 Pain in right ankle and joints of right foot: Secondary | ICD-10-CM

## 2015-05-18 DIAGNOSIS — S8261XA Displaced fracture of lateral malleolus of right fibula, initial encounter for closed fracture: Secondary | ICD-10-CM | POA: Diagnosis not present

## 2015-05-18 DIAGNOSIS — S8263XA Displaced fracture of lateral malleolus of unspecified fibula, initial encounter for closed fracture: Secondary | ICD-10-CM | POA: Insufficient documentation

## 2015-05-18 DIAGNOSIS — M19079 Primary osteoarthritis, unspecified ankle and foot: Secondary | ICD-10-CM

## 2015-05-18 DIAGNOSIS — M129 Arthropathy, unspecified: Secondary | ICD-10-CM

## 2015-05-18 NOTE — Assessment & Plan Note (Signed)
Doing well.  Splint given, air splint. Home exercises given at work with Product/process development scientist. Patient has topical anti-inflammatory and pain medications if needed. Patient declined a Dispensing optician. Discussed with patient and will likely take 3-6 weeks but prognosis seems to be relatively good. Patient come back in 3 weeks we will get a repeat x-ray as well as ultrasound to make sure good callus formation.

## 2015-05-18 NOTE — Progress Notes (Signed)
Pre visit review using our clinic review tool, if applicable. No additional management support is needed unless otherwise documented below in the visit note. 

## 2015-05-18 NOTE — Patient Instructions (Signed)
Good to see you Derrick [name] 20 minutes 2 times daily. Usually after activity and before bed. Exercises 3 times a week.  Wear brace daily for next 10 days then only with t a lot of activity Do not sleep in the brace Vitamin D 2000 IU daily to help healing See me again in 3 weeks and come in early and we will make sure you are healing appropriately  Happy New Year!

## 2015-05-18 NOTE — Progress Notes (Signed)
[name] [address] [place], [place] [address] Phone: [phone] Subjective:    I'm seeing this patient by the request  of:  MCGOWEN,PHILIP H, MD   CC: Right ankle pain after injury 3 weeks ago  [id number] Derrick [name] is a 64 y.o. male coming in with complaint of right ankle pain and swelling. Patient 3 weeks ago did roll his ankle. Was nonweightbearing for approximately 1 week and then has started increasing his activity. States that he can walk fairly regularly at this time but continues to have discomfort on the lateral aspect the ankle. Patient did see primary care provider and x-rays were ordered.  X-rays were visually reviewed by me today. X-rays the patient's right ankle shows the patient does have a lateral malleus fracture readily displace. Chronic generative changes of the ankle it seems to be mild and osteochondroma of the distal fibula. Mild callus formation on the plantar heel as well.  Patient rates the severity of pain is more 3 out of 10. States that after sitting a long amount of time or in the morning some stiffness. No instability. No numbness or weakness. Does respond to over-the-counter anti-inflammatories.     Past Medical History  Diagnosis Date  . Hyperlipidemia   . BPH with obstruction/lower urinary tract symptoms     +Hx of acute urinary retention/acute cystitis: responding fairly well to tamsulosin and finasteride as of 04/2014  . 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  Annotation: previous methicillin sensitive  staphylococcus aureus 2006 Qualifier: Diagnosis of  By: Johnnye Sima MD, Dellis Filbert  (ID Specialist)    . History of adenomatous polyp of colon     Dr. Oletta Lamas: last colonoscopy was 2012 and was normal.   5 yr recall.  [name] HTN (hypertension)   . Obesity, Class II, BMI 35-39.9   . Nephrolithiasis 2015    CT showed 2cm left sided stone which was stable since 2006 imaging  and likely in renal parenchyma   Past Surgical History  Procedure Laterality Date  . Tonsillectomy    . Colonoscopy w/ polypectomy  2007 & 2012    Negative 2012; Dr Ollen Barges 2017  . Lumbar laminectomy      Dr Vertell Limber performed 2nd & 3rd procedures (pt also has hx of fall and vertebral fracture years prior to his surgeries.   Social History  Substance Use Topics  . Smoking status: Former Smoker    Quit date: 05/22/1973  . Smokeless tobacco: None     Comment: 35 years ago as of 2013  . Alcohol Use: No   No Known Allergies Family History  Problem Relation Age of Onset  . Stroke Mother   . Cancer Mother 57    melanoma  . Cancer Father 83    lung cancer; [employer] 2 (nonsmoker)  . Heart disease Maternal Grandmother   . Heart disease Maternal Grandfather   . Heart disease Paternal Grandmother   . Cancer Brother     bladder cancer     Past medical history, social, surgical and family history all reviewed in electronic medical record.   Review of Systems: No headache, visual changes, nausea, vomiting, diarrhea, constipation, dizziness, abdominal pain, skin rash, fevers, chills, night sweats, weight loss, swollen lymph nodes, body aches, joint swelling, muscle aches, chest pain, shortness of breath, mood changes.   Objective Blood pressure 140/88, pulse 96, height 5\' 8"  (1.727 m), weight 245 lb (111.131 kg), SpO2  96 %.  General: No apparent distress alert and oriented x3 mood and affect normal, dressed appropriately.  HEENT: Pupils equal, extraocular movements intact  Respiratory: Patient's speak in full sentences and does not appear short of breath  Cardiovascular: No lower extremity edema, non tender, no erythema  Skin: Warm dry intact with no signs of infection or rash on extremities or on axial skeleton.  Abdomen: Soft nontender  Neuro: Cranial nerves II through XII are intact, neurovascularly intact in all extremities with 2+ DTRs and 2+ pulses.  Lymph: No  lymphadenopathy of posterior or anterior cervical chain or axillae bilaterally.  Gait mild antalgic  MSK:  Non tender with full range of motion and good stability and symmetric strength and tone of shoulders, elbows, wrist, hip, knee and bilaterally.  Ankle: Right Mild swelling over the lateral aspect Range of motion is full in all directions. Strength is 5/5 in all directions. Stable lateral and medial ligaments; squeeze test and kleiger test unremarkable; Talar dome nontender; No pain at base of 5th MT; No tenderness over cuboid; No tenderness over N spot or navicular prominence Tender to palpation on the distal aspect of the lateral malleolus. No crepitus noted. Negative tarsal tunnel tinel's Able to walk 4 steps. Contralateral ankle unremarkable  MSK US performed of: Right ankle This study was ordered, performed, and interpreted by Charlann Boxer D.O.  Foot/Ankle:   All structures visualized.   Talar dome unremarkable  Ankle mortise without effusion. Peroneus longus and brevis tendons unremarkable on long and transverse views without sheath effusions. Posterior tibialis, flexor hallucis longus, and flexor digitorum longus tendons unremarkable on long and transverse views without sheath effusions. Achilles tendon visualized along length of tendon and unremarkable on long and transverse views without sheath effusion. Anterior Talofibular Ligament chronic changes but intact  Lateral malleolus show fracture that is minimally displaced. Hypoechoic changes but increasing Doppler flow noted. Mild callus formation starting to form but seems to be soft.  IMPRESSION:  Lateral malleolus fracture with minimal displacement  Procedure note 97110; 15 minutes spent for Therapeutic exercises as stated in above notes.  This included exercises focusing on stretching, strengthening, with significant focus on eccentric aspects. Basic range of motion exercises to allow proper full motion at  ankle Stretching of the lower leg and hamstrings  Theraband exercises for the lower leg - inversion, eversion, dorsiflexion and plantarflexion each to be completed with a theraband Balance exercises to increase proprioception Weight bearing exercises to increase strength and balance  Proper technique shown and discussed handout in great detail with ATC.  All questions were discussed and answered.     Impression and Recommendations:     This case required medical decision making of moderate complexity.

## 2015-05-23 DIAGNOSIS — Z8781 Personal history of (healed) traumatic fracture: Secondary | ICD-10-CM

## 2015-05-23 HISTORY — DX: Personal history of (healed) traumatic fracture: Z87.81

## 2015-06-02 ENCOUNTER — Telehealth: Payer: Self-pay | Admitting: Family Medicine

## 2015-06-02 NOTE — Telephone Encounter (Signed)
Please advise. Thanks.  

## 2015-06-02 NOTE — Telephone Encounter (Signed)
Get otc generic robitussin DM OR Mucinex DM and use as directed on the packaging for cough and congestion. Use otc generic saline nasal spray 2-3 times per day to irrigate/moisturize your nasal passages.   

## 2015-06-02 NOTE — Telephone Encounter (Signed)
Patient has a lot of congestion. Is there anything that can be called in for him?

## 2015-06-02 NOTE — Telephone Encounter (Signed)
Pt advised and voiced understanding.   

## 2015-06-08 ENCOUNTER — Ambulatory Visit (INDEPENDENT_AMBULATORY_CARE_PROVIDER_SITE_OTHER): Payer: BLUE CROSS/BLUE SHIELD | Admitting: Family Medicine

## 2015-06-08 ENCOUNTER — Other Ambulatory Visit (INDEPENDENT_AMBULATORY_CARE_PROVIDER_SITE_OTHER): Payer: BLUE CROSS/BLUE SHIELD

## 2015-06-08 ENCOUNTER — Encounter: Payer: Self-pay | Admitting: Family Medicine

## 2015-06-08 VITALS — BP 124/84 | HR 87 | Ht 68.0 in | Wt 244.0 lb

## 2015-06-08 DIAGNOSIS — M129 Arthropathy, unspecified: Secondary | ICD-10-CM

## 2015-06-08 DIAGNOSIS — S8261XD Displaced fracture of lateral malleolus of right fibula, subsequent encounter for closed fracture with routine healing: Secondary | ICD-10-CM

## 2015-06-08 DIAGNOSIS — M19079 Primary osteoarthritis, unspecified ankle and foot: Secondary | ICD-10-CM

## 2015-06-08 NOTE — Patient Instructions (Addendum)
Good to see you and you are doing great! Ice is your friend Wear brace for next 3 weeks when doing a lot of activity  Keep working on the range of motion Continue the vitamin d at least 2000 IU daily Will take another 3-6 weeks to get better See me again in 4-6 weeks.

## 2015-06-08 NOTE — Assessment & Plan Note (Signed)
Likely will give him some discomfort overall. We discussed icing regimen and home exercises. Patient will come back and see me again in 4 weeks. Continuing to have discomfort we'll consider other different over-the-counter medications for arthritic changes.

## 2015-06-08 NOTE — Assessment & Plan Note (Signed)
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 Patient continues the lateral ankle splints as much as needed. Encourage him to continue the home exercises. We discussed compression sock as well as icing protocol. Patient will follow-up again in 4-6 weeks for further evaluation.

## 2015-06-08 NOTE — Progress Notes (Signed)
Pre visit review using our clinic review tool, if applicable. No additional management support is needed unless otherwise documented below in the visit note. 

## 2015-06-08 NOTE — Progress Notes (Signed)
[name] [name], [place] [address] Phone: [phone] Subjective:      CC: Right ankle pain f/u  [id number] Derrick [name] is a 65 y.o. male coming in with complaint of right ankle pain and swelling patient was seen by me and did have a lateral malleolus fracture. This was confirmed on ultrasound. 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 These were once again independently visualized by me again today. Patient was put in a brace, discussed vitamin D supplementation, icing protocol. Patient was given range of motion exercises. Patient states overall is a proximal only 80% better. Still has some soreness on the lateral aspect the ankle and still has some swelling. No longer having any discoloration. Able to do all daily activities fairly well. Has not been able to workout.       Past Medical History  Diagnosis Date  . Hyperlipidemia   . BPH with obstruction/lower urinary tract symptoms     +Hx of acute urinary retention/acute cystitis: responding fairly well to tamsulosin and finasteride as of 04/2014  . 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  Annotation: previous methicillin sensitive  staphylococcus aureus 2006 Qualifier: Diagnosis of  By: Johnnye Sima MD, Dellis Filbert  (ID Specialist)    . History of adenomatous polyp of colon     Dr. Oletta Lamas: last colonoscopy was 2012 and was normal.   5 yr recall.  [name] HTN (hypertension)   . Obesity, Class II, BMI 35-39.9   . Nephrolithiasis 2015    CT showed 2cm left sided stone which was stable since 2006 imaging and likely in renal parenchyma   Past Surgical History  Procedure Laterality Date  . Tonsillectomy    . Colonoscopy w/ polypectomy  2007 & 2012    Negative 2012; Dr Ollen Barges 2017  . Lumbar laminectomy      Dr Vertell Limber performed 2nd & 3rd procedures  (pt also has hx of fall and vertebral fracture years prior to his surgeries.   Social History  Substance Use Topics  . Smoking status: Former Smoker    Quit date: 05/22/1973  . Smokeless tobacco: None     Comment: 35 years ago as of 2013  . Alcohol Use: No   No Known Allergies Family History  Problem Relation Age of Onset  . Stroke Mother   . Cancer Mother 55    melanoma  . Cancer Father 48    lung cancer; [employer] (nonsmoker)  . Heart disease Maternal Grandmother   . Heart disease Maternal Grandfather   . Heart disease Paternal Grandmother   . Cancer Brother     bladder cancer     Past medical history, social, surgical and family history all reviewed in electronic medical record.   Review of Systems: No headache, visual changes, nausea, vomiting, diarrhea, constipation, dizziness, abdominal pain, skin rash, fevers, chills, night sweats, weight loss, swollen lymph nodes, body aches, joint swelling, muscle aches, chest pain, shortness of breath, mood changes.   Objective Blood pressure 124/84, pulse 87, height 5\' 8"  (1.727 m), weight 244 lb (110.678 kg), SpO2 98 %.  General: No apparent distress alert and oriented x3 mood and affect normal, dressed appropriately.  HEENT: Pupils equal, extraocular movements intact  Respiratory: Patient's speak in full sentences and does not appear short of breath  Cardiovascular: No lower extremity edema, non  tender, no erythema  Skin: Warm dry intact with no signs of infection or rash on extremities or on axial skeleton.  Abdomen: Soft nontender  Neuro: Cranial nerves II through XII are intact, neurovascularly intact in all extremities with 2+ DTRs and 2+ pulses.  Lymph: No lymphadenopathy of posterior or anterior cervical chain or axillae bilaterally.  Gait mild antalgic  MSK:  Non tender with full range of motion and good stability and symmetric strength and tone of shoulders, elbows, wrist, hip, knee and bilaterally.  Ankle:  Right Normal swelling over the lateral aspect of the ankle Range of motion is full in all directions. Strength is 5/5 in all directions. Stable lateral and medial ligaments; squeeze test and kleiger test unremarkable; Talar dome nontender; No pain at base of 5th MT; No tenderness over cuboid; No tenderness over N spot or navicular prominence Continue mild tenderness to palpation of the lateral malleolus Negative tarsal tunnel tinel's Able to walk 4 steps. Contralateral ankle unremarkable  MSK US performed of: Right ankle This study was ordered, performed, and interpreted by Charlann Boxer D.O.  Foot/Ankle:   All structures visualized.   Talar dome unremarkable  Ankle mortise without effusion. Peroneus longus and brevis tendons have hypoechoic changes within the tendon sheath but no true tear. Posterior tibialis, flexor hallucis longus, and flexor digitorum longus tendons unremarkable on long and transverse views without sheath effusions. Achilles tendon visualized along length of tendon and unremarkable on long and transverse views without sheath effusion. Anterior Talofibular Ligament chronic changes but intact  Callus formation over the lateral malleolus. Minimal dispersement still noted. Good healing interval noted  IMPRESSION:  Lateral malleolus fracture with healing interval but not completely healed     Impression and Recommendations:     This case required medical decision making of moderate complexity.

## 2015-06-21 ENCOUNTER — Other Ambulatory Visit: Payer: Self-pay | Admitting: *Deleted

## 2015-06-21 MED ORDER — METOPROLOL SUCCINATE ER 25 MG PO TB24: ORAL_TABLET | ORAL | Status: DC

## 2015-06-21 NOTE — Telephone Encounter (Signed)
RF request for metoprolol LOV: 01/06/15 Next ov: 07/12/15 Last writte: 12/31/14 #90 w/ 1RF

## 2015-07-05 ENCOUNTER — Other Ambulatory Visit (INDEPENDENT_AMBULATORY_CARE_PROVIDER_SITE_OTHER): Payer: BLUE CROSS/BLUE SHIELD

## 2015-07-05 DIAGNOSIS — Z0000 Encounter for general adult medical examination without abnormal findings: Secondary | ICD-10-CM

## 2015-07-05 LAB — LIPID PANEL
CHOLESTEROL: 179 mg/dL (ref 0–200)
HDL: 46.6 mg/dL (ref >39.00)
LDL Cholesterol: 108 mg/dL — ABNORMAL HIGH (ref 0–99)
NonHDL: 132.79
Total CHOL/HDL Ratio: 4
Triglycerides: 124 mg/dL (ref 0.0–149.0)
VLDL: 24.8 mg/dL (ref 0.0–40.0)

## 2015-07-05 LAB — COMPREHENSIVE METABOLIC PANEL
ALBUMIN: 4.4 g/dL (ref 3.5–5.2)
ALK PHOS: 62 U/L (ref 39–117)
ALT: 15 U/L (ref 0–53)
AST: 14 U/L (ref 0–37)
BUN: 16 mg/dL (ref 6–23)
CO2: 28 mEq/L (ref 19–32)
Calcium: 9.4 mg/dL (ref 8.4–10.5)
Chloride: 103 mEq/L (ref 96–112)
Creatinine, Ser: 1.03 mg/dL (ref 0.40–1.50)
GFR: 76.63 mL/min (ref >60.00)
Glucose, Bld: 96 mg/dL (ref 70–99)
POTASSIUM: 4.7 meq/L (ref 3.5–5.1)
SODIUM: 139 meq/L (ref 135–145)
TOTAL PROTEIN: 7.2 g/dL (ref 6.0–8.3)
Total Bilirubin: 0.6 mg/dL (ref 0.2–1.2)

## 2015-07-05 LAB — CBC WITH DIFFERENTIAL/PLATELET
Basophils Absolute: 0 10*3/uL (ref 0.0–0.1)
Basophils Relative: 0.6 % (ref 0.0–3.0)
EOS PCT: 2.9 % (ref 0.0–5.0)
Eosinophils Absolute: 0.2 10*3/uL (ref 0.0–0.7)
HEMATOCRIT: 53.9 % — AB (ref 39.0–52.0)
HEMOGLOBIN: 17.9 g/dL — AB (ref 13.0–17.0)
LYMPHS PCT: 33.9 % (ref 12.0–46.0)
Lymphs Abs: 2.5 10*3/uL (ref 0.7–4.0)
MCHC: 33.2 g/dL (ref 30.0–36.0)
MCV: 89.6 fl (ref 78.0–100.0)
MONO ABS: 0.9 10*3/uL (ref 0.1–1.0)
MONOS PCT: 12.1 % — AB (ref 3.0–12.0)
Neutro Abs: 3.7 10*3/uL (ref 1.4–7.7)
Neutrophils Relative %: 50.5 % (ref 43.0–77.0)
Platelets: 172 10*3/uL (ref 150.0–400.0)
RBC: 6.01 Mil/uL — AB (ref 4.22–5.81)
RDW: 15.2 % (ref 11.5–15.5)
WBC: 7.3 10*3/uL (ref 4.0–10.5)

## 2015-07-05 LAB — TSH: TSH: 1.4 u[IU]/mL (ref 0.35–4.50)

## 2015-07-06 ENCOUNTER — Encounter: Payer: Self-pay | Admitting: Family Medicine

## 2015-07-06 ENCOUNTER — Ambulatory Visit (INDEPENDENT_AMBULATORY_CARE_PROVIDER_SITE_OTHER): Payer: BLUE CROSS/BLUE SHIELD | Admitting: Family Medicine

## 2015-07-06 VITALS — BP 132/80 | HR 84 | Wt 248.0 lb

## 2015-07-06 DIAGNOSIS — S8261XD Displaced fracture of lateral malleolus of right fibula, subsequent encounter for closed fracture with routine healing: Secondary | ICD-10-CM | POA: Diagnosis not present

## 2015-07-06 NOTE — Progress Notes (Signed)
[name] [address] [place], [place] [address] Phone: [phone] Subjective:      CC: Right ankle pain f/u  [id number] Derrick [name] is a 65 y.o. male coming in with complaint of right ankle pain and swelling patient was seen by me and did have a lateral malleolus fracture. Patient was doing fairly well with conservative therapy and was put in a brace. Patient states since been doing home exercises and somewhat the icing protocol patient states that he is not having any pain with his regular daily activities. Patient does notice if he does a significant amount walking or plays golf he has some mild discomfort. No swelling. No numbness. Very happy with the results. Has not needed any pain medication in quite some time.       Past Medical History  Diagnosis Date  . Hyperlipidemia   . BPH with obstruction/lower urinary tract symptoms     +Hx of acute urinary retention/acute cystitis: responding fairly well to tamsulosin and finasteride as of 04/2014  . 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  Annotation: previous methicillin sensitive  staphylococcus aureus 2006 Qualifier: Diagnosis of  By: Johnnye Sima MD, Dellis Filbert  (ID Specialist)    . History of adenomatous polyp of colon     Dr. Oletta Lamas: last colonoscopy was 2012 and was normal.   5 yr recall.  [name] HTN (hypertension)   . Obesity, Class II, BMI 35-39.9   . Nephrolithiasis 2015    CT showed 2cm left sided stone which was stable since 2006 imaging and likely in renal parenchyma   Past Surgical History  Procedure Laterality Date  . Tonsillectomy    . Colonoscopy w/ polypectomy  2007 & 2012    Negative 2012; Dr Ollen Barges 2017  . Lumbar laminectomy      Dr Vertell Limber performed 2nd & 3rd procedures (pt also has hx of fall and vertebral fracture years prior to his surgeries.   Social History  Substance Use Topics  . Smoking status: Former Smoker    Quit  date: 05/22/1973  . Smokeless tobacco: None     Comment: 35 years ago as of 2013  . Alcohol Use: No   No Known Allergies Family History  Problem Relation Age of Onset  . Stroke Mother   . Cancer Mother 85    melanoma  . Cancer Father 84    lung cancer; [employer] 2 (nonsmoker)  . Heart disease Maternal Grandmother   . Heart disease Maternal Grandfather   . Heart disease Paternal Grandmother   . Cancer Brother     bladder cancer     Past medical history, social, surgical and family history all reviewed in electronic medical record.   Review of Systems: No headache, visual changes, nausea, vomiting, diarrhea, constipation, dizziness, abdominal pain, skin rash, fevers, chills, night sweats, weight loss, swollen lymph nodes, body aches, joint swelling, muscle aches, chest pain, shortness of breath, mood changes.   Objective Blood pressure 132/80, pulse 84, weight 248 lb (112.492 kg), SpO2 94 %.  General: No apparent distress alert and oriented x3 mood and affect normal, dressed appropriately.  HEENT: Pupils equal, extraocular movements intact  Respiratory: Patient's speak in full sentences and does not appear short of breath  Cardiovascular: No lower extremity edema, non tender, no erythema  Skin: Warm dry intact with no signs of infection or rash on extremities or on axial skeleton.  Abdomen: Soft nontender  Neuro:  Cranial nerves II through XII are intact, neurovascularly intact in all extremities with 2+ DTRs and 2+ pulses.  Lymph: No lymphadenopathy of posterior or anterior cervical chain or axillae bilaterally.  Gait mild antalgic  MSK:  Non tender with full range of motion and good stability and symmetric strength and tone of shoulders, elbows, wrist, hip, knee and bilaterally.  Ankle: Right Normal swelling over the lateral aspect of the ankle Range of motion is full in all directions. Strength is 5/5 in all directions. Stable lateral and medial ligaments; squeeze test  and kleiger test unremarkable; Talar dome nontender; No pain at base of 5th MT; No tenderness over cuboid; No tenderness over N spot or navicular prominence Nontender over the lateral malleolus and affect Negative tarsal tunnel tinel's Able to walk 4 steps. Contralateral ankle unremarkable       Impression and Recommendations:     This case required medical decision making of moderate complexity.

## 2015-07-06 NOTE — Assessment & Plan Note (Signed)
Completely better at this time. No restrictions. Patient has anti-inflammatories for pain relief if necessary as well as encourage him to continue the range of motion exercises. Patient will follow-up as needed.

## 2015-07-06 NOTE — Patient Instructions (Signed)
Good to see yo u You are doing great  Continue the brace only if you need it Ice if it gives you trouble See me again when you need me and enjoy the boat show.

## 2015-07-12 ENCOUNTER — Encounter: Payer: BLUE CROSS/BLUE SHIELD | Admitting: Family Medicine

## 2015-07-13 ENCOUNTER — Encounter: Payer: Self-pay | Admitting: Family Medicine

## 2015-07-13 ENCOUNTER — Ambulatory Visit (INDEPENDENT_AMBULATORY_CARE_PROVIDER_SITE_OTHER): Payer: BLUE CROSS/BLUE SHIELD | Admitting: Family Medicine

## 2015-07-13 VITALS — BP 128/80 | HR 94 | Temp 97.7°F | Ht 68.0 in | Wt 250.5 lb

## 2015-07-13 DIAGNOSIS — Z0000 Encounter for general adult medical examination without abnormal findings: Secondary | ICD-10-CM

## 2015-07-13 NOTE — Progress Notes (Signed)
Pre visit review using our clinic review tool, if applicable. No additional management support is needed unless otherwise documented below in the visit note. 

## 2015-07-13 NOTE — Progress Notes (Signed)
Office Note 07/13/2015  CC: CPE  HPI:  Derrick [name] is a 65 y.o. White male who is here for annual health maintenance exam.  Feeling well. Says that while driving home from [place] recently he noted both ankles a little swollen.  Essentially resolved after he slept overnight.  Was eating a lot of high Na fast food while traveling.   No calf pain, no SOB.   Past Medical History  Diagnosis Date  . Hyperlipidemia   . BPH with obstruction/lower urinary tract symptoms     +Hx of acute urinary retention/acute cystitis: responding fairly well to tamsulosin and finasteride as of 04/2014  . 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  Annotation: previous methicillin sensitive  staphylococcus aureus 2006 Qualifier: Diagnosis of  By: Johnnye Sima MD, Dellis Filbert  (ID Specialist)    . History of adenomatous polyp of colon     Dr. Oletta Lamas: last colonoscopy was 2012 and was normal.   5 yr recall.  [name] HTN (hypertension)   . Obesity, Class II, BMI 35-39.9   . Nephrolithiasis 2015    CT showed 2cm left sided stone which was stable since 2006 imaging and likely in renal parenchyma    Past Surgical History  Procedure Laterality Date  . Tonsillectomy    . Colonoscopy w/ polypectomy  2007 & 2012    Negative 2012; Dr Ollen Barges 2017  . Lumbar laminectomy      Dr Vertell Limber performed 2nd & 3rd procedures (pt also has hx of fall and vertebral fracture years prior to his surgeries.    Family History  Problem Relation Age of Onset  . Stroke Mother   . Cancer Mother 59    melanoma  . Cancer Father 54    lung cancer; [employer] 2 (nonsmoker)  . Heart disease Maternal Grandmother   . Heart disease Maternal Grandfather   . Heart disease Paternal Grandmother   . Cancer Brother     bladder cancer    Social History   Social History  . Marital Status: Married    Spouse Name: N/A  . Number of Children: N/A  . Years of Education: N/A   Occupational  History  . Not on file.   Social History Main Topics  . Smoking status: Former Smoker    Quit date: 05/22/1973  . Smokeless tobacco: Not on file     Comment: 35 years ago as of 2013  . Alcohol Use: No  . Drug Use: No  . Sexual Activity: Not on file   Other Topics Concern  . Not on file   Social History Narrative   Married, 1 son and 2 grandchildren.   Orig from [place] area, [employer].   Retired Electrical engineer (age 21).   [place]  Belongs to [employer].             Outpatient Prescriptions Prior to Visit  Medication Sig Dispense Refill  . aspirin 81 MG tablet Take 160 mg by mouth daily.    . finasteride (PROSCAR) 5 MG tablet Take 5 mg by mouth daily.    [name] losartan (COZAAR) 50 MG tablet Take 1 tablet (50 mg total) by mouth daily. 90 tablet 3  . meloxicam (MOBIC) 15 MG tablet Reported on 05/14/2015    . metoprolol succinate (TOPROL-XL) 25 MG 24 hr tablet TAKE 1 TABLET BY MOUTH DAILY 90 tablet 3  . tamsulosin (FLOMAX) 0.4 MG CAPS capsule TAKE ONE CAPSULE BY MOUTH EVERY  DAY 90 capsule 1  . oxyCODONE-acetaminophen (PERCOCET) 10-325 MG tablet Take 1 tablet by mouth every 6 (six) hours as needed. Reported on 07/13/2015  0  . tiZANidine (ZANAFLEX) 2 MG tablet Reported on 07/13/2015  1  . traMADol (ULTRAM) 50 MG tablet Take 50 mg by mouth every 6 (six) hours as needed. Reported on 07/13/2015  0   No facility-administered medications prior to visit.    No Known Allergies  ROS Review of Systems  Constitutional: Negative for fever, chills, appetite change and fatigue.  HENT: Negative for congestion, dental problem, ear pain and sore throat.   Eyes: Negative for discharge, redness and visual disturbance.  Respiratory: Positive for cough. Negative for chest tightness, shortness of breath and wheezing. Choking: occ cough after having a cold that resolved over a week ago: coughs a little q 2 hrs.   Cardiovascular: Negative for chest pain, palpitations and leg  swelling.  Gastrointestinal: Negative for nausea, vomiting, abdominal pain, diarrhea and blood in stool.       GERD lately  Genitourinary: Negative for dysuria, urgency, frequency, hematuria, flank pain and difficulty urinating.  Musculoskeletal: Negative for myalgias, back pain, joint swelling, arthralgias and neck stiffness.  Skin: Negative for pallor and rash.  Neurological: Negative for dizziness, speech difficulty, weakness and headaches.  Hematological: Negative for adenopathy. Does not bruise/bleed easily.  Psychiatric/Behavioral: Negative for confusion and sleep disturbance. The patient is not nervous/anxious.     PE; Blood pressure 128/80, pulse 94, temperature 97.7 F (36.5 C), temperature source Oral, height 5\' 8"  (1.727 m), weight 250 lb 8 oz (113.626 kg), SpO2 95 %. Gen: Alert, well appearing.  Patient is oriented to person, place, time, and situation. AFFECT: pleasant, lucid thought and speech. ENT: Ears: EACs clear, normal epithelium.  TMs with good light reflex and landmarks bilaterally.  Eyes: no injection, icteris, swelling, or exudate.  EOMI, PERRLA. Nose: no drainage or turbinate edema/swelling.  No injection or focal lesion.  Mouth: lips without lesion/swelling.  Oral mucosa pink and moist.  Dentition intact and without obvious caries or gingival swelling.  Oropharynx without erythema, exudate, or swelling.  Neck: supple/nontender.  No LAD, mass, or TM.  Carotid pulses 2+ bilaterally, without bruits. CV: RRR, no m/r/g.   LUNGS: CTA bilat, nonlabored resps, good aeration in all lung fields. ABD: soft, NT, ND, BS normal.  No hepatospenomegaly or mass.  No bruits. EXT: no clubbing, cyanosis, or edema.  Musculoskeletal: no joint swelling, erythema, warmth, or tenderness.  ROM of all joints intact. Skin - no sores or suspicious lesions or rashes or color changes  Pertinent labs:  Lab Results  Component Value Date   TSH 1.40 07/05/2015   Lab Results  Component Value  Date   WBC 7.3 07/05/2015   HGB 17.9* 07/05/2015   HCT 53.9* 07/05/2015   MCV 89.6 07/05/2015   PLT 172.0 07/05/2015   Lab Results  Component Value Date   CREATININE 1.03 07/05/2015   BUN 16 07/05/2015   NA 139 07/05/2015   K 4.7 07/05/2015   CL 103 07/05/2015   CO2 28 07/05/2015   Lab Results  Component Value Date   ALT 15 07/05/2015   AST 14 07/05/2015   ALKPHOS 62 07/05/2015   BILITOT 0.6 07/05/2015   Lab Results  Component Value Date   CHOL 179 07/05/2015   Lab Results  Component Value Date   HDL 46.60 07/05/2015   Lab Results  Component Value Date   LDLCALC 108* 07/05/2015   Lab  Results  Component Value Date   TRIG 124.0 07/05/2015   Lab Results  Component Value Date   CHOLHDL 4 07/05/2015   Lab Results  Component Value Date   PSA 1.95 06/21/2011   PSA 1.91 06/21/2010   PSA 1.53 06/25/2008   ASSESSMENT AND PLAN:   Health maintenance exam: Reviewed age and gender appropriate health maintenance issues (prudent diet, regular exercise, health risks of tobacco and excessive alcohol, use of seatbelts, fire alarms in home, use of sunscreen).  Also reviewed age and gender appropriate health screening as well as vaccine recommendations. 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 Having GERD lately.  Discussed diet.  REcommended trial of either OTC H2 blocker or OTC PPI. Recent LE edema: venous insufficiency + excessive sodium intake---reassured pt.  This has resolved. Reviewed fasting health panel labs: all were good. Colon cancer screening: next colonoscopy this year with Dr. Oletta Lamas. He gets prostate ca screening through his urologist.  An After Visit Summary was printed and given to the patient.  FOLLOW UP:  Return in about 6 months (around 01/10/2016) for routine chronic illness f/u.

## 2015-10-01 ENCOUNTER — Other Ambulatory Visit: Payer: Self-pay | Admitting: *Deleted

## 2015-10-01 NOTE — Telephone Encounter (Signed)
Opened in error

## 2015-10-08 ENCOUNTER — Other Ambulatory Visit: Payer: Self-pay | Admitting: *Deleted

## 2015-10-08 MED ORDER — TAMSULOSIN HCL 0.4 MG PO CAPS: ORAL_CAPSULE | ORAL | Status: DC

## 2015-10-08 NOTE — Telephone Encounter (Signed)
RF request for tamsulosin LOV: 07/13/15 Next ov: None Last written: 04/02/15 #90 w/ 1RF

## 2015-10-12 HISTORY — PX: COLONOSCOPY: SHX174

## 2015-10-12 LAB — HM COLONOSCOPY

## 2015-10-21 ENCOUNTER — Encounter: Payer: Self-pay | Admitting: Family Medicine

## 2015-10-21 ENCOUNTER — Encounter: Payer: Self-pay | Admitting: *Deleted

## 2015-10-29 ENCOUNTER — Other Ambulatory Visit: Payer: Self-pay | Admitting: *Deleted

## 2015-10-29 MED ORDER — FINASTERIDE 5 MG PO TABS: 5.0000 mg | ORAL_TABLET | Freq: Every day | ORAL | Status: DC

## 2015-10-29 NOTE — Telephone Encounter (Signed)
Pts wife LMOM on 10/28/15 at 2:17pm requesting refill for finasteride.  RF request for finasteride LOV: 07/13/15 Next ov: None Last written: unknown  Rx sent for #90 w/ 1Rf. Pts wife advised and voiced understanding.

## 2016-03-03 ENCOUNTER — Ambulatory Visit (INDEPENDENT_AMBULATORY_CARE_PROVIDER_SITE_OTHER): Payer: Medicare Other

## 2016-03-03 DIAGNOSIS — Z23 Encounter for immunization: Secondary | ICD-10-CM

## 2016-05-01 ENCOUNTER — Other Ambulatory Visit: Payer: Self-pay | Admitting: Family Medicine

## 2016-05-01 NOTE — Telephone Encounter (Signed)
RF request for tamsulosin LOV: 07/13/15 Next ov: None Last written: 10/08/15 #90 w/ 1RF  RF request for losartan Last written: 03/29/15 #90 w/ 3RF  RF request for finasteride Last written: 10/29/15 #90 w/ 1RF  Rx's sent for #90 w/ 0Rf. Pt is due for f/u [facility], needs office visit for more refills.

## 2016-05-03 NOTE — Telephone Encounter (Signed)
Pt advised and voiced understanding.  Apt for AWE made with Maudie Mercury and f/u [facility] w/ Dr. Anitra Lauth on 06/27/15

## 2016-05-16 ENCOUNTER — Ambulatory Visit (INDEPENDENT_AMBULATORY_CARE_PROVIDER_SITE_OTHER): Payer: Medicare Other | Admitting: Family Medicine

## 2016-05-16 ENCOUNTER — Encounter: Payer: Self-pay | Admitting: Family Medicine

## 2016-05-16 ENCOUNTER — Telehealth: Payer: Self-pay | Admitting: Family Medicine

## 2016-05-16 VITALS — BP 122/80 | HR 85 | Temp 97.3°F | Resp 16 | Ht 68.0 in | Wt 252.5 lb

## 2016-05-16 DIAGNOSIS — J209 Acute bronchitis, unspecified: Secondary | ICD-10-CM | POA: Diagnosis not present

## 2016-05-16 DIAGNOSIS — J069 Acute upper respiratory infection, unspecified: Secondary | ICD-10-CM | POA: Diagnosis not present

## 2016-05-16 MED ORDER — MELOXICAM 15 MG PO TABS: ORAL_TABLET | ORAL | 6 refills | Status: DC

## 2016-05-16 MED ORDER — AZITHROMYCIN 250 MG PO TABS: ORAL_TABLET | ORAL | 0 refills | Status: DC

## 2016-05-16 MED ORDER — PREDNISONE 20 MG PO TABS
ORAL_TABLET | ORAL | 0 refills | Status: DC
Start: 2016-05-16 — End: 2016-05-30

## 2016-05-16 NOTE — Telephone Encounter (Signed)
Patient has cold/cough x 1 week. Can he get Rx for Zpak? He uses [facility].

## 2016-05-16 NOTE — Telephone Encounter (Signed)
Sorry, must be seen in order to determine dx and appropriate treatment.-thx

## 2016-05-16 NOTE — Progress Notes (Signed)
OFFICE VISIT  05/16/2016   CC:  Chief Complaint  Patient presents with  . URI    x 7-8 days   HPI:    Patient is a 65 y.o. Caucasian male who presents for respiratory complaints. Onset about 1 week ago, cough, runny nose, sneezing.  The cough has continued.  No ST or HA or fever. ? Wheezing.  No SOB or chest tightness.  Eating and drinking well.  No fatigue or body aches except chronic R LB pain. Sick contact recently. He has had flu vaccine this season.  Past Medical History:  Diagnosis Date  . BPH with obstruction/lower urinary tract symptoms    +Hx of acute urinary retention/acute cystitis: responding fairly well to tamsulosin and finasteride as of 04/2014  . History of adenomatous polyp of colon    Dr. Oletta Lamas: last colonoscopy was 2017 and was normal.   5 yr recall.  [name] HTN (hypertension)   . Hyperlipidemia   . Nephrolithiasis 2015   CT showed 2cm left sided stone which was stable since 2006 imaging and likely in renal parenchyma  . Obesity, Class II, BMI 35-39.9   . OSTEOMYELITIS, CHRONIC 02/21/2006   Staph infection s/p lumbar surgery (surgery x 3).  Annotation: previous methicillin sensitive  staphylococcus aureus 2006 Qualifier: Diagnosis of  By: Johnnye Sima MD, Dellis Filbert  (ID Specialist)      Past Surgical History:  Procedure Laterality Date  . COLONOSCOPY  10/12/2015   repeat 5 years  . COLONOSCOPY W/ POLYPECTOMY  2007    Negative 2012 and 2017; Dr Ollen Barges 2022.  . LUMBAR LAMINECTOMY     Dr Vertell Limber performed 2nd & 3rd procedures (pt also has hx of fall and vertebral fracture years prior to his surgeries.  . TONSILLECTOMY      Outpatient Medications Prior to Visit  Medication Sig Dispense Refill  . aspirin 81 MG tablet Take 160 mg by mouth daily.    . finasteride (PROSCAR) 5 MG tablet TAKE 1 TABLET BY MOUTH DAILY 90 tablet 0  . losartan (COZAAR) 50 MG tablet TAKE 1 TABLET BY MOUTH DAILY 90 tablet 0  . metoprolol succinate (TOPROL-XL) 25 MG 24 hr tablet TAKE 1  TABLET BY MOUTH DAILY 90 tablet 3  . oxyCODONE-acetaminophen (PERCOCET) 10-325 MG tablet Take 1 tablet by mouth every 6 (six) hours as needed. Reported on 07/13/2015  0  . tamsulosin (FLOMAX) 0.4 MG CAPS capsule TAKE ONE CAPSULE BY MOUTH EVERY DAY 90 capsule 0  . tiZANidine (ZANAFLEX) 2 MG tablet Reported on 07/13/2015  1  . traMADol (ULTRAM) 50 MG tablet Take 50 mg by mouth every 6 (six) hours as needed. Reported on 07/13/2015  0  . meloxicam (MOBIC) 15 MG tablet Reported on 05/14/2015     No facility-administered medications prior to visit.     No Known Allergies  ROS As per HPI  PE: Blood pressure 122/80, pulse 85, temperature 97.3 F (36.3 C), temperature source Oral, resp. rate 16, height 5\' 8"  (1.727 m), weight 252 lb 8 oz (114.5 kg), SpO2 95 %. VS: noted--normal. Gen: alert, NAD, NONTOXIC APPEARING. HEENT: eyes without injection, drainage, or swelling.  Ears: EACs clear, TMs with normal light reflex and landmarks.  Nose: Clear rhinorrhea, with some dried, crusty exudate adherent to mildly injected mucosa.  No purulent d/c.  No paranasal sinus TTP.  No facial swelling.  Throat and mouth without focal lesion.  No pharyngial swelling, erythema, or exudate.   Neck: supple, no LAD.   LUNGS: CTA bilat,  nonlabored resps.  Trace end exp wheezing diffusely.  Aeration is good.  Exp phase is minimally prolonged. CV: RRR, no m/r/g. EXT: no c/c/e SKIN: no rash   LABS:  none  IMPRESSION AND PLAN:  URI, with acute bronchitis. Some evidence of mild RAD. Prednisone 40mg  qd x 5d. Azith x 5d. Continue mucinex DM otc. Push fluids, rest.  An After Visit Summary was printed and given to the patient.  FOLLOW UP: No Follow-up on file.  Signed:  Crissie Sickles, MD           05/16/2016

## 2016-05-16 NOTE — Progress Notes (Signed)
Pre visit review using our clinic review tool, if applicable. No additional management support is needed unless otherwise documented below in the visit note. 

## 2016-05-16 NOTE — Telephone Encounter (Signed)
Diane advised pt that he would need an appointment. Apt made at 9:00am today.

## 2016-05-29 ENCOUNTER — Telehealth: Payer: Self-pay | Admitting: Family Medicine

## 2016-05-29 NOTE — Telephone Encounter (Signed)
Patient still has cold symptoms. He has completed his antibiotics. Please call him.

## 2016-05-29 NOTE — Telephone Encounter (Signed)
Called patient, he stated that he was no better.  Instructed patient to schedule an appointment, Appointment scheduled by [facility]

## 2016-05-30 ENCOUNTER — Ambulatory Visit (INDEPENDENT_AMBULATORY_CARE_PROVIDER_SITE_OTHER): Payer: Medicare Other | Admitting: Family Medicine

## 2016-05-30 ENCOUNTER — Encounter: Payer: Self-pay | Admitting: Family Medicine

## 2016-05-30 VITALS — BP 132/78 | HR 95 | Temp 98.2°F | Resp 16 | Ht 68.0 in | Wt 253.2 lb

## 2016-05-30 DIAGNOSIS — J069 Acute upper respiratory infection, unspecified: Secondary | ICD-10-CM | POA: Diagnosis not present

## 2016-05-30 DIAGNOSIS — B9789 Other viral agents as the cause of diseases classified elsewhere: Secondary | ICD-10-CM | POA: Diagnosis not present

## 2016-05-30 DIAGNOSIS — J208 Acute bronchitis due to other specified organisms: Secondary | ICD-10-CM | POA: Diagnosis not present

## 2016-05-30 MED ORDER — BENZONATATE 200 MG PO CAPS: 200.0000 mg | ORAL_CAPSULE | Freq: Two times a day (BID) | ORAL | 1 refills | Status: DC | PRN

## 2016-05-30 MED ORDER — HYDROCODONE-HOMATROPINE 5-1.5 MG/5ML PO SYRP: ORAL_SOLUTION | ORAL | 0 refills | Status: DC

## 2016-05-30 NOTE — Progress Notes (Signed)
Pre visit review using our clinic review tool, if applicable. No additional management support is needed unless otherwise documented below in the visit note. 

## 2016-05-30 NOTE — Progress Notes (Signed)
OFFICE VISIT  05/30/2016   CC:  Chief Complaint  Patient presents with  . URI    was seen on 05/16/16, treated with zpak and prednisone, pt states that he has not improved   HPI:    Patient is a 66 y.o. Caucasian male who presents for cough. I saw him 2 weeks ago and dx'd him with URI with acute bronchitis and prescribed 10d of prednisone as well as a z pack.  He took all this med and feels no better. Nasal congestion/sinus pressure is slightly improved.  Says cough is unchanged.  Hurting in chest a lot when he coughs.  No fever.  He thinks he can hear himself wheezing but I'm unsure if he hears this in upper airway or chest.  Denies SOB.  No hemoptysis or purulent sputum.  No body aches or malaise.  Past Medical History:  Diagnosis Date  . BPH with obstruction/lower urinary tract symptoms    +Hx of acute urinary retention/acute cystitis: responding fairly well to tamsulosin and finasteride as of 04/2014  . History of adenomatous polyp of colon    Dr. Oletta Lamas: last colonoscopy was 2017 and was normal.   5 yr recall.  [name] HTN (hypertension)   . Hyperlipidemia   . Nephrolithiasis 2015   CT showed 2cm left sided stone which was stable since 2006 imaging and likely in renal parenchyma  . Obesity, Class II, BMI 35-39.9   . OSTEOMYELITIS, CHRONIC 02/21/2006   Staph infection s/p lumbar surgery (surgery x 3).  Annotation: previous methicillin sensitive  staphylococcus aureus 2006 Qualifier: Diagnosis of  By: Johnnye Sima MD, Dellis Filbert  (ID Specialist)      Past Surgical History:  Procedure Laterality Date  . COLONOSCOPY  10/12/2015   repeat 5 years  . COLONOSCOPY W/ POLYPECTOMY  2007    Negative 2012 and 2017; Dr Ollen Barges 2022.  . LUMBAR LAMINECTOMY     Dr Vertell Limber performed 2nd & 3rd procedures (pt also has hx of fall and vertebral fracture years prior to his surgeries.  . TONSILLECTOMY      Outpatient Medications Prior to Visit  Medication Sig Dispense Refill  . aspirin 81 MG tablet  Take 160 mg by mouth daily.    . finasteride (PROSCAR) 5 MG tablet TAKE 1 TABLET BY MOUTH DAILY 90 tablet 0  . losartan (COZAAR) 50 MG tablet TAKE 1 TABLET BY MOUTH DAILY 90 tablet 0  . meloxicam (MOBIC) 15 MG tablet 1 tab po qd prn musculoskeletal pain 30 tablet 6  . metoprolol succinate (TOPROL-XL) 25 MG 24 hr tablet TAKE 1 TABLET BY MOUTH DAILY 90 tablet 3  . oxyCODONE-acetaminophen (PERCOCET) 10-325 MG tablet Take 1 tablet by mouth every 6 (six) hours as needed. Reported on 07/13/2015  0  . tamsulosin (FLOMAX) 0.4 MG CAPS capsule TAKE ONE CAPSULE BY MOUTH EVERY DAY 90 capsule 0  . tiZANidine (ZANAFLEX) 2 MG tablet Reported on 07/13/2015  1  . traMADol (ULTRAM) 50 MG tablet Take 50 mg by mouth every 6 (six) hours as needed. Reported on 07/13/2015  0  . azithromycin (ZITHROMAX) 250 MG tablet 2 tabs po qd x 1d, then 1 tab po qd x 4d (Patient not taking: Reported on 05/30/2016) 6 tablet 0  . predniSONE (DELTASONE) 20 MG tablet 2 tabs po qd x 5d (Patient not taking: Reported on 05/30/2016) 10 tablet 0   No facility-administered medications prior to visit.     No Known Allergies  ROS As per HPI  PE: Blood pressure  132/78, pulse 95, temperature 98.2 F (36.8 C), temperature source Oral, resp. rate 16, height 5\' 8"  (1.727 m), weight 253 lb 4 oz (114.9 kg), SpO2 96 %. VS: noted--normal. Gen: alert, NAD, NONTOXIC APPEARING. HEENT: eyes without injection, drainage, or swelling.  Ears: EACs clear, TMs with normal light reflex and landmarks.  Nose: Clear rhinorrhea, with some dried, crusty exudate adherent to mildly injected mucosa.  No purulent d/c.  No paranasal sinus TTP.  No facial swelling.  Throat and mouth without focal lesion.  No pharyngial swelling, erythema, or exudate.   Neck: supple, no LAD.   LUNGS: CTA bilat, nonlabored resps.   CV: RRR, no m/r/g. EXT: no c/c/e SKIN: no rash  LABS:  None today  IMPRESSION AND PLAN:  URI with acute bronchitis, little change with z pack and 10d  steroid taper. I reassured him that I think he has viral bronchitis.  I see no objective evidence to support bacterial infection so no further antibiotics recommended. We discussed trial of more systemic steroids but agreed this was unlikely to help now if it didn't help any the last 10d. We decided to change symptomatic treatment to tessalon perles 200 mg bid daytime, and hycodan syrup 1-2 tsp qhs prn cough.  Therapeutic expectations and side effect profile of medication discussed today.  Patient's questions answered.  An After Visit Summary was printed and given to the patient.  FOLLOW UP: Return if symptoms worsen or fail to improve.  Signed:  Crissie Sickles, MD           05/30/2016

## 2016-06-26 ENCOUNTER — Encounter: Payer: Self-pay | Admitting: Family Medicine

## 2016-06-26 ENCOUNTER — Ambulatory Visit (INDEPENDENT_AMBULATORY_CARE_PROVIDER_SITE_OTHER): Payer: Medicare Other | Admitting: Family Medicine

## 2016-06-26 VITALS — BP 137/90 | HR 97 | Temp 99.0°F | Resp 18 | Ht 68.0 in | Wt 258.0 lb

## 2016-06-26 DIAGNOSIS — Z23 Encounter for immunization: Secondary | ICD-10-CM | POA: Diagnosis not present

## 2016-06-26 DIAGNOSIS — E7800 Pure hypercholesterolemia, unspecified: Secondary | ICD-10-CM

## 2016-06-26 DIAGNOSIS — I10 Essential (primary) hypertension: Secondary | ICD-10-CM | POA: Diagnosis not present

## 2016-06-26 DIAGNOSIS — Z0000 Encounter for general adult medical examination without abnormal findings: Secondary | ICD-10-CM | POA: Diagnosis not present

## 2016-06-26 LAB — COMPREHENSIVE METABOLIC PANEL
ALT: 16 U/L (ref 0–53)
AST: 14 U/L (ref 0–37)
Albumin: 4.2 g/dL (ref 3.5–5.2)
Alkaline Phosphatase: 65 U/L (ref 39–117)
BUN: 17 mg/dL (ref 6–23)
CALCIUM: 9.4 mg/dL (ref 8.4–10.5)
CHLORIDE: 103 meq/L (ref 96–112)
CO2: 30 meq/L (ref 19–32)
CREATININE: 0.96 mg/dL (ref 0.40–1.50)
GFR: 82.86 mL/min (ref >60.00)
GLUCOSE: 96 mg/dL (ref 70–99)
Potassium: 4.4 mEq/L (ref 3.5–5.1)
SODIUM: 140 meq/L (ref 135–145)
Total Bilirubin: 0.6 mg/dL (ref 0.2–1.2)
Total Protein: 6.9 g/dL (ref 6.0–8.3)

## 2016-06-26 LAB — LDL CHOLESTEROL, DIRECT: LDL DIRECT: 133 mg/dL

## 2016-06-26 LAB — LIPID PANEL
Cholesterol: 211 mg/dL — ABNORMAL HIGH (ref 0–200)
HDL: 52.4 mg/dL (ref >39.00)
NONHDL: 159
Total CHOL/HDL Ratio: 4
Triglycerides: 220 mg/dL — ABNORMAL HIGH (ref 0.0–149.0)
VLDL: 44 mg/dL — AB (ref 0.0–40.0)

## 2016-06-26 NOTE — Progress Notes (Signed)
OFFICE VISIT  06/26/2016   CC:  Chief Complaint  Patient presents with  . Follow-up   HPI:    Patient is a 66 y.o. Caucasian male who presents for f/u HTN, HLD, BPH. Says he is feeling well now: took him 6 weeks to completely get over this. BPH under control with mgmt by urol, also prostate ca screening.  HTN: no recent bp monitoring.  He does have cuff.  Compliant with meds. BP control historically has been good.  Exercise: not in the last 3 mo.   Diet: not focusing on anything in particular.  ROS: waxing and waning chronic low back pain.   Past Medical History:  Diagnosis Date  . BPH with obstruction/lower urinary tract symptoms    +Hx of acute urinary retention/acute cystitis: responding fairly well to tamsulosin and finasteride as of 04/2014  . History of adenomatous polyp of colon    Dr. Oletta Lamas: last colonoscopy was 2017 and was normal.   5 yr recall.  [name] HTN (hypertension)   . Hyperlipidemia   . Nephrolithiasis 2015   CT showed 2cm left sided stone which was stable since 2006 imaging and likely in renal parenchyma  . Obesity, Class II, BMI 35-39.9   . OSTEOMYELITIS, CHRONIC 02/21/2006   Staph infection s/p lumbar surgery (surgery x 3).  Annotation: previous methicillin sensitive  staphylococcus aureus 2006 Qualifier: Diagnosis of  By: Johnnye Sima MD, Dellis Filbert  (ID Specialist)      Past Surgical History:  Procedure Laterality Date  . COLONOSCOPY  10/12/2015   repeat 5 years  . COLONOSCOPY W/ POLYPECTOMY  2007    Negative 2012 and 2017; Dr Ollen Barges 2022.  . LUMBAR LAMINECTOMY     Dr Vertell Limber performed 2nd & 3rd procedures (pt also has hx of fall and vertebral fracture years prior to his surgeries.  . TONSILLECTOMY      Outpatient Medications Prior to Visit  Medication Sig Dispense Refill  . aspirin 81 MG tablet Take 160 mg by mouth daily.    . finasteride (PROSCAR) 5 MG tablet TAKE 1 TABLET BY MOUTH DAILY 90 tablet 0  . losartan (COZAAR) 50 MG tablet TAKE 1  TABLET BY MOUTH DAILY 90 tablet 0  . meloxicam (MOBIC) 15 MG tablet 1 tab po qd prn musculoskeletal pain 30 tablet 6  . metoprolol succinate (TOPROL-XL) 25 MG 24 hr tablet TAKE 1 TABLET BY MOUTH DAILY 90 tablet 3  . tamsulosin (FLOMAX) 0.4 MG CAPS capsule TAKE ONE CAPSULE BY MOUTH EVERY DAY 90 capsule 0  . oxyCODONE-acetaminophen (PERCOCET) 10-325 MG tablet Take 1 tablet by mouth every 6 (six) hours as needed. Reported on 07/13/2015  0  . tiZANidine (ZANAFLEX) 2 MG tablet Reported on 07/13/2015  1  . traMADol (ULTRAM) 50 MG tablet Take 50 mg by mouth every 6 (six) hours as needed. Reported on 07/13/2015  0  . benzonatate (TESSALON) 200 MG capsule Take 1 capsule (200 mg total) by mouth 2 (two) times daily as needed for cough. 20 capsule 1  . HYDROcodone-homatropine (HYCODAN) 5-1.5 MG/5ML syrup 1-2 tsp po qhs prn cough 180 mL 0   No facility-administered medications prior to visit.     No Known Allergies  ROS As per HPI  PE: Blood pressure 137/90, pulse 97, temperature 99 F (37.2 C), temperature source Temporal, resp. rate 18, height 5\' 8"  (1.727 m), weight 258 lb (117 kg), SpO2 94 %. Gen: Alert, well appearing.  Patient is oriented to person, place, time, and situation. CV: RRR, no  m/r/g.   LUNGS: CTA bilat, nonlabored resps, good aeration in all lung fields. EXT: no clubbing or cyanosis.  He has 2+ pitting edema in both LL's pretibal regions. No leg rash.  LABS:  Lab Results  Component Value Date   TSH 1.40 07/05/2015   Lab Results  Component Value Date   WBC 7.3 07/05/2015   HGB 17.9 (H) 07/05/2015   HCT 53.9 (H) 07/05/2015   MCV 89.6 07/05/2015   PLT 172.0 07/05/2015   Lab Results  Component Value Date   CREATININE 1.03 07/05/2015   BUN 16 07/05/2015   NA 139 07/05/2015   K 4.7 07/05/2015   CL 103 07/05/2015   CO2 28 07/05/2015   Lab Results  Component Value Date   ALT 15 07/05/2015   AST 14 07/05/2015   ALKPHOS 62 07/05/2015   BILITOT 0.6 07/05/2015   Lab  Results  Component Value Date   CHOL 179 07/05/2015   Lab Results  Component Value Date   HDL 46.60 07/05/2015   Lab Results  Component Value Date   LDLCALC 108 (H) 07/05/2015   Lab Results  Component Value Date   TRIG 124.0 07/05/2015   Lab Results  Component Value Date   CHOLHDL 4 07/05/2015   Lab Results  Component Value Date   PSA 1.95 06/21/2011   PSA 1.91 06/21/2010   PSA 1.53 06/25/2008    IMPRESSION AND PLAN:  1) HTN: The current medical regimen is effective;  continue present plan and medications. Recheck lytes/cr today.  2) Hx of hyperlipidemia, has never been on meds: his lipid panels have been better the last few years. Recheck lipid panel today + AST/ALT---he is not completely fasting (celery sticks and cheese dip 3 hours ago).  3) BPH and prostate cancer screening: stable.--as per urology--pt to keep approp f/u with urologist.   An After Visit Summary was printed and given to the patient.  FOLLOW UP: Return in about 6 months (around 12/24/2016) for routine chronic illness f/u.  Signed:  Crissie Sickles, MD           06/26/2016

## 2016-06-26 NOTE — Patient Instructions (Addendum)
Continue to eat heart healthy diet (full of fruits, vegetables, whole grains, lean protein, water--limit salt, fat, and sugar intake) and increase physical activity as tolerated.  Continue doing brain stimulating activities (puzzles, reading, adult coloring books, staying active) to keep memory sharp.   Bring a copy of your advance directives to your next office visit.  Make appointment with Urology.    Fall Prevention in the Home Introduction Falls can cause injuries. They can happen to people of all ages. There are many things you can do to make your home safe and to help prevent falls. What can I do on the outside of my home?  Regularly fix the edges of walkways and driveways and fix any cracks.  Remove anything that might make you trip as you walk through a door, such as a raised step or threshold.  Trim any bushes or trees on the path to your home.  Use bright outdoor lighting.  Clear any walking paths of anything that might make someone trip, such as rocks or tools.  Regularly check to see if handrails are loose or broken. Make sure that both sides of any steps have handrails.  Any raised decks and porches should have guardrails on the edges.  Have any leaves, snow, or ice cleared regularly.  Use sand or salt on walking paths during winter.  Clean up any spills in your garage right away. This includes oil or grease spills. What can I do in the bathroom?  Use night lights.  Install grab bars by the toilet and in the tub and shower. Do not use towel bars as grab bars.  Use non-skid mats or decals in the tub or shower.  If you need to sit down in the shower, use a plastic, non-slip stool.  Keep the floor dry. Clean up any water that spills on the floor as soon as it happens.  Remove soap buildup in the tub or shower regularly.  Attach bath mats securely with double-sided non-slip rug tape.  Do not have throw rugs and other things on the floor that can make you  trip. What can I do in the bedroom?  Use night lights.  Make sure that you have a light by your bed that is easy to reach.  Do not use any sheets or blankets that are too big for your bed. They should not hang down onto the floor.  Have a firm chair that has side arms. You can use this for support while you get dressed.  Do not have throw rugs and other things on the floor that can make you trip. What can I do in the kitchen?  Clean up any spills right away.  Avoid walking on wet floors.  Keep items that you use a lot in easy-to-reach places.  If you need to reach something above you, use a strong step stool that has a grab bar.  Keep electrical cords out of the way.  Do not use floor polish or wax that makes floors slippery. If you must use wax, use non-skid floor wax.  Do not have throw rugs and other things on the floor that can make you trip. What can I do with my stairs?  Do not leave any items on the stairs.  Make sure that there are handrails on both sides of the stairs and use them. Fix handrails that are broken or loose. Make sure that handrails are as long as the stairways.  Check any carpeting to make sure that  it is firmly attached to the stairs. Fix any carpet that is loose or worn.  Avoid having throw rugs at the top or bottom of the stairs. If you do have throw rugs, attach them to the floor with carpet tape.  Make sure that you have a light switch at the top of the stairs and the bottom of the stairs. If you do not have them, ask someone to add them for you. What else can I do to help prevent falls?  Wear shoes that:  Do not have high heels.  Have rubber bottoms.  Are comfortable and fit you well.  Are closed at the toe. Do not wear sandals.  If you use a stepladder:  Make sure that it is fully opened. Do not climb a closed stepladder.  Make sure that both sides of the stepladder are locked into place.  Ask someone to hold it for you, if  possible.  Clearly mark and make sure that you can see:  Any grab bars or handrails.  First and last steps.  Where the edge of each step is.  Use tools that help you move around (mobility aids) if they are needed. These include:  Canes.  Walkers.  Scooters.  Crutches.  Turn on the lights when you go into a dark area. Replace any light bulbs as soon as they burn out.  Set up your furniture so you have a clear path. Avoid moving your furniture around.  If any of your floors are uneven, fix them.  If there are any pets around you, be aware of where they are.  Review your medicines with your doctor. Some medicines can make you feel dizzy. This can increase your chance of falling. Ask your doctor what other things that you can do to help prevent falls. This information is not intended to replace advice given to you by your health care provider. Make sure you discuss any questions you have with your health care provider. Document Released: 03/04/2009 Document Revised: 10/14/2015 Document Reviewed: 06/12/2014  2017 Elsevier  Health Maintenance, Male A healthy lifestyle and preventative care can promote health and wellness.  Maintain regular health, dental, and eye exams.  Eat a healthy diet. Foods like vegetables, fruits, whole grains, low-fat dairy products, and lean protein foods contain the nutrients you need and are low in calories. Decrease your intake of foods high in solid fats, added sugars, and salt. Get information about a proper diet from your health care provider, if necessary.  Regular physical exercise is one of the most important things you can do for your health. Most adults should get at least 150 minutes of moderate-intensity exercise (any activity that increases your heart rate and causes you to sweat) each week. In addition, most adults need muscle-strengthening exercises on 2 or more days a week.   Maintain a healthy weight. The body mass index (BMI) is a  screening tool to identify possible weight problems. It provides an estimate of body fat based on height and weight. Your health care provider can find your BMI and can help you achieve or maintain a healthy weight. For males 20 years and older:  A BMI below 18.5 is considered underweight.  A BMI of 18.5 to 24.9 is normal.  A BMI of 25 to 29.9 is considered overweight.  A BMI of 30 and above is considered obese.  Maintain normal blood lipids and cholesterol by exercising and minimizing your intake of saturated fat. Eat a balanced diet with plenty  of fruits and vegetables. Blood tests for lipids and cholesterol should begin at age 14 and be repeated every 5 years. If your lipid or cholesterol levels are high, you are over age 43, or you are at high risk for heart disease, you may need your cholesterol levels checked more frequently.Ongoing high lipid and cholesterol levels should be treated with medicines if diet and exercise are not working.  If you smoke, find out from your health care provider how to quit. If you do not use tobacco, do not start.  Lung cancer screening is recommended for adults aged 75-80 years who are at high risk for developing lung cancer because of a history of smoking. A yearly low-dose CT scan of the lungs is recommended for people who have at least a 30-pack-year history of smoking and are current smokers or have quit within the past 15 years. A pack year of smoking is smoking an average of 1 pack of cigarettes a day for 1 year (for example, a 30-pack-year history of smoking could mean smoking 1 pack a day for 30 years or 2 packs a day for 15 years). Yearly screening should continue until the smoker has stopped smoking for at least 15 years. Yearly screening should be stopped for people who develop a health problem that would prevent them from having lung cancer treatment.  If you choose to drink alcohol, do not have more than 2 drinks per day. One drink is considered to be  12 oz (360 mL) of beer, 5 oz (150 mL) of wine, or 1.5 oz (45 mL) of liquor.  Avoid the use of street drugs. Do not share needles with anyone. Ask for help if you need support or instructions about stopping the use of drugs.  High blood pressure causes heart disease and increases the risk of stroke. High blood pressure is more likely to develop in:  People who have blood pressure in the end of the normal range (100-139/85-89 mm Hg).  People who are overweight or obese.  People who are African American.  If you are 106-15 years of age, have your blood pressure checked every 3-5 years. If you are 70 years of age or older, have your blood pressure checked every year. You should have your blood pressure measured twice-once when you are at a hospital or clinic, and once when you are not at a hospital or clinic. Record the average of the two measurements. To check your blood pressure when you are not at a hospital or clinic, you can use:  An automated blood pressure machine at a pharmacy.  A home blood pressure monitor.  If you are 64-16 years old, ask your health care provider if you should take aspirin to prevent heart disease.  Diabetes screening involves taking a blood sample to check your fasting blood sugar level. This should be done once every 3 years after age 53 if you are at a normal weight and without risk factors for diabetes. Testing should be considered at a younger age or be carried out more frequently if you are overweight and have at least 1 risk factor for diabetes.  Colorectal cancer can be detected and often prevented. Most routine colorectal cancer screening begins at the age of 72 and continues through age 42. However, your health care provider may recommend screening at an earlier age if you have risk factors for colon cancer. On a yearly basis, your health care provider may provide home test kits to check for hidden blood  in the stool. A small camera at the end of a tube may be  used to directly examine the colon (sigmoidoscopy or colonoscopy) to detect the earliest forms of colorectal cancer. Talk to your health care provider about this at age 54 when routine screening begins. A direct exam of the colon should be repeated every 5-10 years through age 74, unless early forms of precancerous polyps or small growths are found.  People who are at an increased risk for hepatitis B should be screened for this virus. You are considered at high risk for hepatitis B if:  You were born in a country where hepatitis B occurs often. Talk with your health care provider about which countries are considered high risk.  Your parents were born in a high-risk country and you have not received a shot to protect against hepatitis B (hepatitis B vaccine).  You have HIV or AIDS.  You use needles to inject street drugs.  You live with, or have sex with, someone who has hepatitis B.  You are a man who has sex with other men (MSM).  You get hemodialysis treatment.  You take certain medicines for conditions like cancer, organ transplantation, and autoimmune conditions.  Hepatitis C blood testing is recommended for all people born from [birth year] through 1967 and any individual with known risk factors for hepatitis C.  Healthy men should no longer receive prostate-specific antigen (PSA) blood tests as part of routine cancer screening. Talk to your health care provider about prostate cancer screening.  Testicular cancer screening is not recommended for adolescents or adult males who have no symptoms. Screening includes self-exam, a health care provider exam, and other screening tests. Consult with your health care provider about any symptoms you have or any concerns you have about testicular cancer.  Practice safe sex. Use condoms and avoid high-risk sexual practices to reduce the spread of sexually transmitted infections (STIs).  You should be screened for STIs, including gonorrhea and chlamydia  if:  You are sexually active and are younger than 24 years.  You are older than 24 years, and your health care provider tells you that you are at risk for this type of infection.  Your sexual activity has changed since you were last screened, and you are at an increased risk for chlamydia or gonorrhea. Ask your health care provider if you are at risk.  If you are at risk of being infected with HIV, it is recommended that you take a prescription medicine daily to prevent HIV infection. This is called pre-exposure prophylaxis (PrEP). You are considered at risk if:  You are a man who has sex with other men (MSM).  You are a heterosexual man who is sexually active with multiple partners.  You take drugs by injection.  You are sexually active with a partner who has HIV.  Talk with your health care provider about whether you are at high risk of being infected with HIV. If you choose to begin PrEP, you should first be tested for HIV. You should then be tested every 3 months for as long as you are taking PrEP.  Use sunscreen. Apply sunscreen liberally and repeatedly throughout the day. You should seek shade when your shadow is shorter than you. Protect yourself by wearing long sleeves, pants, a wide-brimmed hat, and sunglasses year round whenever you are outdoors.  Tell your health care provider of new moles or changes in moles, especially if there is a change in shape or color. Also, tell your  health care provider if a mole is larger than the size of a pencil eraser.  A one-time screening for abdominal aortic aneurysm (AAA) and surgical repair of large AAAs by ultrasound is recommended for men aged 69-75 years who are current or former smokers.  Stay current with your vaccines (immunizations). This information is not intended to replace advice given to you by your health care provider. Make sure you discuss any questions you have with your health care provider. Document Released: 11/04/2007  Document Revised: 05/29/2014 Document Reviewed: 02/09/2015 Elsevier Interactive Patient Education  2017 [employer].

## 2016-06-26 NOTE — Progress Notes (Signed)
Subjective:   Derrick [name] is a 65 y.o. male who presents for an Initial Medicare Annual Wellness Visit.  Review of Systems  No ROS.  Medicare Wellness Visit.    Sleep patterns: Sleeps 9-10 hours, up to void x 2.    Home Safety/Smoke Alarms:  Smoke detectors and security in place.  Living environment; residence and Firearm Safety: Lives with wife in 3 story home, rails in place. Firearms locked away.  Seat Belt Safety/Bike Helmet: Wears seat belt.   Counseling:   Eye Exam-Last exam 10/2014, with problems/changes only. Dr. Gwynn Burly  Dental-Last exam 12/2015, Dr. Lavonne Chick.   Male:   CCS-colonoscopy 10/12/2015, normal. Recall 5 years.      PSA-06/21/2011, 1.95. Followed by [facility].      Objective:    Today's Vitals   06/26/16 1116  BP: 137/90  Pulse: 97  Resp: 18  Temp: 99 F (37.2 C)  TempSrc: Temporal  SpO2: 94%  Weight: 258 lb (117 kg)  Height: 5\' 8"  (1.727 m)   Body mass index is 39.23 kg/m.  Current Medications (verified) Outpatient Encounter Prescriptions as of 06/26/2016  Medication Sig  . aspirin 81 MG tablet Take 160 mg by mouth daily.  . finasteride (PROSCAR) 5 MG tablet TAKE 1 TABLET BY MOUTH DAILY  . losartan (COZAAR) 50 MG tablet TAKE 1 TABLET BY MOUTH DAILY  . meloxicam (MOBIC) 15 MG tablet 1 tab po qd prn musculoskeletal pain  . metoprolol succinate (TOPROL-XL) 25 MG 24 hr tablet TAKE 1 TABLET BY MOUTH DAILY  . tamsulosin (FLOMAX) 0.4 MG CAPS capsule TAKE ONE CAPSULE BY MOUTH EVERY DAY  . oxyCODONE-acetaminophen (PERCOCET) 10-325 MG tablet Take 1 tablet by mouth every 6 (six) hours as needed. Reported on 07/13/2015  . tiZANidine (ZANAFLEX) 2 MG tablet Reported on 07/13/2015  . traMADol (ULTRAM) 50 MG tablet Take 50 mg by mouth every 6 (six) hours as needed. Reported on 07/13/2015  . [DISCONTINUED] benzonatate (TESSALON) 200 MG capsule Take 1 capsule (200 mg total) by mouth 2 (two) times daily as needed for cough.  . [DISCONTINUED]  HYDROcodone-homatropine (HYCODAN) 5-1.5 MG/5ML syrup 1-2 tsp po qhs prn cough   No facility-administered encounter medications on file as of 06/26/2016.     Allergies (verified) Patient has no known allergies.   History: Past Medical History:  Diagnosis Date  . BPH with obstruction/lower urinary tract symptoms    +Hx of acute urinary retention/acute cystitis: responding fairly well to tamsulosin and finasteride as of 04/2014  . History of adenomatous polyp of colon    Dr. Oletta Lamas: last colonoscopy was 2017 and was normal.   5 yr recall.  [name] HTN (hypertension)   . Hyperlipidemia   . Nephrolithiasis 2015   CT showed 2cm left sided stone which was stable since 2006 imaging and likely in renal parenchyma  . Obesity, Class II, BMI 35-39.9   . OSTEOMYELITIS, CHRONIC 02/21/2006   Staph infection s/p lumbar surgery (surgery x 3).  Annotation: previous methicillin sensitive  staphylococcus aureus 2006 Qualifier: Diagnosis of  By: Johnnye Sima MD, Dellis Filbert  (ID Specialist)     Past Surgical History:  Procedure Laterality Date  . COLONOSCOPY  10/12/2015   repeat 5 years  . COLONOSCOPY W/ POLYPECTOMY  2007    Negative 2012 and 2017; Dr Ollen Barges 2022.  . LUMBAR LAMINECTOMY     Dr Vertell Limber performed 2nd & 3rd procedures (pt also has hx of fall and vertebral fracture years prior to his surgeries.  . TONSILLECTOMY  Family History  Problem Relation Age of Onset  . Stroke Mother   . Cancer Mother 68    melanoma  . Cancer Father 79    lung cancer; [employer] 2 (nonsmoker)  . Heart disease Maternal Grandmother   . Heart disease Maternal Grandfather   . Heart disease Paternal Grandmother   . Cancer Brother     bladder cancer   Social History   Occupational History  . Not on file.   Social History Main Topics  . Smoking status: Former Smoker    Quit date: 05/22/1973  . Smokeless tobacco: Not on file     Comment: 35 years ago as of 2013  . Alcohol use No  . Drug use: No  . Sexual  activity: Not on file   Tobacco Counseling Counseling given: Not Answered   Activities of Daily Living In your present state of health, do you have any difficulty performing the following activities: 06/26/2016  Hearing? N  Vision? N  Difficulty concentrating or making decisions? N  Walking or climbing stairs? N  Dressing or bathing? N  Doing errands, shopping? N  Preparing Food and eating ? N  Using the Toilet? N  In the past six months, have you accidently leaked urine? N  Do you have problems with loss of bowel control? N  Managing your Medications? N  Managing your Finances? N  Housekeeping or managing your Housekeeping? N  Some recent data might be hidden    Immunizations and Health Maintenance Immunization History  Administered Date(s) Administered  . Influenza Split 02/22/2013  . Influenza Whole 02/21/2006, 03/23/2011  . Influenza,inj,Quad PF,36+ Mos 03/03/2016  . Influenza-Unspecified 03/23/2015  . Pneumococcal Conjugate-13 07/07/2014  . Pneumococcal Polysaccharide-23 06/26/2016  . Td 05/22/2004  . Tdap 07/07/2014   There are no preventive care reminders to display for this patient.  Patient Care Team: [name], MD as PCP - General (Family Medicine) Festus Aloe, MD as Consulting Physician (Urology) Lyndal Pulley, DO as Consulting Physician (Sports Medicine) Laurence Spates, MD as Consulting Physician (Gastroenterology) Odette Fraction [facility])  Indicate any recent Medical Services you may have received from other than [facility] providers in the past year (date may be approximate).    Assessment:   This is a routine wellness examination for Derrick [name]. Physical assessment deferred to PCP.   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 Can hear low sounds. Declines hearing aid.   Dietary issues and exercise activities discussed: Current Exercise Habits: Home exercise routine, Type of exercise: treadmill;strength  training/weights;walking;Other - see comments Oceanographer), Time (Minutes): 30, Frequency (Times/Week): 3, Weekly Exercise (Minutes/Week): 90, Exercise limited by: orthopedic condition(s) (back pain)   Diet (meal preparation, eat out, water intake, caffeinated beverages, dairy products, fruits and vegetables): At home mostly. Drinks water (4-5 bottles).   Breakfast: Celery with pimento cheese. Coffee (2 cups) Lunch: sandwich, soup Dinner:  Meat, vegetables.   Discussed heart healthy diet, portion control and increasing water.  Encouraged to continue current activity level.   Goals    . Weight (lb) < 215 lb (97.5 kg)          Would like to lose 30 pounds by maintaining current activity and portion control.       Depression Screen PHQ 2/9 Scores 06/26/2016 01/06/2015  PHQ - 2 Score 0 0    Fall Risk Fall Risk  06/26/2016 01/06/2015  Falls in the past year? No No    Cognitive Function:  Ad8 score reviewed for issues:  Issues making decisions:no  Less interest in hobbies / activities: no  Repeats questions, stories (family complaining):no  Trouble using ordinary gadgets (microwave, computer, phone):no  Forgets the month or year: no  Mismanaging finances: no  Remembering appts:no  Daily problems with thinking and/or memory:no Ad8 score is=0     Screening Tests Health Maintenance  Topic Date Due  . Hepatitis C Screening  06/26/2017 (Originally 08/20/1950)  . TETANUS/TDAP  07/07/2024  . COLONOSCOPY  10/11/2025  . INFLUENZA VACCINE  Completed  . ZOSTAVAX  Addressed        Plan:     Continue to eat heart healthy diet (full of fruits, vegetables, whole grains, lean protein, water--limit salt, fat, and sugar intake) and increase physical activity as tolerated.  Continue doing brain stimulating activities (puzzles, reading, adult coloring books, staying active) to keep memory sharp.   Bring a copy of your advance directives to your next office visit.  Make  appointment with Urology.     During the course of the visit Bishop was educated and counseled about the following appropriate screening and preventive services:   Vaccines to include Pneumoccal, Influenza, Hepatitis B, Td, Zostavax, HCV  Colorectal cancer screening  Cardiovascular disease screening  Diabetes screening  Glaucoma screening  Nutrition counseling  Prostate cancer screening   Patient Instructions (the written plan) were given to the patient.   Gerilyn Nestle, RN   06/26/2016

## 2016-06-26 NOTE — Progress Notes (Signed)
Pre visit review using our clinic review tool, if applicable. No additional management support is needed unless otherwise documented below in the visit note. 

## 2016-06-26 NOTE — Progress Notes (Signed)
Reviewed AWV and agree.  Signed:  Crissie Sickles, MD           06/26/2016

## 2016-06-27 ENCOUNTER — Other Ambulatory Visit: Payer: Self-pay | Admitting: *Deleted

## 2016-06-27 DIAGNOSIS — E782 Mixed hyperlipidemia: Secondary | ICD-10-CM

## 2016-06-27 MED ORDER — ATORVASTATIN CALCIUM 20 MG PO TABS: 20.0000 mg | ORAL_TABLET | Freq: Every day | ORAL | 2 refills | Status: DC

## 2016-07-21 ENCOUNTER — Other Ambulatory Visit: Payer: Self-pay | Admitting: Family Medicine

## 2016-07-29 ENCOUNTER — Other Ambulatory Visit: Payer: Self-pay | Admitting: Family Medicine

## 2016-08-28 ENCOUNTER — Other Ambulatory Visit (INDEPENDENT_AMBULATORY_CARE_PROVIDER_SITE_OTHER): Payer: Medicare Other

## 2016-08-28 DIAGNOSIS — E782 Mixed hyperlipidemia: Secondary | ICD-10-CM

## 2016-08-28 LAB — LIPID PANEL
CHOLESTEROL: 130 mg/dL (ref 0–200)
HDL: 45.2 mg/dL (ref >39.00)
LDL Cholesterol: 65 mg/dL (ref 0–99)
NONHDL: 85.06
Total CHOL/HDL Ratio: 3
Triglycerides: 99 mg/dL (ref 0.0–149.0)
VLDL: 19.8 mg/dL (ref 0.0–40.0)

## 2016-09-22 ENCOUNTER — Other Ambulatory Visit: Payer: Self-pay | Admitting: Family Medicine

## 2016-09-22 NOTE — Telephone Encounter (Signed)
[facility] [place]  RF request for atrovastatin LOV: 06/26/16 Next ov: 07/02/17 Last written: 06/27/16 #30 w/ 2RF

## 2016-09-26 DIAGNOSIS — N401 Enlarged prostate with lower urinary tract symptoms: Secondary | ICD-10-CM | POA: Diagnosis not present

## 2016-10-02 DIAGNOSIS — N400 Enlarged prostate without lower urinary tract symptoms: Secondary | ICD-10-CM | POA: Diagnosis not present

## 2016-10-04 ENCOUNTER — Encounter: Payer: Self-pay | Admitting: Family Medicine

## 2016-10-19 ENCOUNTER — Other Ambulatory Visit: Payer: Self-pay | Admitting: Family Medicine

## 2016-10-28 ENCOUNTER — Other Ambulatory Visit: Payer: Self-pay | Admitting: Family Medicine

## 2016-10-30 NOTE — Telephone Encounter (Signed)
[facility] [place]  RF request for tamsulosin LOV: 06/26/16 Next ov: 07/02/17 Last written: 07/31/16 #90 w/ 0RF  RF request for metoprolol Last written: 06/21/15 #90 w/ 3RF

## 2016-11-04 ENCOUNTER — Other Ambulatory Visit: Payer: Self-pay | Admitting: Family Medicine

## 2016-12-18 ENCOUNTER — Other Ambulatory Visit: Payer: Self-pay | Admitting: Family Medicine

## 2017-01-17 ENCOUNTER — Other Ambulatory Visit: Payer: Self-pay | Admitting: Family Medicine

## 2017-01-17 NOTE — Telephone Encounter (Signed)
Rx sent to [place]

## 2017-02-02 ENCOUNTER — Other Ambulatory Visit: Payer: Self-pay | Admitting: Family Medicine

## 2017-03-12 ENCOUNTER — Ambulatory Visit (INDEPENDENT_AMBULATORY_CARE_PROVIDER_SITE_OTHER): Payer: Medicare Other

## 2017-03-12 DIAGNOSIS — Z23 Encounter for immunization: Secondary | ICD-10-CM | POA: Diagnosis not present

## 2017-03-17 ENCOUNTER — Other Ambulatory Visit: Payer: Self-pay | Admitting: Family Medicine

## 2017-03-19 ENCOUNTER — Encounter: Payer: Self-pay | Admitting: Family Medicine

## 2017-04-28 ENCOUNTER — Other Ambulatory Visit: Payer: Self-pay | Admitting: Family Medicine

## 2017-05-09 ENCOUNTER — Other Ambulatory Visit: Payer: Self-pay | Admitting: Family Medicine

## 2017-06-05 ENCOUNTER — Emergency Department (EMERGENCY_DEPARTMENT_HOSPITAL): Admit: 2017-06-05 | Discharge: 2017-06-05 | Disposition: A | Discharge status: Home / Self Care | Payer: Medicare Other

## 2017-06-05 ENCOUNTER — Emergency Department (HOSPITAL_COMMUNITY): Payer: Medicare Other

## 2017-06-05 ENCOUNTER — Emergency Department (HOSPITAL_COMMUNITY)
Admission: EM | Admit: 2017-06-05 | Discharge: 2017-06-05 | Disposition: A | Discharge status: Home / Self Care | Payer: Medicare Other | Attending: Emergency Medicine | Admitting: Emergency Medicine

## 2017-06-05 ENCOUNTER — Encounter (HOSPITAL_COMMUNITY): Payer: Self-pay | Admitting: Emergency Medicine

## 2017-06-05 ENCOUNTER — Telehealth: Payer: Self-pay

## 2017-06-05 DIAGNOSIS — R609 Edema, unspecified: Secondary | ICD-10-CM | POA: Diagnosis not present

## 2017-06-05 DIAGNOSIS — Z7982 Long term (current) use of aspirin: Secondary | ICD-10-CM | POA: Diagnosis not present

## 2017-06-05 DIAGNOSIS — I10 Essential (primary) hypertension: Secondary | ICD-10-CM | POA: Insufficient documentation

## 2017-06-05 DIAGNOSIS — H538 Other visual disturbances: Secondary | ICD-10-CM | POA: Diagnosis not present

## 2017-06-05 DIAGNOSIS — Z87891 Personal history of nicotine dependence: Secondary | ICD-10-CM | POA: Diagnosis not present

## 2017-06-05 DIAGNOSIS — Z79899 Other long term (current) drug therapy: Secondary | ICD-10-CM | POA: Insufficient documentation

## 2017-06-05 DIAGNOSIS — N3001 Acute cystitis with hematuria: Secondary | ICD-10-CM

## 2017-06-05 LAB — BASIC METABOLIC PANEL
Anion gap: 6 (ref 5–15)
BUN: 19 mg/dL (ref 6–20)
CHLORIDE: 109 mmol/L (ref 101–111)
CO2: 24 mmol/L (ref 22–32)
CREATININE: 0.9 mg/dL (ref 0.61–1.24)
Calcium: 8.6 mg/dL — ABNORMAL LOW (ref 8.9–10.3)
GFR calc Af Amer: >60 mL/min (ref >60)
GFR calc non Af Amer: >60 mL/min (ref >60)
Glucose, Bld: 103 mg/dL — ABNORMAL HIGH (ref 65–99)
POTASSIUM: 4.4 mmol/L (ref 3.5–5.1)
Sodium: 139 mmol/L (ref 135–145)

## 2017-06-05 LAB — CBC WITH DIFFERENTIAL/PLATELET
BASOS ABS: 0 10*3/uL (ref 0.0–0.1)
Basophils Relative: 1 %
EOS ABS: 0.2 10*3/uL (ref 0.0–0.7)
Eosinophils Relative: 2 %
HEMATOCRIT: 48.8 % (ref 39.0–52.0)
Hemoglobin: 17.4 g/dL — ABNORMAL HIGH (ref 13.0–17.0)
LYMPHS PCT: 24 %
Lymphs Abs: 2.1 10*3/uL (ref 0.7–4.0)
MCH: 31.3 pg (ref 26.0–34.0)
MCHC: 35.7 g/dL (ref 30.0–36.0)
MCV: 87.8 fL (ref 78.0–100.0)
MONOS PCT: 10 %
Monocytes Absolute: 0.8 10*3/uL (ref 0.1–1.0)
NEUTROS PCT: 63 %
Neutro Abs: 5.5 10*3/uL (ref 1.7–7.7)
PLATELETS: 161 10*3/uL (ref 150–400)
RBC: 5.56 MIL/uL (ref 4.22–5.81)
RDW: 14.3 % (ref 11.5–15.5)
WBC: 8.6 10*3/uL (ref 4.0–10.5)

## 2017-06-05 LAB — URINALYSIS, ROUTINE W REFLEX MICROSCOPIC
BILIRUBIN URINE: NEGATIVE
Glucose, UA: NEGATIVE mg/dL
Ketones, ur: NEGATIVE mg/dL
NITRITE: NEGATIVE
PH: 5 (ref 5.0–8.0)
Protein, ur: NEGATIVE mg/dL
SPECIFIC GRAVITY, URINE: 1.018 (ref 1.005–1.030)

## 2017-06-05 MED ORDER — CEPHALEXIN 500 MG PO CAPS: 500.0000 mg | ORAL_CAPSULE | Freq: Four times a day (QID) | ORAL | 0 refills | Status: DC

## 2017-06-05 MED ORDER — CEPHALEXIN 500 MG PO CAPS: 500.0000 mg | ORAL_CAPSULE | Freq: Once | ORAL | Status: DC

## 2017-06-05 NOTE — ED Triage Notes (Signed)
Patient presents with blurred vision onset of a week ago. Pt saw a PA and had neuro exam but was sent for further evaluation. Pt adds leg swelling for past year. Neuro in tact.

## 2017-06-05 NOTE — ED Notes (Signed)
Pt and wife saw RN in hall and stated they had to leave and someone else had already given them the paper prescription for the UTI.  PT alert and ambulatory in hallway. Unable to obtain signature or discharge vitals.

## 2017-06-05 NOTE — ED Provider Notes (Signed)
Patient placed in Quick Look pathway, seen and evaluated   Chief Complaint: blurry vision x 1 week  HPI:   Blurry vision began one week ago. States even having blurry vision when reading with his glasses on. Went to PCP office today and blood work was done, physical exam was done. Due to symptoms, sent here for EKG, bloodwork, carotid artery assessment. Leg swelling bilaterally x 1 year. Reports L>R blurry vision but "97% of the time, I'm seeing normally."  ROS: blurry vision  Physical Exam:   Gen: No distress  Neuro: Awake and Alert  Skin: Warm    Focused Exam: lungs CTAB, RRR; no deficits on neurological exam, cranial nerves appear grossly intact; alert and oriented x4. Appears overall well. Ambulatory with normal gait. Bilateral, symmetrical edema in lower extremities, without erythema, calf tenderness or warmth.   Initiation of care has begun. The patient has been counseled on the process, plan, and necessity for staying for the completion/evaluation, and the remainder of the medical screening examination    Delia Heady, PA-C 06/05/17 1236    Virgel Manifold, MD 06/05/17 [phone]

## 2017-06-05 NOTE — ED Notes (Addendum)
Pt and wife frustrated about the delay and asking if they could just "come back tomorrow" RN attempted to assure them that it should not be much longer and CT stated there was only one more pt ahead of him.  Pt's wife very frustrated and stated "We only came in for one thing. We should not have to be here this long." RN attempted to explain that there are many people requiring a CT scan today and it is normally a long out patient process that takes a few days to get the results, that we are trying to fit into a few hours of care.  Pt's wife walked away from conversation still seemingly unhappy.

## 2017-06-05 NOTE — ED Notes (Signed)
Pt requesting to speak to MD.  MD made aware

## 2017-06-05 NOTE — Telephone Encounter (Signed)
Noted agree

## 2017-06-05 NOTE — ED Notes (Signed)
MD at bedside. 

## 2017-06-05 NOTE — ED Notes (Signed)
Pt updated on CT scan delay. RN was told pt would be gotten for scan within the next half hour.

## 2017-06-05 NOTE — Telephone Encounter (Signed)
Patient walked into office with c/o "feeling a little off, maybe medication, chemical imbalance?, and having a little trouble filling this out". Dr. Anitra Lauth notified and instructed the patient to go to ED.  Paitent was made aware and stated that he would go to ED as instructed.

## 2017-06-05 NOTE — ED Provider Notes (Signed)
[facility] DEPT Provider Note   CSN: [id number] Arrival date & time: 06/05/17  1208     History   Chief Complaint Chief Complaint  Patient presents with  . Blurred Vision    HPI Derrick [name] is a 67 y.o. male.  Pt presents to the ED today with blurred vision.  The pt said it is intermittent.  Pt went to urgent care and was told to come here.  The pt denies any other neurologic abnormalities.      Past Medical History:  Diagnosis Date  . BPH with obstruction/lower urinary tract symptoms    +Hx of acute urinary retention/acute cystitis: responding fairly well to tamsulosin and finasteride as of 09/2016 urol f/u.  [name] History of adenomatous polyp of colon    Dr. Oletta Lamas: last colonoscopy was 2017 and was normal.   5 yr recall.  [name] HTN (hypertension)   . Hyperlipidemia    Recommended statin 06/2016  . Low back pain   . Nephrolithiasis 2015   CT showed 2cm left sided stone which was stable since 2006 imaging and likely in renal parenchyma  . Obesity, Class II, BMI 35-39.9   . OSTEOMYELITIS, CHRONIC 02/21/2006   Staph infection s/p lumbar surgery (surgery x 3).  Annotation: previous methicillin sensitive  staphylococcus aureus 2006 Qualifier: Diagnosis of  By: Johnnye Sima MD, Dellis Filbert  (ID Specialist)      Patient Active Problem List   Diagnosis Date Noted  . Closed low lateral malleolus fracture 05/18/2015  . 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 Ankle arthritis 05/18/2015  . Obesity, Class II, BMI 35-39.9   . Prostate cancer screening 06/13/2014  . 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 Obesity, unspecified 06/27/2012  . Health maintenance examination 06/27/2012  . HTN (hypertension), benign 06/16/2010  . VITAMIN D DEFICIENCY 03/30/2009  . Hyperlipidemia 03/30/2009  . BENIGN PROSTATIC HYPERTROPHY 03/30/2009  . COLONIC POLYPS, HX OF 03/30/2009  . INFECTION DUE TO INTERNAL ORTH DEVICE NEC 05/11/2005  . LAMINECTOMY, LUMBAR, HX OF 04/07/2005     Past Surgical History:  Procedure Laterality Date  . COLONOSCOPY  10/12/2015   repeat 5 years  . COLONOSCOPY W/ POLYPECTOMY  2007    Negative 2012 and 2017; Dr Ollen Barges 2022.  . LUMBAR LAMINECTOMY     Dr Vertell Limber performed 2nd & 3rd procedures (pt also has hx of fall and vertebral fracture years prior to his surgeries.  . TONSILLECTOMY         Home Medications    Prior to Admission medications   Medication Sig Start Date End Date Taking? Authorizing Provider  aspirin 81 MG tablet Take 160 mg by mouth daily.   Yes [provider]  atorvastatin (LIPITOR) 20 MG tablet TAKE 1 TABLET BY MOUTH DAILY Patient taking differently: TAKE 20 mg BY MOUTH DAILY 09/22/16  Yes McGowen, Adrian Blackwater, MD  finasteride (PROSCAR) 5 MG tablet TAKE 1 TABLET BY MOUTH DAILY Patient taking differently: TAKE 5 mg BY MOUTH DAILY 01/17/17  Yes McGowen, Adrian Blackwater, MD  losartan (COZAAR) 50 MG tablet TAKE 1 TABLET BY MOUTH DAILY Patient taking differently: TAKE 50 mg BY MOUTH DAILY 05/09/17  Yes McGowen, Adrian Blackwater, MD  meloxicam (MOBIC) 15 MG tablet TAKE 1 TABLET BY MOUTH DAILY AS NEEDED FOR MUSCULOSKELETAL PAIN AS DIRECTED Patient taking differently: TAKE 15 mg BY MOUTH once DAILY AS NEEDED FOR MUSCULOSKELETAL PAIN AS DIRECTED 03/19/17  Yes McGowen, Adrian Blackwater, MD  metoprolol succinate (TOPROL-XL) 25 MG 24 hr tablet  TAKE 1 TABLET BY MOUTH DAILY Patient taking differently: TAKE 25 mg BY MOUTH DAILY 05/02/17  Yes McGowen, Adrian Blackwater, MD  tamsulosin (FLOMAX) 0.4 MG CAPS capsule TAKE 1 CAPSULE BY MOUTH EVERY DAY Patient taking differently: TAKE 0.4 mg BY MOUTH EVERY DAY 05/02/17  Yes McGowen, Adrian Blackwater, MD  cephALEXin (KEFLEX) 500 MG capsule Take 1 capsule (500 mg total) by mouth 4 (four) times daily. 06/05/17   Isla Pence, MD    Family History Family History  Problem Relation Age of Onset  . Stroke Mother   . Cancer Mother 59       melanoma  . Cancer Father 60       lung cancer; [employer] 2  (nonsmoker)  . Heart disease Maternal Grandmother   . Heart disease Maternal Grandfather   . Heart disease Paternal Grandmother   . Cancer Brother        bladder cancer    Social History Social History   Tobacco Use  . Smoking status: Former Smoker    Last attempt to quit: 05/22/1973    Years since quitting: 44.0  . Smokeless tobacco: Never Used  . Tobacco comment: 35 years ago as of 2013  Substance Use Topics  . Alcohol use: No  . Drug use: No     Allergies   Patient has no known allergies.   Review of Systems Review of Systems  Eyes: Positive for visual disturbance.  All other systems reviewed and are negative.    Physical Exam Updated Vital Signs BP (!) 148/98 (BP Location: Right Arm)   Pulse 88   Temp (!) 97.5 F (36.4 C) (Oral)   Resp 14   Wt 116.6 kg (257 lb)   SpO2 98%   BMI 39.08 kg/m   Physical Exam  Constitutional: He is oriented to person, place, and time. He appears well-developed and well-nourished.  HENT:  Head: Normocephalic and atraumatic.  Right Ear: External ear normal.  Left Ear: External ear normal.  Nose: Nose normal.  Mouth/Throat: Oropharynx is clear and moist.  Eyes: Conjunctivae and EOM are normal. Pupils are equal, round, and reactive to light.  Neck: Normal range of motion. Neck supple.  Cardiovascular: Normal rate, regular rhythm, normal heart sounds and intact distal pulses.  Pulmonary/Chest: Effort normal and breath sounds normal.  Abdominal: Soft. Bowel sounds are normal.  Musculoskeletal: Normal range of motion.  Neurological: He is alert and oriented to person, place, and time.  Skin: Skin is warm and dry. Capillary refill takes less than 2 seconds.  Psychiatric: He has a normal mood and affect. His behavior is normal. Judgment and thought content normal.  Nursing note and vitals reviewed.    ED Treatments / Results  Labs (all labs ordered are listed, but only abnormal results are displayed) Labs Reviewed  BASIC  METABOLIC PANEL - Abnormal; Notable for the following components:      Result Value   Glucose, Bld 103 (*)    Calcium 8.6 (*)    All other components within normal limits  CBC WITH DIFFERENTIAL/PLATELET - Abnormal; Notable for the following components:   Hemoglobin 17.4 (*)    All other components within normal limits  URINALYSIS, ROUTINE W REFLEX MICROSCOPIC - Abnormal; Notable for the following components:   APPearance HAZY (*)    Hgb urine dipstick MODERATE (*)    Leukocytes, UA LARGE (*)    Bacteria, UA RARE (*)    Squamous Epithelial / LPF 0-5 (*)    All other  components within normal limits  URINE CULTURE    EKG  EKG Interpretation  Date/Time:  Tuesday June 05 2017 12:43:36 EST Ventricular Rate:  87 PR Interval:    QRS Duration: 100 QT Interval:  375 QTC Calculation: 452 R Axis:   -60 Text Interpretation:  Sinus rhythm Atrial premature complex Inferior infarct, old Confirmed by Isla Pence [phone]) on 06/05/2017 3:01:36 PM       Radiology Ct Head Wo Contrast  Result Date: 06/05/2017 CLINICAL DATA:  Visual loss or uveitis/scleritis, Blurry vision began one week ago. EXAM: CT HEAD WITHOUT CONTRAST TECHNIQUE: Contiguous axial images were obtained from the base of the skull through the vertex without intravenous contrast. COMPARISON:  None. FINDINGS: Brain: Mild generalized age related parenchymal atrophy with commensurate dilatation of the ventricles and sulci. Chronic small vessel ischemic changes within the bilateral periventricular and subcortical white matter regions. No mass, hemorrhage, edema or other evidence of acute parenchymal abnormality. No extra-axial hemorrhage. Vascular: There are chronic calcified atherosclerotic changes of the large vessels at the skull base. No unexpected hyperdense vessel. Skull: Normal. Negative for fracture or focal lesion. Sinuses/Orbits: Mucosal thickening within the ethmoid air cells, likely chronic. Periorbital and retro-orbital  soft tissues are unremarkable. Other: None. IMPRESSION: 1. No acute findings.  No intracranial mass, hemorrhage or edema. 2. 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 Electronically Signed   By: Franki Cabot M.D.   On: 06/05/2017 17:43    Procedures Procedures (including critical care time)  Medications Ordered in ED Medications  cephALEXin (KEFLEX) capsule 500 mg (not administered)     Initial Impression / Assessment and Plan / ED Course  I have reviewed the triage vital signs and the nursing notes.  Pertinent labs & imaging results that were available during my care of the patient were reviewed by me and considered in my medical decision making (see chart for details).    *Preliminary Results* Carotid artery duplex has been completed. Bilateral internal carotid arteries are near-normal with only minimal wall thickening or plaque. Vertebral arteries are patent with antegrade flow.  Sx are likely from UTI which he's had before due to his BPH.  Pt did stay for CT, but wanted to go quickly as he's had a long wait.  I went over d/c papers with pt.  He understands and knows to f/u with pcp, urologist, and with his eye doctor.  He knows to return if worse.  Final Clinical Impressions(s) / ED Diagnoses   Final diagnoses:  Blurred vision  Acute cystitis with hematuria    ED Discharge Orders        Ordered    cephALEXin (KEFLEX) 500 MG capsule  4 times daily     06/05/17 1733       Isla Pence, MD 06/05/17 1751

## 2017-06-05 NOTE — Progress Notes (Signed)
*  Preliminary Results* Carotid artery duplex has been completed. Bilateral internal carotid arteries are near-normal with only minimal wall thickening or plaque. Vertebral arteries are patent with antegrade flow.  06/05/2017 1:44 PM  Maudry Mayhew, BS, RVT, RDCS, RDMS

## 2017-06-07 LAB — URINE CULTURE: Culture: NO GROWTH

## 2017-07-02 ENCOUNTER — Ambulatory Visit: Payer: Medicare Other

## 2017-07-05 ENCOUNTER — Telehealth: Payer: Self-pay

## 2017-07-05 NOTE — Telephone Encounter (Signed)
Copied from [place] [id number]. Topic: General - Call Back - No Documentation >> Jul 05, 2017 12:01 PM Derrick [name] wrote: Reason for CRM: Patient missed a call from office. If it was about AWV he is not interested in it at all and would like to not get any calls regarding it.

## 2017-07-05 NOTE — Telephone Encounter (Signed)
Patient declines AWV, see CRM note.

## 2017-07-23 DIAGNOSIS — N411 Chronic prostatitis: Secondary | ICD-10-CM | POA: Diagnosis not present

## 2017-07-23 DIAGNOSIS — N403 Nodular prostate with lower urinary tract symptoms: Secondary | ICD-10-CM | POA: Diagnosis not present

## 2017-08-01 ENCOUNTER — Other Ambulatory Visit: Payer: Self-pay | Admitting: Family Medicine

## 2017-08-07 ENCOUNTER — Other Ambulatory Visit: Payer: Self-pay | Admitting: Family Medicine

## 2017-08-09 ENCOUNTER — Other Ambulatory Visit: Payer: Self-pay | Admitting: Family Medicine

## 2017-08-09 ENCOUNTER — Other Ambulatory Visit: Payer: Self-pay | Admitting: *Deleted

## 2017-08-09 ENCOUNTER — Telehealth: Payer: Self-pay | Admitting: Family Medicine

## 2017-08-09 MED ORDER — LOSARTAN POTASSIUM 50 MG PO TABS: 50.0000 mg | ORAL_TABLET | Freq: Every day | ORAL | 0 refills | Status: DC

## 2017-08-09 NOTE — Telephone Encounter (Signed)
Copied from [place]. Topic: Quick Communication - See Telephone Encounter >> Aug 09, 2017 11:18 AM Conception Chancy, NT wrote: CRM for notification. See Telephone encounter for: 08/09/17.  Patient is calling and requesting a refill on losartan (COZAAR) 50 MG tablet  Please advise.  [facility], [address]  [place] [address]  Phone: [phone] Fax: [phone]

## 2017-08-09 NOTE — Telephone Encounter (Signed)
Looks like this medication was sent in by [facility] nurse. Pt was scheduled for f/u in August 2019 (this apt was scheduled to far out, I believe it was done by error since pt was to f/u after last visit in August 2018). I called pt and advised him that we will need to see him soon since it had been over a year since we seen him, he the stated "well that's not my fault". Pt did agree to schedule a sooner apt. New apt made for 08/13/17 at 1:00pm. Rx for 90 day supply was declined.

## 2017-08-13 ENCOUNTER — Ambulatory Visit: Payer: Medicare Other | Admitting: Family Medicine

## 2017-08-15 ENCOUNTER — Encounter: Payer: Self-pay | Admitting: Family Medicine

## 2017-08-15 ENCOUNTER — Ambulatory Visit (INDEPENDENT_AMBULATORY_CARE_PROVIDER_SITE_OTHER): Payer: Medicare Other | Admitting: Family Medicine

## 2017-08-15 VITALS — BP 131/78 | HR 80 | Temp 98.2°F | Resp 16 | Ht 68.0 in | Wt 253.2 lb

## 2017-08-15 DIAGNOSIS — E7800 Pure hypercholesterolemia, unspecified: Secondary | ICD-10-CM | POA: Diagnosis not present

## 2017-08-15 DIAGNOSIS — I10 Essential (primary) hypertension: Secondary | ICD-10-CM

## 2017-08-15 DIAGNOSIS — R6882 Decreased libido: Secondary | ICD-10-CM

## 2017-08-15 MED ORDER — LOSARTAN POTASSIUM 50 MG PO TABS: 50.0000 mg | ORAL_TABLET | Freq: Every day | ORAL | 1 refills | Status: DC

## 2017-08-15 NOTE — Progress Notes (Signed)
OFFICE VISIT  08/17/2017   CC:  Chief Complaint  Patient presents with  . Follow-up    RCI, pt is not fasting.    HPI:    Patient is a 67 y.o. Caucasian male who presents for f/u HTN and hypercholesterolemia. Occ he gets a sharp,focal pain in middle of sternum.  Occurs any time (at rest as often as when moving). Lying on chest doesn't elicit pain.  No tenderness to touch. No radiation.  No diaphoresis, palpitations, SOB, or nausea associated with this pain.  No exertional CP.  Has chronic R lower and R mid back pain, has had 3 surgeries.  Some R leg radicular pain intermittently. He is still active, goes to the [place] to swim 2-3 times per week.  HLD: stopped atorvastatin.  It did not cause him any side effects.  He had forgotten to refill this. HTN: home checks with upper arm cuff consistently < 130/80.  Went to ED a couple months ago and was dx'd with UTI.  No urinary sx's lately.  CT head w/out contrast 06/05/17 (in the ED for blurred vision): IMPRESSION: 1. No acute findings.  No intracranial mass, hemorrhage or edema.  2. 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  Six- twelve month hx of decreased libido.  No fatigue.  Question of some erectile problems--pt unable to be specific about this--was embarrassed.  Denies depression.  No recent new meds.  Past Medical History:  Diagnosis Date  . BPH with obstruction/lower urinary tract symptoms    +Hx of acute urinary retention/acute cystitis: responding fairly well to tamsulosin and finasteride as of 09/2016 urol f/u.  [name] History of adenomatous polyp of colon    Dr. Oletta Lamas: last colonoscopy was 2017 and was normal.   5 yr recall.  [name] HTN (hypertension)   . Hyperlipidemia   . Low back pain   . Nephrolithiasis 2015   CT showed 2cm left sided stone which was stable since 2006 imaging and likely in renal parenchyma  . Obesity, Class II, BMI 35-39.9   . OSTEOMYELITIS, CHRONIC 02/21/2006   Staph infection s/p  lumbar surgery (surgery x 3).  Annotation: previous methicillin sensitive  staphylococcus aureus 2006 Qualifier: Diagnosis of  By: Johnnye Sima MD, Dellis Filbert  (ID Specialist)      Past Surgical History:  Procedure Laterality Date  . COLONOSCOPY  10/12/2015   repeat 5 years  . COLONOSCOPY W/ POLYPECTOMY  2007    Negative 2012 and 2017; Dr Ollen Barges 2022.  . LUMBAR LAMINECTOMY     Dr Vertell Limber performed 2nd & 3rd procedures (pt also has hx of fall and vertebral fracture years prior to his surgeries.  . TONSILLECTOMY      Outpatient Medications Prior to Visit  Medication Sig Dispense Refill  . aspirin 81 MG tablet Take 160 mg by mouth daily.    . finasteride (PROSCAR) 5 MG tablet TAKE 1 TABLET BY MOUTH DAILY 90 tablet 0  . meloxicam (MOBIC) 15 MG tablet TAKE 1 TABLET BY MOUTH DAILY AS NEEDED FOR MUSCULOSKELETAL PAIN AS DIRECTED (Patient taking differently: TAKE 15 mg BY MOUTH once DAILY AS NEEDED FOR MUSCULOSKELETAL PAIN AS DIRECTED) 90 tablet 1  . metoprolol succinate (TOPROL-XL) 25 MG 24 hr tablet TAKE 1 TABLET BY MOUTH DAILY 90 tablet 1  . tamsulosin (FLOMAX) 0.4 MG CAPS capsule TAKE 1 CAPSULE BY MOUTH EVERY DAY 90 capsule 1  . losartan (COZAAR) 50 MG tablet Take 1 tablet (50 mg total) by mouth daily. 30 tablet 0  .  atorvastatin (LIPITOR) 20 MG tablet TAKE 1 TABLET BY MOUTH DAILY (Patient not taking: Reported on 08/15/2017) 90 tablet 3  . cephALEXin (KEFLEX) 500 MG capsule Take 1 capsule (500 mg total) by mouth 4 (four) times daily. (Patient not taking: Reported on 08/15/2017) 28 capsule 0   No facility-administered medications prior to visit.     No Known Allergies  ROS As per HPI  PE: Blood pressure 131/78, pulse 80, temperature 98.2 F (36.8 C), temperature source Oral, resp. rate 16, height 5\' 8"  (1.727 m), weight 253 lb 4 oz (114.9 kg), SpO2 97 %. Gen: Alert, well appearing.  Patient is oriented to person, place, time, and situation. AFFECT: pleasant, lucid thought and speech. CV:  RRR, no m/r/g.   LUNGS: CTA bilat, nonlabored resps, good aeration in all lung fields. EXT; no clubbing or cyanosis.  1-2 + pitting edema bilat (L a bit worse than R)  LABS:  Lab Results  Component Value Date   TSH 1.40 07/05/2015   Lab Results  Component Value Date   WBC 8.6 06/05/2017   HGB 17.4 (H) 06/05/2017   HCT 48.8 06/05/2017   MCV 87.8 06/05/2017   PLT 161 06/05/2017   Lab Results  Component Value Date   CREATININE 1.17 08/15/2017   BUN 20 08/15/2017   NA 140 08/15/2017   K 4.1 08/15/2017   CL 104 08/15/2017   CO2 27 08/15/2017   Lab Results  Component Value Date   ALT 16 06/26/2016   AST 14 06/26/2016   ALKPHOS 65 06/26/2016   BILITOT 0.6 06/26/2016   Lab Results  Component Value Date   CHOL 130 08/28/2016   Lab Results  Component Value Date   HDL 45.20 08/28/2016   Lab Results  Component Value Date   LDLCALC 65 08/28/2016   Lab Results  Component Value Date   TRIG 99.0 08/28/2016   Lab Results  Component Value Date   CHOLHDL 3 08/28/2016   Lab Results  Component Value Date   PSA 1.95 06/21/2011   PSA 1.91 06/21/2010   PSA 1.53 06/25/2008    IMPRESSION AND PLAN:  1) HTN: The current medical regimen is effective;  continue present plan and medications. Lytes/cr today.  2) HLD: off meds for a while b/c didn't fill after running out. He is ok with getting back on this med: atorva 20mg  qd rx'd today.  3) Decreased libido: check testosterone level.  Spent 25 min with pt today, with >50% of this time spent in counseling and care coordination regarding the above problems.  An After Visit Summary was printed and given to the patient.  FOLLOW UP: Return in about 6 months (around 02/15/2018) for routine chronic illness f/u.  Signed:  Crissie Sickles, MD           08/17/2017

## 2017-08-16 LAB — BASIC METABOLIC PANEL
BUN: 20 mg/dL (ref 6–23)
CO2: 27 mEq/L (ref 19–32)
Calcium: 9.2 mg/dL (ref 8.4–10.5)
Chloride: 104 mEq/L (ref 96–112)
Creatinine, Ser: 1.17 mg/dL (ref 0.40–1.50)
GFR: 65.73 mL/min (ref >60.00)
GLUCOSE: 109 mg/dL — AB (ref 70–99)
POTASSIUM: 4.1 meq/L (ref 3.5–5.1)
SODIUM: 140 meq/L (ref 135–145)

## 2017-08-16 LAB — TESTOSTERONE TOTAL,FREE,BIO, MALES
Albumin: 4.4 g/dL (ref 3.6–5.1)
SEX HORMONE BINDING: 34 nmol/L (ref 22–77)
TESTOSTERONE BIOAVAILABLE: 99.4 ng/dL — AB (ref >=110.0)
TESTOSTERONE FREE: 49.4 pg/mL (ref 46.0–224.0)
TESTOSTERONE: 386 ng/dL (ref 250–827)

## 2017-09-03 DIAGNOSIS — N200 Calculus of kidney: Secondary | ICD-10-CM | POA: Diagnosis not present

## 2017-09-03 DIAGNOSIS — R3912 Poor urinary stream: Secondary | ICD-10-CM | POA: Diagnosis not present

## 2017-09-03 DIAGNOSIS — N400 Enlarged prostate without lower urinary tract symptoms: Secondary | ICD-10-CM | POA: Diagnosis not present

## 2017-09-03 LAB — LAB REPORT - SCANNED
PSA, Total: 1.39
TESTOSTERONE: 475.1

## 2017-09-17 ENCOUNTER — Other Ambulatory Visit: Payer: Self-pay | Admitting: Family Medicine

## 2017-09-17 NOTE — Telephone Encounter (Signed)
RF request for meloxicam LOV: 08/15/17 Next ov: 02/13/18 Last written: 03/19/17 #90 w/ 1RF  Please advise. Thanks.

## 2017-09-18 ENCOUNTER — Encounter: Payer: Self-pay | Admitting: Family Medicine

## 2017-09-29 ENCOUNTER — Other Ambulatory Visit: Payer: Self-pay | Admitting: Family Medicine

## 2017-11-03 ENCOUNTER — Other Ambulatory Visit: Payer: Self-pay | Admitting: Family Medicine

## 2017-12-12 DIAGNOSIS — N200 Calculus of kidney: Secondary | ICD-10-CM | POA: Diagnosis not present

## 2017-12-12 DIAGNOSIS — R3121 Asymptomatic microscopic hematuria: Secondary | ICD-10-CM | POA: Diagnosis not present

## 2017-12-12 DIAGNOSIS — N400 Enlarged prostate without lower urinary tract symptoms: Secondary | ICD-10-CM | POA: Diagnosis not present

## 2017-12-26 ENCOUNTER — Ambulatory Visit: Payer: Medicare Other | Admitting: Family Medicine

## 2018-01-07 ENCOUNTER — Encounter: Payer: Self-pay | Admitting: Family Medicine

## 2018-01-07 DIAGNOSIS — N200 Calculus of kidney: Secondary | ICD-10-CM | POA: Diagnosis not present

## 2018-01-07 DIAGNOSIS — N400 Enlarged prostate without lower urinary tract symptoms: Secondary | ICD-10-CM | POA: Diagnosis not present

## 2018-01-07 DIAGNOSIS — N3000 Acute cystitis without hematuria: Secondary | ICD-10-CM | POA: Diagnosis not present

## 2018-01-08 ENCOUNTER — Encounter: Payer: Self-pay | Admitting: Family Medicine

## 2018-01-09 ENCOUNTER — Encounter: Payer: Self-pay | Admitting: Family Medicine

## 2018-01-23 DIAGNOSIS — N401 Enlarged prostate with lower urinary tract symptoms: Secondary | ICD-10-CM | POA: Diagnosis not present

## 2018-01-23 DIAGNOSIS — R310 Gross hematuria: Secondary | ICD-10-CM | POA: Diagnosis not present

## 2018-01-23 DIAGNOSIS — R3912 Poor urinary stream: Secondary | ICD-10-CM | POA: Diagnosis not present

## 2018-01-23 DIAGNOSIS — N200 Calculus of kidney: Secondary | ICD-10-CM | POA: Diagnosis not present

## 2018-01-28 ENCOUNTER — Other Ambulatory Visit: Payer: Self-pay | Admitting: Family Medicine

## 2018-01-30 ENCOUNTER — Other Ambulatory Visit: Payer: Self-pay | Admitting: Urology

## 2018-01-30 DIAGNOSIS — R310 Gross hematuria: Secondary | ICD-10-CM | POA: Diagnosis not present

## 2018-01-30 DIAGNOSIS — N200 Calculus of kidney: Secondary | ICD-10-CM | POA: Diagnosis not present

## 2018-02-05 ENCOUNTER — Other Ambulatory Visit: Payer: Self-pay | Admitting: Family Medicine

## 2018-02-11 ENCOUNTER — Encounter (HOSPITAL_COMMUNITY): Payer: Self-pay

## 2018-02-11 NOTE — Patient Instructions (Addendum)
Your procedure is scheduled on: Tuesday, Oct. 1, 2019   Surgery Time:  9:00AM-10:30AM   Report to [facility]  Entrance    Report to admitting at 7:00 AM   Call this number if you have problems the morning of surgery [phone]   Do not eat food or drink liquids :After Midnight.   Brush your teeth the morning of surgery.   Do NOT smoke after Midnight   Take these medicines the morning of surgery with A SIP OF WATER: None                               You may not have any metal on your body including jewelry, and body piercings             Do not wear lotions, powders, perfumes/cologne, or deodorant                          Men may shave face and neck.   Do not bring valuables to the hospital. [place].   Contacts, dentures or bridgework may not be worn into surgery.    Patients discharged the day of surgery will not be allowed to drive home.   Special Instructions: Bring a copy of your healthcare power of attorney and living will documents         the day of surgery if you haven't scanned them in before.              Please read over the following fact sheets you were given:  [facility] - Preparing for Surgery Before surgery, you can play an important role.  Because skin is not sterile, your skin needs to be as free of germs as possible.  You can reduce the number of germs on your skin by washing with CHG (chlorahexidine gluconate) soap before surgery.  CHG is an antiseptic cleaner which kills germs and bonds with the skin to continue killing germs even after washing. Please DO NOT use if you have an allergy to CHG or antibacterial soaps.  If your skin becomes reddened/irritated stop using the CHG and inform your nurse when you arrive at Short Stay. Do not shave (including legs and underarms) for at least 48 hours prior to the first CHG shower.  You may shave your face/neck.  Please follow these instructions  carefully:  1.  Shower with CHG Soap the night before surgery and the  morning of surgery.  2.  If you choose to wash your hair, wash your hair first as usual with your normal  shampoo.  3.  After you shampoo, rinse your hair and body thoroughly to remove the shampoo.                             4.  Use CHG as you would any other liquid soap.  You can apply chg directly to the skin and wash.  Gently with a scrungie or clean washcloth.  5.  Apply the CHG Soap to your body ONLY FROM THE NECK DOWN.   Do   not use on face/ open  Wound or open sores. Avoid contact with eyes, ears mouth and   genitals (private parts).                       Wash face,  Genitals (private parts) with your normal soap.             6.  Wash thoroughly, paying special attention to the area where your    surgery  will be performed.  7.  Thoroughly rinse your body with warm water from the neck down.  8.  DO NOT shower/wash with your normal soap after using and rinsing off the CHG Soap.                9.  Pat yourself dry with a clean towel.            10.  Wear clean pajamas.            11.  Place clean sheets on your bed the night of your first shower and do not  sleep with pets. Day of Surgery : Do not apply any lotions/deodorants the morning of surgery.  Please wear clean clothes to the hospital/surgery center.  FAILURE TO FOLLOW THESE INSTRUCTIONS MAY RESULT IN THE CANCELLATION OF YOUR SURGERY  PATIENT SIGNATURE_________________________________  NURSE SIGNATURE__________________________________  ________________________________________________________________________

## 2018-02-13 ENCOUNTER — Ambulatory Visit (INDEPENDENT_AMBULATORY_CARE_PROVIDER_SITE_OTHER): Payer: Medicare Other | Admitting: Family Medicine

## 2018-02-13 ENCOUNTER — Encounter: Payer: Self-pay | Admitting: Family Medicine

## 2018-02-13 VITALS — BP 134/77 | HR 75 | Temp 97.9°F | Resp 16 | Ht 68.0 in | Wt 255.4 lb

## 2018-02-13 DIAGNOSIS — E7800 Pure hypercholesterolemia, unspecified: Secondary | ICD-10-CM

## 2018-02-13 DIAGNOSIS — Z23 Encounter for immunization: Secondary | ICD-10-CM

## 2018-02-13 DIAGNOSIS — E669 Obesity, unspecified: Secondary | ICD-10-CM | POA: Diagnosis not present

## 2018-02-13 DIAGNOSIS — I10 Essential (primary) hypertension: Secondary | ICD-10-CM

## 2018-02-13 LAB — COMPREHENSIVE METABOLIC PANEL
ALBUMIN: 4.1 g/dL (ref 3.5–5.2)
ALT: 15 U/L (ref 0–53)
AST: 13 U/L (ref 0–37)
Alkaline Phosphatase: 71 U/L (ref 39–117)
BILIRUBIN TOTAL: 0.8 mg/dL (ref 0.2–1.2)
BUN: 20 mg/dL (ref 6–23)
CO2: 26 mEq/L (ref 19–32)
Calcium: 8.7 mg/dL (ref 8.4–10.5)
Chloride: 105 mEq/L (ref 96–112)
Creatinine, Ser: 1 mg/dL (ref 0.40–1.50)
GFR: 78.67 mL/min (ref >60.00)
Glucose, Bld: 99 mg/dL (ref 70–99)
Potassium: 4.8 mEq/L (ref 3.5–5.1)
Sodium: 139 mEq/L (ref 135–145)
TOTAL PROTEIN: 6.9 g/dL (ref 6.0–8.3)

## 2018-02-13 LAB — LIPID PANEL
CHOLESTEROL: 119 mg/dL (ref 0–200)
HDL: 43.4 mg/dL (ref >39.00)
LDL Cholesterol: 51 mg/dL (ref 0–99)
NonHDL: 76.01
Total CHOL/HDL Ratio: 3
Triglycerides: 124 mg/dL (ref 0.0–149.0)
VLDL: 24.8 mg/dL (ref 0.0–40.0)

## 2018-02-13 NOTE — Progress Notes (Signed)
OFFICE VISIT  02/13/2018   CC:  Chief Complaint  Patient presents with  . Follow-up    RCI, pt is fasting.      HPI:    Patient is a 67 y.o. Caucasian male who presents for 6 mo f/u HTN, HLD, gets urolift procedure with urology very soon for his bph.  Swimming a lot lately.  Feeling well.  HTN: no home bp monitoring.  HLD: compliant with statin, no side effects.   Past Medical History:  Diagnosis Date  . BPH with obstruction/lower urinary tract symptoms    +Hx of acute urinary retention/acute cystitis: responding fairly well to tamsulosin and finasteride as of 09/2016 urol f/u.  [name] History of adenomatous polyp of colon    Dr. Oletta Lamas: last colonoscopy was 2017 and was normal.   5 yr recall.  [name] History of fracture of right ankle 05/2015  . HTN (hypertension)   . Hyperlipidemia   . Low back pain   . Nephrolithiasis 2015   CT showed 2cm left sided stone which was stable since 2006 imaging and likely in renal parenchyma.  2.5 cm as of 08/2017 neph f/u: renal u/s 12/2017-->no hydro. KUB showed stable L sided 18 mm stone. Cystoscopy 12/2017 erythematous mucosa at done, no mass.   . Obesity, Class II, BMI 35-39.9   . OSTEOMYELITIS, CHRONIC 02/21/2006   Staph infection s/p lumbar surgery (surgery x 3).  Annotation: previous methicillin sensitive  staphylococcus aureus 2006 Qualifier: Diagnosis of  By: Johnnye Sima MD, Dellis Filbert  (ID Specialist)    . Prostate cancer screening    via Urol--> PSA 1.39 April 2019.  [name] Tinnitus, left ear     Past Surgical History:  Procedure Laterality Date  . COLONOSCOPY  10/12/2015   repeat 5 years  . COLONOSCOPY W/ POLYPECTOMY  2007    Negative 2012 and 2017; Dr Ollen Barges 2022.  . LUMBAR LAMINECTOMY     Dr Vertell Limber performed 2nd & 3rd procedures (pt also has hx of fall and vertebral fracture years prior to his surgeries.  . TONSILLECTOMY      Outpatient Medications Prior to Visit  Medication Sig Dispense Refill  . Ascorbic Acid (VITAMIN C) 1000 MG  tablet Take 1,000 mg by mouth daily.    [name] aspirin 81 MG tablet Take 81 mg by mouth daily.     [name] atorvastatin (LIPITOR) 20 MG tablet TAKE 1 TABLET BY MOUTH DAILY (Patient taking differently: Take 20 mg by mouth daily. ) 90 tablet 1  . ciprofloxacin (CIPRO) 500 MG tablet Take 1 tablet by mouth daily.  0  . finasteride (PROSCAR) 5 MG tablet TAKE 1 TABLET BY MOUTH DAILY (Patient taking differently: Take 5 mg by mouth daily. ) 90 tablet 1  . losartan (COZAAR) 50 MG tablet TAKE 1 TABLET BY MOUTH DAILY (Patient taking differently: Take 50 mg by mouth daily. ) 90 tablet 1  . meloxicam (MOBIC) 15 MG tablet TAKE 1 TABLET BY MOUTH DAILY AS NEEDED FOR MUSCULOSKELETAL PAIN AS DIRECTED (Patient taking differently: Take 15 mg by mouth daily. ) 90 tablet 1  . metoprolol succinate (TOPROL-XL) 25 MG 24 hr tablet TAKE 1 TABLET BY MOUTH DAILY (Patient taking differently: Take 25 mg by mouth daily. ) 90 tablet 0  . tamsulosin (FLOMAX) 0.4 MG CAPS capsule TAKE 1 CAPSULE BY MOUTH EVERY DAY (Patient taking differently: Take 0.4 mg by mouth daily. ) 90 capsule 0   No facility-administered medications prior to visit.     No Known Allergies  ROS  As per HPI  PE: Blood pressure 134/77, pulse 75, temperature 97.9 F (36.6 C), temperature source Oral, resp. rate 16, height 5\' 8"  (1.727 m), weight 255 lb 6 oz (115.8 kg), SpO2 95 %. Body mass index is 38.83 kg/m.  Gen: Alert, well appearing.  Patient is oriented to person, place, time, and situation. AFFECT: pleasant, lucid thought and speech. CV: RRR, no m/r/g.   LUNGS: CTA bilat, nonlabored resps, good aeration in all lung fields. EXT: no clubbing or cyanosis.  1+ bilat pitting edema.    LABS:  Lab Results  Component Value Date   TSH 1.40 07/05/2015   Lab Results  Component Value Date   WBC 8.6 06/05/2017   HGB 17.4 (H) 06/05/2017   HCT 48.8 06/05/2017   MCV 87.8 06/05/2017   PLT 161 06/05/2017   Lab Results  Component Value Date   CREATININE 1.17  08/15/2017   BUN 20 08/15/2017   NA 140 08/15/2017   K 4.1 08/15/2017   CL 104 08/15/2017   CO2 27 08/15/2017   Lab Results  Component Value Date   ALT 16 06/26/2016   AST 14 06/26/2016   ALKPHOS 65 06/26/2016   BILITOT 0.6 06/26/2016   Lab Results  Component Value Date   CHOL 130 08/28/2016   Lab Results  Component Value Date   HDL 45.20 08/28/2016   Lab Results  Component Value Date   LDLCALC 65 08/28/2016   Lab Results  Component Value Date   TRIG 99.0 08/28/2016   Lab Results  Component Value Date   CHOLHDL 3 08/28/2016   Lab Results  Component Value Date   PSA 1.95 06/21/2011   PSA 1.91 06/21/2010   PSA 1.53 06/25/2008    IMPRESSION AND PLAN:  1) HTN: The current medical regimen is effective;  continue present plan and medications. Lytes/cr today.  2) HLD: Tolerating statin.  FLP and hepatic panel today.  3) BPH--gets urolift procedure very soon and hopefully this will help him a lot.   An After Visit Summary was printed and given to the patient.  FOLLOW UP: Return in about 6 months (around 08/14/2018) for routine chronic illness f/u.  Signed:  Crissie Sickles, MD           02/13/2018

## 2018-02-14 ENCOUNTER — Other Ambulatory Visit: Payer: Self-pay

## 2018-02-14 ENCOUNTER — Encounter (HOSPITAL_COMMUNITY): Payer: Self-pay

## 2018-02-14 ENCOUNTER — Encounter (HOSPITAL_COMMUNITY)
Admission: RE | Admit: 2018-02-14 | Discharge: 2018-02-14 | Disposition: A | Discharge status: Home / Self Care | Payer: Medicare Other | Source: Ambulatory Visit | Attending: Urology | Admitting: Urology

## 2018-02-14 DIAGNOSIS — Z01812 Encounter for preprocedural laboratory examination: Secondary | ICD-10-CM | POA: Diagnosis not present

## 2018-02-14 HISTORY — DX: Unspecified hearing loss, right ear: H91.91

## 2018-02-14 HISTORY — DX: Gross hematuria: R31.0

## 2018-02-14 HISTORY — DX: Tinnitus, left ear: H93.12

## 2018-02-14 HISTORY — DX: Personal history of (healed) traumatic fracture: Z87.81

## 2018-02-14 HISTORY — DX: Personal history of other (healed) physical injury and trauma: Z87.828

## 2018-02-14 HISTORY — DX: Personal history of other infectious and parasitic diseases: Z86.19

## 2018-02-14 LAB — CBC
HCT: 50.4 % (ref 39.0–52.0)
HEMOGLOBIN: 17.5 g/dL — AB (ref 13.0–17.0)
MCH: 30.8 pg (ref 26.0–34.0)
MCHC: 34.7 g/dL (ref 30.0–36.0)
MCV: 88.7 fL (ref 78.0–100.0)
Platelets: 157 10*3/uL (ref 150–400)
RBC: 5.68 MIL/uL (ref 4.22–5.81)
RDW: 14.3 % (ref 11.5–15.5)
WBC: 6.8 10*3/uL (ref 4.0–10.5)

## 2018-02-14 NOTE — Pre-Procedure Instructions (Signed)
CMP results 02/13/2018 are in epic

## 2018-02-18 NOTE — H&P (Signed)
Office Visit Report     01/23/2018   --------------------------------------------------------------------------------   Derrick [name]  MRN: [id number]  PRIMARY CARE:  Shawnie Dapper, MD  DOB: [birth year], 67 year old Male  REFERRING:  Shawnie Dapper, MD  SSN: -[id number]  PROVIDER:  Festus Aloe, M.D.    LOCATION:  [facility] [phone]   --------------------------------------------------------------------------------   CC: I have symptoms of an enlarged prostate.  HPI: Derrick [name] is a 67 year-old male established patient who is here for symptoms of enlarged prostate.  He first noticed the symptoms approximately 09/19/2008. His symptoms have not gotten worse over the last year. He has been treated with Flomax and Proscar. The patient has never had a surgical procedure for bladder outlet obstruction to his prostate.   History of incomplete emptying with postvoid 148-184. Recurrent UTI in past.   May 2018 PSA 0.71. voiding well.   He had an episode of prostatitis, urinary tract infection March 2019 with Citrobacter. F/u no dysuria. On tamsulosin and finasteride. AUASS = 16. He also c/o wt gain and low libido. 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   He returns for repeat cystoscopy. His urine cx was negative when I checked and then I started him on cephalexin prior to repeat cysto today. Since then he had a symptomatic UTI with pseudomonas and is on Cipro.   He also had an episode of gross hematuria.     CC: I have kidney stones.  HPI: The problem is on the left side. He first stated noticing pain on approximately 08/20/2013. This is not his first kidney stone. He is not currently having flank pain, back pain, groin pain, nausea, vomiting, fever or chills.   Left kidney has a dense 2 cm stone stable going back through 2006. 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  In the right upper pole there was a 1.5 cm cyst versus prominent renal pyramid. On the left there is a 2-1/2 cm stone. Postvoid 148.5 mL. Stone seen on F/u CT in 2016 when it measured 18x13 mm.   [place] 11/2017 stable with no hydro. KUB stone stable at 18 mm. I reiviewd the images - pt asked about it. Again, no flank pain and stone appears stable.     ALLERGIES: No Allergies    MEDICATIONS: Cipro 500 mg tablet 1 tablet PO BID  Tamsulosin Hcl 0.4 mg capsule  Aspirin Ec 81 mg tablet, delayed release Oral  Finasteride 5 mg tablet 0 Oral  Losartan Potassium 50 mg tablet Oral  Metoprolol Tartrate 25 mg tablet Oral  Vitamin C 500 mg tablet Oral     Notes: medications verified with patient    GU PSH: Cystoscopy - 12/12/2017      PSH Notes: Back Surgery   NON-GU PSH: None   GU PMH: BPH w/o LUTS - 01/07/2018, - 12/12/2017, - 09/03/2017, - 10/02/2016 Renal calculus - 01/07/2018, - 12/12/2017, - 09/03/2017, Nephrolithiasis, - 2016 Microscopic hematuria - 12/12/2017 Weak Urinary Stream (Stable) - 09/03/2017, Weak urinary stream, - 2016 Chronic prostatitis (Worsening), Acute on chronic. - 07/23/2017 Prostate nodule w/ LUTS, Right lobe greater than left; also tender on exam. - 07/23/2017 Acute Cystitis/UTI, Acute cystitis without hematuria - 2016 Urinary Frequency, Increased urinary frequency - 2016 Urinary Tract Inf, Unspec site, Pyuria - 2016 BPH w/LUTS, Benign prostatic hypertrophy with urinary retention - 2016 Urinary Retention, Unspec, Incomplete bladder emptying - 2015  Paraphimosis, Paraphimosis - 2015 Bladder-neck stenosis/contracture, Bladder neck obstruction - 2014 Nocturia, Nocturia - 2014      PMH Notes:  2010-02-17 09:49:37 - Note: No Medical Problems   NON-GU PMH: Encounter for general adult medical examination without abnormal findings, Encounter for preventive health examination - 2015    FAMILY HISTORY: Family Health Status Number - Runs In Family Lung Cancer - Father Stroke Syndrome  - Mother   SOCIAL HISTORY: Marital Status: Married Preferred Language: English; Race: White Patient's occupation is/was semi retired.     Notes: Former Smoker, Caffeine Use, Marital History - Currently Married, Alcohol Use   REVIEW OF SYSTEMS:    GU Review Male:   Patient denies frequent urination, hard to postpone urination, burning/ pain with urination, get up at night to urinate, leakage of urine, stream starts and stops, trouble starting your stream, have to strain to urinate , erection problems, and penile pain.  Gastrointestinal (Upper):   Patient denies nausea, vomiting, and indigestion/ heartburn.  Gastrointestinal (Lower):   Patient denies diarrhea and constipation.  Constitutional:   Patient denies fever, night sweats, fatigue, and weight loss.  Skin:   Patient denies skin rash/ lesion and itching.  Eyes:   Patient denies blurred vision and double vision.  Ears/ Nose/ Throat:   Patient denies sore throat and sinus problems.  Hematologic/Lymphatic:   Patient denies swollen glands and easy bruising.  Cardiovascular:   Patient denies leg swelling and chest pains.  Respiratory:   Patient denies cough and shortness of breath.  Endocrine:   Patient denies excessive thirst.  Musculoskeletal:   Patient denies back pain and joint pain.  Neurological:   Patient denies headaches and dizziness.  Psychologic:   Patient denies depression and anxiety.   VITAL SIGNS:      01/23/2018 08:04 AM  BP 131/86 mmHg  Pulse 89 /min  Temperature 97.5 F / 36.3 C   GU PHYSICAL EXAMINATION:    Scrotum: No lesions. No edema. No cysts. No warts.  Penis: Circumcised, no foreskin warts, no cracks. No dorsal peyronie's plaques, no left corporal peyronie's plaques, no right corporal peyronie's plaques, no scarring, no shaft warts. No balanitis, no meatal stenosis.    MULTI-SYSTEM PHYSICAL EXAMINATION:    Constitutional: Well-nourished. No physical deformities. Normally developed. Good grooming.  Neck: Neck  symmetrical, not swollen. Normal tracheal position.  Respiratory: No labored breathing, no use of accessory muscles.   Cardiovascular: Normal temperature, normal extremity pulses, no swelling, no varicosities.  Skin: No paleness, no jaundice, no cyanosis. No lesion, no ulcer, no rash.  Neurologic / Psychiatric: Oriented to time, oriented to place, oriented to person. No depression, no anxiety, no agitation.  Gastrointestinal: No mass, no tenderness, no rigidity, non obese abdomen.     PAST DATA REVIEWED:  Source Of History:  Patient  X-Ray Review: KUB: Reviewed Films. 2019 C.T. Abdomen: Reviewed Films. 2016    09/03/17 09/26/16 06/10/14 04/24/13 08/22/11 12/09/09  PSA  Total PSA 1.39 ng/mL 0.71 ng/dl 1.92  2.37  2.10  1.97     09/03/17  Hormones  Testosterone, Total 475.1 ng/dL    PROCEDURES:         Flexible Cystoscopy - 52000  Risks, benefits, and some of the potential complications of the procedure were discussed with the patient. All questions were answered. Informed consent was obtained. Antibiotic prophylaxis was given -- Cipro. Sterile technique and intraurethral analgesia were used.  Meatus:  Normal size. Normal location. Normal condition.  Urethra:  No strictures.  External  Sphincter:  Normal.  Verumontanum:  Normal.  Prostate:  Obstructing. Moderate hyperplasia.  Bladder Neck:  Non-obstructing.  Ureteral Orifices:  Normal location. Normal size. Normal shape. Effluxed clear urine.  Bladder:  Erythematous mucosa -- at dome left and right. No trabeculation. No tumors. No stones.      The lower urinary tract was carefully examined. The procedure was well-tolerated and without complications. Antibiotic instructions were given. Instructions were given to call the office immediately for bloody urine, difficulty urinating, painful urination, fever, chills, nausea, vomiting or other illness. The patient stated that he understood these instructions and would comply with them.          Urinalysis w/Scope Dipstick Dipstick Cont'd Micro  Color: Yellow Bilirubin: Neg mg/dL WBC/hpf: 0 - 5/hpf  Appearance: Clear Ketones: Neg mg/dL RBC/hpf: 3 - 10/hpf  Specific Gravity: 1.025 Blood: Trace ery/uL Bacteria: Rare (0-9/hpf)  pH: 6.0 Protein: Neg mg/dL Cystals: NS (Not Seen)  Glucose: Neg mg/dL Urobilinogen: 0.2 mg/dL Casts: NS (Not Seen)    Nitrites: Neg Trichomonas: Not Present    Leukocyte Esterase: Neg leu/uL Mucous: Not Present      Epithelial Cells: 0 - 5/hpf      Yeast: NS (Not Seen)      Sperm: Not Present    ASSESSMENT:      ICD-10 Details  1 GU:   Renal calculus - N20.0   2   Weak Urinary Stream - R39.12 Stable  3   BPH w/LUTS - N40.1 Stable  4   Gross hematuria - R31.0    PLAN:            Medications Stop Meds: Doxycycline Hyclate 100 mg tablet 1 tablet PO BID  Start: 01/07/2018  Stop: 02/06/2018   Discontinue: 01/23/2018  - Reason: pt put on other abx Cephalexin 250 mg capsule 1 capsule po BID for 5 days and then one capsule po QHS  Start: 12/12/2017  Discontinue: 01/23/2018  - Reason: pt put on other abx           Orders Labs BUN/Creatinine  X-Ray Notes: . History:  Hematuria: Yes/No   Patient to see MD after exam: Yes/No   Previous exam: CT / IVP/ US/ KUB/ None   When:   Where:   Diabetic: Yes/ No   BUN/ Creatinine:   Date of last BUN Creatinine:   Weight in pounds:   Allergy- IV Contrast: Yes/ No   Conflicting diabetic meds: Yes/ No   Diabetic Meds:   Prior Authorization #:  . History: gross hematuria Hematuria: Yes/  Patient to see MD after exam:/No   Previous exam: CT   When:   Where:   Diabetic:/ No   BUN/ Creatinine:   Date of last BUN Creatinine: 01/23/18  Weight in pounds: 235  Allergy- IV Contrast/ No   Conflicting diabetic meds/ No   Diabetic Meds:   Prior Authorization #:            Schedule X-Rays: 1 Week - C.T. Hematuria With and Without I.V. Contrast  Return Visit/Planned Activity:  Next Available Appointment - Schedule Surgery          Document Letter(s):  Created for Patient: Clinical Summary         Notes:   bladder erythema - discussed the nature r/b/a to cysto and bbx and he elects to proceed.   BPH - recurrent UTI on tams and finasteride. Discussed the nature r/b/a to Urolift including laser or TURP and continuing meds. He elects  to proceed with Urolift during bladder biopsy. We side effects of Urolift, expected post-op course and likelihood of success. We discussed flow symptoms and irritative symptoms typically improve, but frequency and urgency can persist and rarely worsen. We also discussed risk of bleeding, infection, stricture, sexual dysfunction and incontinence among others. All questions answered.   gross hematuria - given the bladder findings and the kidney stone will assess with CT hematuria protocol.   kidney stone - this is likely stable but will eval as above with CT.   cc: Dr. Anitra Lauth         Next Appointment:      Next Appointment: 01/30/2018 08:45 AM    Appointment Type: CT Scan    Location: [facility] [phone]    Provider: CT CT    Reason for Visit: Ct hematuria/ gross hematuria/Amiley Shishido      ** Signed by Festus Aloe, M.D. on 01/24/18 at 11:30 AM (EDT)**     The information contained in this medical record document is considered private and confidential patient information. This information can only be used for the medical diagnosis and/or medical services that are being provided by the patient's selected caregivers. This information can only be distributed outside of the patient's care if the patient agrees and signs waivers of authorization for this information to be sent to an outside source or route.

## 2018-02-19 ENCOUNTER — Other Ambulatory Visit: Payer: Self-pay

## 2018-02-19 ENCOUNTER — Encounter (HOSPITAL_COMMUNITY)
Admission: RE | Disposition: A | Discharge status: Home / Self Care | Payer: Self-pay | Source: Ambulatory Visit | Attending: Urology

## 2018-02-19 ENCOUNTER — Ambulatory Visit (HOSPITAL_COMMUNITY): Payer: Medicare Other | Admitting: Anesthesiology

## 2018-02-19 ENCOUNTER — Encounter (HOSPITAL_COMMUNITY): Payer: Self-pay

## 2018-02-19 ENCOUNTER — Ambulatory Visit (HOSPITAL_COMMUNITY)
Admission: RE | Admit: 2018-02-19 | Discharge: 2018-02-19 | Disposition: A | Discharge status: Home / Self Care | Payer: Medicare Other | Source: Ambulatory Visit | Attending: Urology | Admitting: Urology

## 2018-02-19 DIAGNOSIS — Z6838 Body mass index (BMI) 38.0-38.9, adult: Secondary | ICD-10-CM | POA: Insufficient documentation

## 2018-02-19 DIAGNOSIS — Z87440 Personal history of urinary (tract) infections: Secondary | ICD-10-CM | POA: Insufficient documentation

## 2018-02-19 DIAGNOSIS — I10 Essential (primary) hypertension: Secondary | ICD-10-CM | POA: Diagnosis not present

## 2018-02-19 DIAGNOSIS — R3912 Poor urinary stream: Secondary | ICD-10-CM | POA: Diagnosis not present

## 2018-02-19 DIAGNOSIS — N3289 Other specified disorders of bladder: Secondary | ICD-10-CM

## 2018-02-19 DIAGNOSIS — N401 Enlarged prostate with lower urinary tract symptoms: Secondary | ICD-10-CM | POA: Insufficient documentation

## 2018-02-19 DIAGNOSIS — Z87891 Personal history of nicotine dependence: Secondary | ICD-10-CM | POA: Insufficient documentation

## 2018-02-19 DIAGNOSIS — Z79899 Other long term (current) drug therapy: Secondary | ICD-10-CM | POA: Diagnosis not present

## 2018-02-19 DIAGNOSIS — N200 Calculus of kidney: Secondary | ICD-10-CM | POA: Diagnosis not present

## 2018-02-19 DIAGNOSIS — N3091 Cystitis, unspecified with hematuria: Secondary | ICD-10-CM | POA: Insufficient documentation

## 2018-02-19 DIAGNOSIS — Z823 Family history of stroke: Secondary | ICD-10-CM | POA: Diagnosis not present

## 2018-02-19 DIAGNOSIS — N3030 Trigonitis without hematuria: Secondary | ICD-10-CM | POA: Diagnosis not present

## 2018-02-19 DIAGNOSIS — Z7982 Long term (current) use of aspirin: Secondary | ICD-10-CM | POA: Insufficient documentation

## 2018-02-19 DIAGNOSIS — N138 Other obstructive and reflux uropathy: Secondary | ICD-10-CM

## 2018-02-19 DIAGNOSIS — N3010 Interstitial cystitis (chronic) without hematuria: Secondary | ICD-10-CM | POA: Diagnosis not present

## 2018-02-19 HISTORY — PX: CYSTOSCOPY WITH BIOPSY: SHX5122

## 2018-02-19 HISTORY — PX: CYSTOSCOPY WITH INSERTION OF UROLIFT: SHX6678

## 2018-02-19 SURGERY — CYSTOSCOPY, WITH BIOPSY
Anesthesia: General

## 2018-02-19 MED ORDER — EPHEDRINE SULFATE 50 MG/ML IJ SOLN
INTRAMUSCULAR | Status: DC | PRN
  Administered 2018-02-19: 15 mg via INTRAVENOUS
  Administered 2018-02-19: 5 mg via INTRAVENOUS

## 2018-02-19 MED ORDER — LACTATED RINGERS IV SOLN
INTRAVENOUS | Status: DC
  Administered 2018-02-19: 08:00:00 via INTRAVENOUS

## 2018-02-19 MED ORDER — DEXAMETHASONE SODIUM PHOSPHATE 10 MG/ML IJ SOLN
INTRAMUSCULAR | Status: DC | PRN
  Administered 2018-02-19: 10 mg via INTRAVENOUS

## 2018-02-19 MED ORDER — PROPOFOL 10 MG/ML IV BOLUS
INTRAVENOUS | Status: DC | PRN
  Administered 2018-02-19: 200 mg via INTRAVENOUS

## 2018-02-19 MED ORDER — PROPOFOL 10 MG/ML IV BOLUS
INTRAVENOUS | Status: AC
  Filled 2018-02-19: qty 20

## 2018-02-19 MED ORDER — FENTANYL CITRATE (PF) 100 MCG/2ML IJ SOLN
INTRAMUSCULAR | Status: AC
  Filled 2018-02-19: qty 2

## 2018-02-19 MED ORDER — FENTANYL CITRATE (PF) 100 MCG/2ML IJ SOLN
INTRAMUSCULAR | Status: DC | PRN
  Administered 2018-02-19: 50 ug via INTRAVENOUS
  Administered 2018-02-19 (×4): 25 ug via INTRAVENOUS

## 2018-02-19 MED ORDER — SODIUM CHLORIDE 0.9 % IR SOLN
Status: DC | PRN
  Administered 2018-02-19: 3000 mL

## 2018-02-19 MED ORDER — SODIUM CHLORIDE 0.9 % IV SOLN
2.0000 g | Freq: Once | INTRAVENOUS | Status: AC
  Administered 2018-02-19: 2 g via INTRAVENOUS
  Filled 2018-02-19: qty 2

## 2018-02-19 MED ORDER — ONDANSETRON HCL 4 MG/2ML IJ SOLN
INTRAMUSCULAR | Status: AC
  Filled 2018-02-19: qty 2

## 2018-02-19 MED ORDER — ONDANSETRON HCL 4 MG/2ML IJ SOLN
INTRAMUSCULAR | Status: DC | PRN
  Administered 2018-02-19: 4 mg via INTRAVENOUS

## 2018-02-19 MED ORDER — STERILE WATER FOR IRRIGATION IR SOLN
Status: DC | PRN
  Administered 2018-02-19: 3000 mL

## 2018-02-19 MED ORDER — CIPROFLOXACIN HCL 500 MG PO TABS: 500.0000 mg | ORAL_TABLET | Freq: Two times a day (BID) | ORAL | 0 refills | Status: AC

## 2018-02-19 MED ORDER — 0.9 % SODIUM CHLORIDE (POUR BTL) OPTIME
TOPICAL | Status: DC | PRN
  Administered 2018-02-19: 1000 mL

## 2018-02-19 MED ORDER — LIDOCAINE HCL (CARDIAC) PF 100 MG/5ML IV SOSY
PREFILLED_SYRINGE | INTRAVENOUS | Status: DC | PRN
  Administered 2018-02-19: 60 mg via INTRAVENOUS

## 2018-02-19 SURGICAL SUPPLY — 15 items
BAG URINE DRAINAGE (UROLOGICAL SUPPLIES) ×3 IMPLANT
BAG URO CATCHER STRL LF (MISCELLANEOUS) ×3 IMPLANT
CATH TIEMANN FOLEY 18FR 5CC (CATHETERS) ×3 IMPLANT
DRSG TELFA 3X8 NADH (GAUZE/BANDAGES/DRESSINGS) ×3 IMPLANT
GLOVE BIOGEL M STRL SZ7.5 (GLOVE) ×3 IMPLANT
GOWN STRL REUS W/TWL XL LVL3 (GOWN DISPOSABLE) ×3 IMPLANT
LOOP CUT BIPOLAR 24F LRG (ELECTROSURGICAL) IMPLANT
MANIFOLD NEPTUNE II (INSTRUMENTS) ×3 IMPLANT
NDL SAFETY ECLIPSE 18X1.5 (NEEDLE) ×1 IMPLANT
NEEDLE HYPO 18GX1.5 SHARP (NEEDLE) ×2
PACK CYSTO (CUSTOM PROCEDURE TRAY) ×3 IMPLANT
SYSTEM UROLIFT (Male Continence) ×18 IMPLANT
TUBING CONNECTING 10 (TUBING) ×2 IMPLANT
TUBING CONNECTING 10' (TUBING) ×1
TUBING UROLOGY SET (TUBING) ×3 IMPLANT

## 2018-02-19 NOTE — Discharge Instructions (Signed)
Indwelling Urinary Catheter Care, Adult Take good care of your catheter to keep it working and to prevent problems. How to wear your catheter Attach your catheter to your leg with tape (adhesive tape) or a leg strap. Make sure it is not too tight. If you use tape, remove any bits of tape that are already on the catheter. How to wear a drainage bag You should have:  A large overnight bag.  A small leg bag.  Overnight Bag You may wear the overnight bag at any time. Always keep the bag below the level of your bladder but off the floor. When you sleep, put a clean plastic bag in a wastebasket. Then hang the bag inside the wastebasket. Leg Bag Never wear the leg bag at night. Always wear the leg bag below your knee. Keep the leg bag secure with a leg strap or tape. How to care for your skin  Clean the skin around the catheter at least once every day.  Shower every day. Do not take baths.  Put creams, lotions, or ointments on your genital area only as told by your doctor.  Do not use powders, sprays, or lotions on your genital area. How to clean your catheter and your skin 1. Wash your hands with soap and water. 2. Wet a washcloth in warm water and gentle (mild) soap. 3. Use the washcloth to clean the skin where the catheter enters your body. Clean downward and wipe away from the catheter in small circles. Do not wipe toward the catheter. 4. Pat the area dry with a clean towel. Make sure to clean off all soap. How to care for your drainage bags Empty your drainage bag when it is ?- full or at least 2-3 times a day. Replace your drainage bag once a month or sooner if it starts to smell bad or look dirty. Do not clean your drainage bag unless told by your doctor. Emptying a drainage bag  Supplies Needed  Rubbing alcohol.  Gauze pad or cotton ball.  Tape or a leg strap.  Steps 1. Wash your hands with soap and water. 2. Separate (detach) the bag from your leg. 3. Hold the bag over  the toilet or a clean container. Keep the bag below your hips and bladder. This stops pee (urine) from going back into the tube. 4. Open the pour spout at the bottom of the bag. 5. Empty the pee into the toilet or container. Do not let the pour spout touch any surface. 6. Put rubbing alcohol on a gauze pad or cotton ball. 7. Use the gauze pad or cotton ball to clean the pour spout. 8. Close the pour spout. 9. Attach the bag to your leg with tape or a leg strap. 10. Wash your hands.  Changing a drainage bag Supplies Needed  Alcohol wipes.  A clean drainage bag.  Adhesive tape or a leg strap.  Steps 1. Wash your hands with soap and water. 2. Separate the dirty bag from your leg. 3. Pinch the rubber catheter with your fingers so that pee does not spill out. 4. Separate the catheter tube from the drainage tube where these tubes connect (at the connection valve). Do not let the tubes touch any surface. 5. Clean the end of the catheter tube with an alcohol wipe. Use a different alcohol wipe to clean the end of the drainage tube. 6. Connect the catheter tube to the drainage tube of the clean bag. 7. Attach the new bag to  the leg with adhesive tape or a leg strap. 8. Wash your hands.  How to prevent infection and other problems  Never pull on your catheter or try to remove it. Pulling can damage tissue in your body.  Always wash your hands before and after touching your catheter.  If a leg strap gets wet, replace it with a dry one.  Drink enough fluids to keep your pee clear or pale yellow, or as told by your doctor.  Do not let the drainage bag or tubing touch the floor.  Wear cotton underwear.  If you are male, wipe from front to back after you poop (have a bowel movement).  Check on the catheter often to make sure it works and the tubing is not twisted. Get help if:  Your pee is cloudy.  Your pee smells unusually bad.  Your pee is not draining into the bag.  Your  tube gets clogged.  Your catheter starts to leak.  Your bladder feels full. Get help right away if:  You have redness, swelling, or pain where the catheter enters your body.  You have fluid, pus, or a bad smell coming from the area where the catheter enters your body.  The area where the catheter enters your body feels warm.  You have a fever.  You have pain in your: ? Stomach (abdomen). ? Legs. ? Lower back. ? Bladder.  You see blood fill the catheter.  Your pee is pink or red.  You feel sick to your stomach (nauseous).  You throw up (vomit).  You have chills.  Your catheter gets pulled out. This information is not intended to replace advice given to you by your health care provider. Make sure you discuss any questions you have with your health care provider. Document Released: 09/02/2012 Document Revised: 04/05/2016 Document Reviewed: 10/21/2013 Elsevier Interactive Patient Education  2018 [employer].  Prostatic Urethral Lift, Care After This sheet gives you information about how to care for yourself after your procedure. Your health care provider may also give you more specific instructions. If you have problems or questions, contact your health care provider. What can I expect after the procedure? After the procedure, it is common to have:  Discomfort or burning when urinating.  An increased urge to urinate.  More frequent urination.  Urine that is slightly blood-tinged.  These symptoms should go away after a few days. Follow these instructions at home:  Take over-the-counter and prescription medicines only as told by your health care provider.  Do not drive for 24 hours if you were given a medicine to help you relax (sedative).  Do not drive or use heavy machinery while taking prescription pain medicine.  Do not lift anything that is heavier than 10 lb (4.5 kg) until your health care provider says that this is safe.  Return to your normal activities  as told by your health care provider. Ask your health care provider what activities are safe for you. Ask when you can return to sexual activity.  Drink enough fluid to keep your urine clear or pale yellow.  Keep all follow-up visits as told by your health care provider. This is important. Contact a health care provider if:  You have chills or a fever.  You have pain when passing urine.  You have bright red blood or blood clots in your urine.  You have difficulty passing urine.  You have leaking of urine (incontinence). Get help right away if:  You have chest pain  or shortness of breath.  You have leg pain or swelling.  You cannot pass urine. Summary  After the procedure, it is common to have discomfort or burning when urinating, an increased urge to urinate, more frequent urination, and urine that is slightly blood-tinged.  Do not drive for 24 hours if you were given a medicine to help you relax (sedative). Do not drive or use heavy machinery while taking prescription pain medicine.  Do not lift anything that is heavier than 10 lb (4.5 kg) until your health care provider says that this is safe.  Return to your normal activities as told by your health care provider. This information is not intended to replace advice given to you by your health care provider. Make sure you discuss any questions you have with your health care provider. Document Released: 06/27/2016 Document Revised: 06/27/2016 Document Reviewed: 06/27/2016 Elsevier Interactive Patient Education  2018 [employer].

## 2018-02-19 NOTE — Anesthesia Procedure Notes (Signed)
Procedure Name: LMA Insertion Date/Time: 02/19/2018 9:26 AM Performed by: Glory Buff, CRNA Pre-anesthesia Checklist: Patient identified, Emergency Drugs available, Patient being monitored and Suction available Patient Re-evaluated:Patient Re-evaluated prior to induction Oxygen Delivery Method: Circle system utilized Preoxygenation: Pre-oxygenation with 100% oxygen Induction Type: IV induction LMA: LMA inserted LMA Size: 5.0 Number of attempts: 1 Placement Confirmation: positive ETCO2 Tube secured with: Tape Dental Injury: Teeth and Oropharynx as per pre-operative assessment

## 2018-02-19 NOTE — Anesthesia Preprocedure Evaluation (Addendum)
Anesthesia Evaluation  Patient identified by MRN, date of birth, ID band Patient awake    Reviewed: Allergy & Precautions, NPO status , Patient's Chart, lab work & pertinent test results, reviewed documented beta blocker date and time   History of Anesthesia Complications Negative for: history of anesthetic complications  Airway Mallampati: IV  TM Distance: >3 FB Neck ROM: Full    Dental  (+) Teeth Intact   Pulmonary neg shortness of breath, neg sleep apnea, neg COPD, neg recent URI, former smoker,    breath sounds clear to auscultation       Cardiovascular hypertension, Pt. on medications and Pt. on home beta blockers  Rhythm:Regular     Neuro/Psych negative neurological ROS  negative psych ROS   GI/Hepatic negative GI ROS, Neg liver ROS,   Endo/Other  Morbid obesity  Renal/GU Renal disease     Musculoskeletal   Abdominal   Peds  Hematology   Anesthesia Other Findings   Reproductive/Obstetrics                             Anesthesia Physical Anesthesia Plan  ASA: II  Anesthesia Plan: General   Post-op Pain Management:    Induction: Intravenous  PONV Risk Score and Plan: 2 and Ondansetron and Dexamethasone  Airway Management Planned: LMA and Oral ETT  Additional Equipment: None  Intra-op Plan:   Post-operative Plan: Extubation in OR  Informed Consent: I have reviewed the patients History and Physical, chart, labs and discussed the procedure including the risks, benefits and alternatives for the proposed anesthesia with the patient or authorized representative who has indicated his/her understanding and acceptance.   Dental advisory given  Plan Discussed with: CRNA and Surgeon  Anesthesia Plan Comments:         Anesthesia Quick Evaluation

## 2018-02-19 NOTE — Interval H&P Note (Signed)
History and Physical Interval Note:  02/19/2018 9:09 AM  Derrick [name]  has presented today for surgery - I should add we also discussed the left renal stone could be a source of infection but there is no hydro of the left kidney. The stone is likely in a diverticulum and maybe a few larger compared to 3 years ago. We discussed management with surveillance, staged URS or PCNL. Pt favors surveillance for now.    Festus Aloe

## 2018-02-19 NOTE — Interval H&P Note (Signed)
History and Physical Interval Note:  02/19/2018 9:01 AM  Derrick [name]  has presented today for surgery, with the diagnosis of BENIGN PROSTATIC HYPERPLASIA, WEAK URINARY STREAM, BLADDER ERYTHEMA  I went over again today with the patient and his wife the the nature, potential benefits, risks and alternatives to Procedure(s): CYSTOSCOPY WITH BIOPSY/ FULGURATION / UROLIFT (N/A)  including side effects of the proposed treatment, the likelihood of the patient achieving the goals of the procedure, and any potential problems that might occur during the procedure or recuperation. The patient's history has been reviewed, patient examined, no change in status, stable for surgery. He is well with no dysuria, fever or cloudy urine. Urine is clear.  I have reviewed the patient's chart and labs.  Questions were answered to the patient's satisfaction. Patient elects to proceed.      Festus Aloe

## 2018-02-19 NOTE — Op Note (Signed)
Preoperative diagnosis: Bladder erythema, BPH with lower urinary tract symptoms, weak stream Postoperative diagnosis: Same  Procedure: Cystoscopy with bladder biopsy and fulguration 2 to 5 cm; UroLift prostatic urethral implant with placement of 6 implants  Surgeon: Junious Silk  Anesthesia: General  Indication for procedure: 67 year old with recurrent urinary tract infection, BPH and obstructive voiding symptoms.  On cystoscopy was noted to have erythematous areas near the dome and was brought for biopsy today and UroLift prostatic urethral implant.  Findings: On cystoscopy the urethra was unremarkable, the prostatic urethra was obstructed primarily from a high bladder neck and some lateral lobe hypertrophy.  The ureteral orifices appeared normal with clear reflux bilaterally.  There was no stone or foreign body in the bladder.  The bladder was moderately trabeculated.  At the dome there were erythematous areas aligned on the right and a deeper line on the left that was a bleeding with distention.  I swept the blood/clot of the bladder wall and there appeared to be 2 small ulcerated areas at the left.  The prostatic urethra required 6 implants with stacking of the proximal implants to open the bladder neck.  Description of procedure: After consent was obtained patient brought to the operating room.  After adequate anesthesia was placed in lithotomy position and prepped and draped in the usual sterile fashion.  A timeout was performed to confirm the patient and procedure.  The cystoscope was passed per urethra in the prostatic urethra and then the bladder were carefully inspected.  I inspected the bladder with a 30 degree and 70 degree lens.  The 30 degree lens was replaced and a biopsy of the right dome which was not so much right at the dome but spreading down more laterally.  I then fulgurated these areas with the Bugbee.  The 2 ulcerated areas on the left were then biopsied in this area fulgurated  with some erythematous mucosa surrounding the ulcerated areas for a total area of about 3 cm.  Again the left side spread down laterally.  Hemostasis was excellent at low pressure.  The 53F cystoscope was inserted into the bladder. The cystoscopy bridge was replaced with a UroLift delivery device.The first treatment site was the patient's left side approximately 1.5 cm distal to the bladder neck. The distal tip of the delivery device was then angled laterally approximately 20 degrees at this position to compress the lateral lobe. The trigger was pulled, thereby deploying a needle containing the implant through the prostate. The needle was then retracted, allowing one end of the implant to be delivered to the capsular surface of the prostate. The implant was then tensioned to assure capsular seating and removal of slack monofilament. The device was then angled back toward midline and slowly advanced proximally until cystoscopic verification of the monofilament being centered in the delivery bay. The urethral end piece was then affixed to the monofilament thereby tailoring the size of the implant. Excess filament was then severed. The delivery device was then re-advanced into the bladder. The delivery device was then replaced with cystoscope and bridge and the implant location and opening effect was confirmed cystoscopically. The same procedure was then repeated on the right side. Two additional implants were delivered just proximal to the verumontanum, again one on left and one on the right following the same technique. There was cat effect on the bladder neck and therefore I decided to stack 2 more implants above the proximal bladder neck implants by placing one on the left anteriorly and slightly more  distal than the first one and on the right anterior and slightly distal to the first one.  See the A final cystoscopy was conducted first to inspect the location and state of each implant and second, to confirm the  presence of a continuous anterior channel was present through the prostatic urethra with irrigation flow turned off. Six Implants were delivered in total. Following this, the scope was removed and an 78 French Foley catheter was placed and hooked to dependent drainage. Irrigation was very light pink. He was awakened and taken to the recovery room in stable condition.  He tolerated the procedure well.  Complications: None  Drains: 18 French Foley catheter, to leg bag.  Estimated blood loss: Less than 25 mL  Disposition: Patient stable to PACU

## 2018-02-19 NOTE — Transfer of Care (Signed)
Immediate Anesthesia Transfer of Care Note  Patient: Derrick [name]  Procedure(s) Performed: CYSTOSCOPY WITH BIOPSY/ FULGURATION / UROLIFT (N/A )  Patient Location: PACU  Anesthesia Type:General  Level of Consciousness: awake, alert , oriented and patient cooperative  Airway & Oxygen Therapy: Patient Spontanous Breathing and Patient connected to face mask oxygen  Post-op Assessment: Report given to RN, Post -op Vital signs reviewed and stable and Patient moving all extremities  Post vital signs: Reviewed and stable  Last Vitals:  Vitals Value Taken Time  BP 148/93 02/19/2018 10:46 AM  Temp    Pulse 99 02/19/2018 10:47 AM  Resp 19 02/19/2018 10:47 AM  SpO2 97 % 02/19/2018 10:47 AM  Vitals shown include unvalidated device data.  Last Pain:  Vitals:   02/19/18 0720  TempSrc: Oral         Complications: No apparent anesthesia complications

## 2018-02-20 ENCOUNTER — Encounter: Payer: Self-pay | Admitting: Family Medicine

## 2018-02-21 ENCOUNTER — Encounter (HOSPITAL_COMMUNITY): Payer: Self-pay | Admitting: Urology

## 2018-02-21 NOTE — Anesthesia Postprocedure Evaluation (Signed)
Anesthesia Post Note  Patient: Derrick [name]  Procedure(s) Performed: CYSTOSCOPY WITH BIOPSY/ FULGURATION / UROLIFT (N/A )     Patient location during evaluation: PACU Anesthesia Type: General Level of consciousness: awake and alert Pain management: pain level controlled Vital Signs Assessment: post-procedure vital signs reviewed and stable Respiratory status: spontaneous breathing, nonlabored ventilation, respiratory function stable and patient connected to nasal cannula oxygen Cardiovascular status: blood pressure returned to baseline and stable Postop Assessment: no apparent nausea or vomiting Anesthetic complications: no    Last Vitals:  Vitals:   02/19/18 1115 02/19/18 1200  BP: 112/73 (!) 127/92  Pulse: 90 85  Resp: 18   Temp:  36.8 C  SpO2: 93% 97%    Last Pain:  Vitals:   02/19/18 1200  TempSrc:   PainSc: 0-No pain                 Arleigh Odowd

## 2018-02-22 DIAGNOSIS — N401 Enlarged prostate with lower urinary tract symptoms: Secondary | ICD-10-CM | POA: Diagnosis not present

## 2018-02-22 DIAGNOSIS — R3912 Poor urinary stream: Secondary | ICD-10-CM | POA: Diagnosis not present

## 2018-02-22 DIAGNOSIS — R310 Gross hematuria: Secondary | ICD-10-CM | POA: Diagnosis not present

## 2018-03-06 DIAGNOSIS — N400 Enlarged prostate without lower urinary tract symptoms: Secondary | ICD-10-CM | POA: Diagnosis not present

## 2018-03-11 ENCOUNTER — Other Ambulatory Visit: Payer: Self-pay | Admitting: Family Medicine

## 2018-03-12 NOTE — Telephone Encounter (Signed)
RF request for meloxicam LOV: 02/13/18 Next ov:  08/13/18 Last written: 09/17/17 #90 w/ 1RF  Please advise. Thanks.

## 2018-05-02 ENCOUNTER — Other Ambulatory Visit: Payer: Self-pay | Admitting: Family Medicine

## 2018-06-03 DIAGNOSIS — N400 Enlarged prostate without lower urinary tract symptoms: Secondary | ICD-10-CM | POA: Diagnosis not present

## 2018-06-09 ENCOUNTER — Encounter: Payer: Self-pay | Admitting: Family Medicine

## 2018-07-02 ENCOUNTER — Other Ambulatory Visit: Payer: Self-pay | Admitting: Family Medicine

## 2018-07-02 MED ORDER — MELOXICAM 15 MG PO TABS: ORAL_TABLET | ORAL | 0 refills | Status: DC

## 2018-07-02 NOTE — Telephone Encounter (Signed)
Copied from [place] [id number]. Topic: Quick Communication - Rx Refill/Question >> Jul 02, 2018  9:43 AM Wynetta Emery, [place] C wrote: Medication: meloxicam (MOBIC) 15 MG tablet - 90 day supply   - pt has 1 pill left.   Has the patient contacted their pharmacy? Yes  (Agent: If no, request that the patient contact the pharmacy for the refill.) (Agent: If yes, when and what did the pharmacy advise?)  Preferred Pharmacy (with phone number or street name): [id number] [place], [address]  Agent: Please be advised that RX refills may take up to 3 business days. We ask that you follow-up with your pharmacy.

## 2018-07-02 NOTE — Telephone Encounter (Signed)
Requested Prescriptions  Pending Prescriptions Disp Refills  . meloxicam (MOBIC) 15 MG tablet 90 tablet 1    Sig: TAKE 1 TABLET BY MOUTH DAILY AS NEEDED FOR MUSCULOSKELETAL PAIN AS DIRECTED     Analgesics:  COX2 Inhibitors Failed - 07/02/2018  9:50 AM      Failed - HGB in normal range and within 360 days    Hemoglobin  Date Value Ref Range Status  02/14/2018 17.5 (H) 13.0 - 17.0 g/dL Final         Passed - Cr in normal range and within 360 days    Creat  Date Value Ref Range Status  01/01/2013 1.12 0.50 - 1.35 mg/dL Final   Creatinine, Ser  Date Value Ref Range Status  02/13/2018 1.00 0.40 - 1.50 mg/dL Final         Passed - Patient is not pregnant      Passed - Valid encounter within last 12 months    Recent Outpatient Visits          4 months ago Essential hypertension   [place], Adrian Blackwater, MD   10 months ago Essential hypertension   [place], Adrian Blackwater, MD   2 years ago Essential hypertension    [place], Adrian Blackwater, MD   2 years ago Viral URI   [place], Adrian Blackwater, MD   2 years ago Acute bronchitis, unspecified organism   [place], Adrian Blackwater, MD      Future Appointments            In 1 month [place], Adrian Blackwater, MD [place], [facility]

## 2018-07-04 DIAGNOSIS — N401 Enlarged prostate with lower urinary tract symptoms: Secondary | ICD-10-CM | POA: Diagnosis not present

## 2018-07-04 LAB — PSA
PSA: 1.36
PSA: 1.36

## 2018-07-18 ENCOUNTER — Telehealth: Payer: Self-pay | Admitting: Family Medicine

## 2018-07-18 ENCOUNTER — Other Ambulatory Visit: Payer: Self-pay | Admitting: Family Medicine

## 2018-07-18 MED ORDER — ATORVASTATIN CALCIUM 20 MG PO TABS: 20.0000 mg | ORAL_TABLET | Freq: Every day | ORAL | 1 refills | Status: DC

## 2018-07-18 MED ORDER — FINASTERIDE 5 MG PO TABS: 5.0000 mg | ORAL_TABLET | Freq: Every day | ORAL | 0 refills | Status: DC

## 2018-07-18 MED ORDER — TAMSULOSIN HCL 0.4 MG PO CAPS: 0.4000 mg | ORAL_CAPSULE | Freq: Every day | ORAL | 0 refills | Status: DC

## 2018-07-18 MED ORDER — LOSARTAN POTASSIUM 50 MG PO TABS: 50.0000 mg | ORAL_TABLET | Freq: Every day | ORAL | 0 refills | Status: DC

## 2018-07-18 MED ORDER — METOPROLOL SUCCINATE ER 25 MG PO TB24: 25.0000 mg | ORAL_TABLET | Freq: Every day | ORAL | 0 refills | Status: DC

## 2018-07-18 NOTE — Telephone Encounter (Signed)
Patient called, left VM to return call to the office to give which [facility]. Wife called and no answer on her phone.

## 2018-07-18 NOTE — Telephone Encounter (Signed)
Copied from [place] [id number]. Topic: Quick Communication - Rx Refill/Question >> Jul 18, 2018 10:20 AM Virl Axe D wrote: Medication: atorvastatin (LIPITOR) 20 MG tablet / Pt's wife stated they had to change they pharmacy and asked that all of the pt's rx's be transferred two weeks ago. Pt is now out of atorvastatin. Requesting refill AND [facility]. Please advise.  Has the patient contacted their pharmacy? Yes.   (Agent: If no, request that the patient contact the pharmacy for the refill.) (Agent: If yes, when and what did the pharmacy advise?)  Preferred Pharmacy (with phone number or street name): [id number] [place], [place] [phone] (Phone) [phone] (Fax)  Agent: Please be advised that RX refills may take up to 3 business days. We ask that you follow-up with your pharmacy.

## 2018-07-18 NOTE — Addendum Note (Signed)
Addended by: Dimple Nanas on: 07/18/2018 12:23 PM   Modules accepted: Orders

## 2018-07-18 NOTE — Telephone Encounter (Signed)
Patient is switching from [facility] to [place]. Please re-send Rx's that were sent earlier today to [place].

## 2018-07-18 NOTE — Telephone Encounter (Signed)
Pt's wife called back.  She just states "all his medications".  She is absolutely positive he is completely out of Atorvastatin right now and needs that refilled. She will call back once she gets to his medication bottles with the names of the other medicines.

## 2018-07-18 NOTE — Telephone Encounter (Signed)
[facility]   Pt advised and voiced understanding.

## 2018-08-05 ENCOUNTER — Telehealth: Payer: Self-pay | Admitting: Family Medicine

## 2018-08-05 ENCOUNTER — Other Ambulatory Visit: Payer: Self-pay | Admitting: Family Medicine

## 2018-08-05 NOTE — Telephone Encounter (Signed)
Copied from [place] [id number]. Topic: Quick Communication - Rx Refill/Question >> Aug 05, 2018  8:21 AM Rayann Heman wrote: Medication:losartan (COZAAR) 50 MG tablet [[id number]]  pt called and stated that pharmacy has medication on back order and would like to know I something else could be called in. Please advise

## 2018-08-05 NOTE — Telephone Encounter (Signed)
SW pt and pts wife, advised them to find another local pharmacy to fill this Rx. We would like to avoid changing pts BP medication if at all possible. She voiced understanding and will call back if she is unable to have Rx transferred.

## 2018-08-05 NOTE — Telephone Encounter (Signed)
Forwarding message to Dr.McGowen clinical team. Thank you.

## 2018-08-13 ENCOUNTER — Encounter: Payer: Medicare Other | Admitting: Family Medicine

## 2018-08-13 ENCOUNTER — Other Ambulatory Visit: Payer: Self-pay

## 2018-08-13 ENCOUNTER — Ambulatory Visit: Payer: Medicare Other | Admitting: Family Medicine

## 2018-08-13 NOTE — Progress Notes (Signed)
OFFICE VISIT  08/13/2018   CC: No chief complaint on file.   HPI:    Patient is a 68 y.o. Caucasian male with whom I am doing a telephone visit today (due to COVID-19 pandemic restrictions).  Prior to proceeding with our visit today, the telephone visit process was explained  and pt gave consent to proceed and understood that this process would be used to assess and treat the patient.  I last saw him 6 mo ago for HTN and HLD and obesity.   Chemistries and FLP were normal/excellent at that time. No changes were made to his medical mgmt.  Interim hx:  On 02/19/18, he underwent Cystoscopy with bladder biopsy and fulguration 2 to 5 cm; UroLift prostatic urethral implant with placement of 6 implants--by Dr. Junious Silk.  Past Medical History:  Diagnosis Date   BPH with obstruction/lower urinary tract symptoms    +Hx of acute urinary retention/acute cystitis: responding fairly well to tamsulosin and finasteride as of 09/2016 urol f/u.  Urolift and bladd bx-->doing great as of 05/2018 urol f/u.   Deafness in right ear    Gross hematuria    hematuria protocol w/u being done by urol as of their 01/23/18 note.   History of adenomatous polyp of colon    Dr. Oletta Lamas: last colonoscopy was 2017 and was normal.   5 yr recall.   History of back injury    Back fracture fell from tree   History of fracture of right ankle 05/2015   History of staph infection    after back surgery   HTN (hypertension)    Hyperlipidemia    Low back pain    Nephrolithiasis 2015   CT showed 2cm left sided stone which was stable since 2006 imaging and likely in renal parenchyma.  2.5 cm as of 08/2017 neph f/u: renal u/s 12/2017-->no hydro. KUB showed stable L sided 18 mm stone. Cystoscopy 12/2017 erythematous mucosa at dome, no mass.    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  Annotation: previous methicillin sensitive  staphylococcus aureus 2006  Qualifier: Diagnosis of  By: Johnnye Sima MD, Dellis Filbert  (ID Specialist)     Prostate cancer screening    via Urol--> PSA 1.39 April 2019.   Tinnitus, left ear     Past Surgical History:  Procedure Laterality Date   COLONOSCOPY  10/12/2015   repeat 5 years   COLONOSCOPY W/ POLYPECTOMY  2007    Negative 2012 and 2017; Dr Ollen Barges 2022.   CYSTOSCOPY     with bladder biopsy:  dome of bladder with an area of chronic inflammation.  No malignancy.   CYSTOSCOPY WITH BIOPSY N/A 02/19/2018   Procedure: CYSTOSCOPY WITH BIOPSY/ FULGURATION / UROLIFT;  Surgeon: Festus Aloe, MD;  Location: [facility];  Service: Urology;  Laterality: N/A;   LUMBAR LAMINECTOMY     Dr Vertell Limber performed 2nd & 3rd procedures (pt also has hx of fall and vertebral fracture years prior to his surgeries.   TONSILLECTOMY      Outpatient Medications Prior to Visit  Medication Sig Dispense Refill   Ascorbic Acid (VITAMIN C) 1000 MG tablet Take 1,000 mg by mouth daily.     aspirin 81 MG tablet Take 81 mg by mouth daily.      atorvastatin (LIPITOR) 20 MG tablet Take 1 tablet (20 mg total) by mouth daily. 90 tablet 1   finasteride (PROSCAR) 5 MG tablet Take 1 tablet (  5 mg total) by mouth daily. 90 tablet 0   losartan (COZAAR) 50 MG tablet TAKE 1 TABLET BY MOUTH DAILY 90 tablet 0   meloxicam (MOBIC) 15 MG tablet TAKE 1 TABLET BY MOUTH DAILY AS NEEDED FOR MUSCULOSKELETAL PAIN AS DIRECTED 90 tablet 0   metoprolol succinate (TOPROL-XL) 25 MG 24 hr tablet Take 1 tablet (25 mg total) by mouth daily. 90 tablet 0   tamsulosin (FLOMAX) 0.4 MG CAPS capsule Take 1 capsule (0.4 mg total) by mouth daily. 90 capsule 0   No facility-administered medications prior to visit.     No Known Allergies  ROS As per HPI  PE: There were no vitals taken for this visit.   LABS:    Chemistry      Component Value Date/Time   NA 139 02/13/2018 0820   K 4.8 02/13/2018 0820   CL 105 02/13/2018 0820   CO2 26 02/13/2018 0820   BUN 20  02/13/2018 0820   CREATININE 1.00 02/13/2018 0820   CREATININE 1.12 01/01/2013 1604      Component Value Date/Time   CALCIUM 8.7 02/13/2018 0820   ALKPHOS 71 02/13/2018 0820   AST 13 02/13/2018 0820   ALT 15 02/13/2018 0820   BILITOT 0.8 02/13/2018 0820     Lab Results  Component Value Date   CHOL 119 02/13/2018   HDL 43.40 02/13/2018   LDLCALC 51 02/13/2018   LDLDIRECT 133.0 06/26/2016   TRIG 124.0 02/13/2018   CHOLHDL 3 02/13/2018    IMPRESSION AND PLAN:  No problem-specific Assessment & Plan notes found for this encounter.   An After Visit Summary was printed and given to the patient.  FOLLOW UP: No follow-ups on file.  Signed:  Crissie Sickles, MD           08/13/2018

## 2018-09-17 ENCOUNTER — Ambulatory Visit: Payer: Medicare Other | Admitting: Family Medicine

## 2018-09-24 ENCOUNTER — Other Ambulatory Visit: Payer: Self-pay | Admitting: Family Medicine

## 2018-09-24 NOTE — Telephone Encounter (Signed)
RF request for Meloxicam LOV: 08/13/18 Next ov: 10/22/18 Last written: 07/02/18 (90,0)   Called pt and he stated he had enough for 8-9 more days. Medication pending. Please advise, thanks.

## 2018-10-08 ENCOUNTER — Other Ambulatory Visit: Payer: Self-pay | Admitting: Family Medicine

## 2018-10-22 ENCOUNTER — Ambulatory Visit: Payer: Medicare Other | Admitting: Family Medicine

## 2018-11-02 ENCOUNTER — Other Ambulatory Visit: Payer: Self-pay | Admitting: Family Medicine

## 2018-11-28 ENCOUNTER — Telehealth: Payer: Self-pay

## 2018-11-28 DIAGNOSIS — E7800 Pure hypercholesterolemia, unspecified: Secondary | ICD-10-CM

## 2018-11-28 DIAGNOSIS — D751 Secondary polycythemia: Secondary | ICD-10-CM

## 2018-11-28 DIAGNOSIS — I10 Essential (primary) hypertension: Secondary | ICD-10-CM

## 2018-11-28 NOTE — Telephone Encounter (Signed)
OK, labs ordered.

## 2018-11-28 NOTE — Telephone Encounter (Signed)
Copied from [place] [phone]. Topic: Appointment Scheduling - Scheduling Inquiry for Clinic >> Nov 28, 2018  8:30 AM Derrick [name] wrote: Reason for CRM: Pt would like to schedule his lab work to be done before his appt day. Pt needs orders for labs/ please advise

## 2018-11-28 NOTE — Telephone Encounter (Signed)
Spoke with patient, he would prefer to have labs done early to be able to discuss results during his physical.  Please advise, thanks.

## 2018-11-28 NOTE — Addendum Note (Signed)
Addended by: [name] on: 11/28/2018 06:19 PM   Modules accepted: Orders

## 2018-11-29 NOTE — Telephone Encounter (Signed)
LM for pt to return call to schedule lab appt few days prior to physical.

## 2018-11-29 NOTE — Telephone Encounter (Signed)
Pt left VM returning call for lab appt. Pt was contacted and scheduling for fasting labs.

## 2018-12-06 ENCOUNTER — Other Ambulatory Visit: Payer: Self-pay

## 2018-12-06 MED ORDER — METOPROLOL SUCCINATE ER 25 MG PO TB24: 25.0000 mg | ORAL_TABLET | Freq: Every day | ORAL | 1 refills | Status: DC

## 2018-12-23 ENCOUNTER — Ambulatory Visit (INDEPENDENT_AMBULATORY_CARE_PROVIDER_SITE_OTHER): Payer: Medicare Other | Admitting: Family Medicine

## 2018-12-23 ENCOUNTER — Other Ambulatory Visit: Payer: Self-pay

## 2018-12-23 DIAGNOSIS — D751 Secondary polycythemia: Secondary | ICD-10-CM | POA: Diagnosis not present

## 2018-12-23 DIAGNOSIS — E7800 Pure hypercholesterolemia, unspecified: Secondary | ICD-10-CM

## 2018-12-23 DIAGNOSIS — I10 Essential (primary) hypertension: Secondary | ICD-10-CM | POA: Diagnosis not present

## 2018-12-23 LAB — COMPREHENSIVE METABOLIC PANEL
ALT: 22 U/L (ref 0–53)
AST: 17 U/L (ref 0–37)
Albumin: 4.2 g/dL (ref 3.5–5.2)
Alkaline Phosphatase: 73 U/L (ref 39–117)
BUN: 19 mg/dL (ref 6–23)
CO2: 27 mEq/L (ref 19–32)
Calcium: 9.1 mg/dL (ref 8.4–10.5)
Chloride: 104 mEq/L (ref 96–112)
Creatinine, Ser: 0.97 mg/dL (ref 0.40–1.50)
GFR: 76.47 mL/min (ref >60.00)
Glucose, Bld: 98 mg/dL (ref 70–99)
Potassium: 4.6 mEq/L (ref 3.5–5.1)
Sodium: 140 mEq/L (ref 135–145)
Total Bilirubin: 0.6 mg/dL (ref 0.2–1.2)
Total Protein: 6.9 g/dL (ref 6.0–8.3)

## 2018-12-23 LAB — CBC WITH DIFFERENTIAL/PLATELET
Basophils Absolute: 0 10*3/uL (ref 0.0–0.1)
Basophils Relative: 0.6 % (ref 0.0–3.0)
Eosinophils Absolute: 0.2 10*3/uL (ref 0.0–0.7)
Eosinophils Relative: 3.1 % (ref 0.0–5.0)
HCT: 52.7 % — ABNORMAL HIGH (ref 39.0–52.0)
Hemoglobin: 17.7 g/dL — ABNORMAL HIGH (ref 13.0–17.0)
Lymphocytes Relative: 29.8 % (ref 12.0–46.0)
Lymphs Abs: 2.3 10*3/uL (ref 0.7–4.0)
MCHC: 33.5 g/dL (ref 30.0–36.0)
MCV: 91.4 fl (ref 78.0–100.0)
Monocytes Absolute: 0.9 10*3/uL (ref 0.1–1.0)
Monocytes Relative: 11.3 % (ref 3.0–12.0)
Neutro Abs: 4.3 10*3/uL (ref 1.4–7.7)
Neutrophils Relative %: 55.2 % (ref 43.0–77.0)
Platelets: 173 10*3/uL (ref 150.0–400.0)
RBC: 5.77 Mil/uL (ref 4.22–5.81)
RDW: 15.1 % (ref 11.5–15.5)
WBC: 7.8 10*3/uL (ref 4.0–10.5)

## 2018-12-23 LAB — LIPID PANEL
Cholesterol: 124 mg/dL (ref 0–200)
HDL: 47.2 mg/dL (ref >39.00)
LDL Cholesterol: 57 mg/dL (ref 0–99)
NonHDL: 76.6
Total CHOL/HDL Ratio: 3
Triglycerides: 98 mg/dL (ref 0.0–149.0)
VLDL: 19.6 mg/dL (ref 0.0–40.0)

## 2018-12-27 ENCOUNTER — Other Ambulatory Visit: Payer: Self-pay | Admitting: Family Medicine

## 2019-01-02 ENCOUNTER — Other Ambulatory Visit: Payer: Self-pay | Admitting: Family Medicine

## 2019-01-02 ENCOUNTER — Encounter: Payer: Medicare Other | Admitting: Family Medicine

## 2019-01-06 ENCOUNTER — Other Ambulatory Visit: Payer: Self-pay

## 2019-01-06 MED ORDER — TAMSULOSIN HCL 0.4 MG PO CAPS: 0.4000 mg | ORAL_CAPSULE | Freq: Every day | ORAL | 0 refills | Status: DC

## 2019-01-21 ENCOUNTER — Other Ambulatory Visit: Payer: Self-pay | Admitting: Family Medicine

## 2019-01-22 NOTE — Telephone Encounter (Signed)
Spoke with patient. He has been cutting Losartan 100 mg tablets in half to take 50 mg daily. Refill sent. [facility], depending on who has the product.

## 2019-01-23 ENCOUNTER — Ambulatory Visit (INDEPENDENT_AMBULATORY_CARE_PROVIDER_SITE_OTHER): Payer: Medicare Other | Admitting: Family Medicine

## 2019-01-23 ENCOUNTER — Other Ambulatory Visit (INDEPENDENT_AMBULATORY_CARE_PROVIDER_SITE_OTHER): Payer: Medicare Other

## 2019-01-23 ENCOUNTER — Encounter: Payer: Self-pay | Admitting: Family Medicine

## 2019-01-23 ENCOUNTER — Ambulatory Visit: Payer: Medicare Other | Admitting: Family Medicine

## 2019-01-23 ENCOUNTER — Other Ambulatory Visit: Payer: Self-pay

## 2019-01-23 VITALS — BP 135/84 | HR 82 | Temp 98.3°F | Resp 16 | Ht 68.0 in | Wt 254.6 lb

## 2019-01-23 DIAGNOSIS — I10 Essential (primary) hypertension: Secondary | ICD-10-CM | POA: Diagnosis not present

## 2019-01-23 DIAGNOSIS — E7800 Pure hypercholesterolemia, unspecified: Secondary | ICD-10-CM

## 2019-01-23 DIAGNOSIS — Z23 Encounter for immunization: Secondary | ICD-10-CM

## 2019-01-23 DIAGNOSIS — Z0000 Encounter for general adult medical examination without abnormal findings: Secondary | ICD-10-CM

## 2019-01-23 MED ORDER — LOSARTAN POTASSIUM 100 MG PO TABS: 100.0000 mg | ORAL_TABLET | Freq: Every day | ORAL | 1 refills | Status: DC

## 2019-01-23 NOTE — Patient Instructions (Signed)
Start taking a WHOLE losartan 100 mg tab every day. Check blood pressure and heart rate daily or every other day and write numbers down. Bring numbers in to review with me in 1 month.

## 2019-01-23 NOTE — Progress Notes (Signed)
OFFICE VISIT  01/23/2019   CC:  Chief Complaint  Patient presents with  . Follow-up    RCI, pt is fasting    HPI:    Patient is a 68 y.o. Caucasian male who presents for 6 mo f/u HTN, HLD  Reviewed all recent labs done 12/23/18 in detail today.  All are normal except a mild polycythemia that has been present and stable for several years.  HTN: home bp's 150s sometimes, sounds like consistently around 140/90 or so.  Has been checking it more the last 1 week or so. Not exercising the last year or so.  No consistent problem with LE swelling.  HLD: recent chol levels good.  Tolerating statin.  Diet is fair.  ROS: no CP, no SOB, no wheezing, no cough, no dizziness, no HAs, no rashes, no melena/hematochezia.  No polyuria or polydipsia.  No myalgias or arthralgias.  Past Medical History:  Diagnosis Date  . BPH with obstruction/lower urinary tract symptoms    +Hx of acute urinary retention/acute cystitis: responding fairly well to tamsulosin and finasteride as of 09/2016 urol f/u.  Urolift and bladd bx-->doing great as of 05/2018 urol f/u.  [name] Deafness in right ear   . Gross hematuria    hematuria protocol w/u being done by urol as of their 01/23/18 note.  [name] History of adenomatous polyp of colon    Dr. Oletta Lamas: last colonoscopy was 2017 and was normal.   5 yr recall.  [name] History of back injury    Back fracture fell from tree  . History of fracture of right ankle 05/2015  . History of staph infection    after back surgery  . HTN (hypertension)   . Hyperlipidemia   . Low back pain   . Nephrolithiasis 2015   CT showed 2cm left sided stone which was stable since 2006 imaging and likely in renal parenchyma.  2.5 cm as of 08/2017 neph f/u: renal u/s 12/2017-->no hydro. KUB showed stable L sided 18 mm stone. Cystoscopy 12/2017 erythematous mucosa at dome, no mass.   . Obesity, Class II, BMI 35-39.9   . OSTEOMYELITIS, CHRONIC 02/21/2006   Staph infection s/p lumbar surgery (surgery x 3).  Annotation:  previous methicillin sensitive  staphylococcus aureus 2006 Qualifier: Diagnosis of  By: Johnnye Sima MD, Dellis Filbert  (ID Specialist)    . Prostate cancer screening    via Urol--> PSA 1.39 April 2019.  [name] Tinnitus, left ear     Past Surgical History:  Procedure Laterality Date  . COLONOSCOPY  10/12/2015   repeat 5 years  . COLONOSCOPY W/ POLYPECTOMY  2007    Negative 2012 and 2017; Dr Ollen Barges 2022.  . CYSTOSCOPY     with bladder biopsy:  dome of bladder with an area of chronic inflammation.  No malignancy.  . CYSTOSCOPY WITH BIOPSY N/A 02/19/2018   Procedure: CYSTOSCOPY WITH BIOPSY/ FULGURATION / UROLIFT;  Surgeon: Festus Aloe, MD;  Location: [facility];  Service: Urology;  Laterality: N/A;  . LUMBAR LAMINECTOMY     Dr Vertell Limber performed 2nd & 3rd procedures (pt also has hx of fall and vertebral fracture years prior to his surgeries.  . TONSILLECTOMY      Outpatient Medications Prior to Visit  Medication Sig Dispense Refill  . Ascorbic Acid (VITAMIN C) 1000 MG tablet Take 1,000 mg by mouth daily.    [name] aspirin 81 MG tablet Take 81 mg by mouth daily.     [name] atorvastatin (LIPITOR) 20 MG tablet TAKE 1 TABLET BY  MOUTH EVERY DAY 90 tablet 1  . finasteride (PROSCAR) 5 MG tablet TAKE 1 TABLET BY MOUTH EVERY DAY 30 tablet 1  . meloxicam (MOBIC) 15 MG tablet TAKE 1 TABLET BY MOUTH DAILY AS NEEDED FOR MUSCULOSKELETAL PAIN AS DIRECTED 90 tablet 1  . metoprolol succinate (TOPROL-XL) 25 MG 24 hr tablet Take 1 tablet (25 mg total) by mouth daily. 30 tablet 1  . tamsulosin (FLOMAX) 0.4 MG CAPS capsule Take 1 capsule (0.4 mg total) by mouth daily. 30 capsule 0  . losartan (COZAAR) 100 MG tablet TAKE 1/2 TABLET (50MG ) BY MOUTH EVERY DAY 45 tablet 1  . losartan (COZAAR) 50 MG tablet TAKE 1 TABLET BY MOUTH DAILY (Patient not taking: Reported on 01/23/2019) 90 tablet 0   No facility-administered medications prior to visit.     No Known Allergies  ROS As per HPI  PE: Repeat bp today manually at end of  visit was 154/88 Blood pressure 135/84, pulse 82, temperature 98.3 F (36.8 C), temperature source Temporal, resp. rate 16, height 5\' 8"  (1.727 m), weight 254 lb 9.6 oz (115.5 kg), SpO2 95 %. Gen: Alert, well appearing.  Patient is oriented to person, place, time, and situation. AFFECT: pleasant, lucid thought and speech. CV: RRR, no m/r/g.   LUNGS: CTA bilat, nonlabored resps, good aeration in all lung fields. EXT: no clubbing or cyanosis.  1+ bilat LL pitting edema, a little worse on R than L.    LABS:  Lab Results  Component Value Date   TSH 1.40 07/05/2015   Lab Results  Component Value Date   WBC 7.8 12/23/2018   HGB 17.7 (H) 12/23/2018   HCT 52.7 (H) 12/23/2018   MCV 91.4 12/23/2018   PLT 173.0 12/23/2018   Lab Results  Component Value Date   CREATININE 0.97 12/23/2018   BUN 19 12/23/2018   NA 140 12/23/2018   K 4.6 12/23/2018   CL 104 12/23/2018   CO2 27 12/23/2018   Lab Results  Component Value Date   ALT 22 12/23/2018   AST 17 12/23/2018   ALKPHOS 73 12/23/2018   BILITOT 0.6 12/23/2018   Lab Results  Component Value Date   CHOL 124 12/23/2018   Lab Results  Component Value Date   HDL 47.20 12/23/2018   Lab Results  Component Value Date   LDLCALC 57 12/23/2018   Lab Results  Component Value Date   TRIG 98.0 12/23/2018   Lab Results  Component Value Date   CHOLHDL 3 12/23/2018   Lab Results  Component Value Date   PSA 1.95 06/21/2011   PSA 1.91 06/21/2010   PSA 1.53 06/25/2008    IMPRESSION AND PLAN:  1) Uncontrolled HTN: increase losartan to 100mg  qd. Instructions: Check blood pressure and heart rate daily or every other day and write numbers down. Bring numbers in to review with me in 1 month.  2) HLD: tolerating statin, lipid levels good 1 mo ago, ast/alt normal 1 mo ago.  3) Preventative health: next screening colonoscopy due 2022. Prostate cancer screening via his urologist. Flu vaccine->given today.  Spent 30 min with pt  today, with >50% of this time spent in counseling and care coordination regarding the above problems.  An After Visit Summary was printed and given to the patient.  FOLLOW UP: Return in about 4 weeks (around 02/20/2019) for f/u HTN.  Signed:  Crissie Sickles, MD           01/23/2019

## 2019-01-29 ENCOUNTER — Other Ambulatory Visit: Payer: Self-pay | Admitting: Family Medicine

## 2019-02-24 NOTE — Progress Notes (Signed)
OFFICE VISIT  02/25/2019   CC:  Chief Complaint  Patient presents with  . Follow-up    hypertension, uncontrolled   HPI:    Patient is a 68 y.o. Caucasian male who presents for 1 mo f/u uncontrolled HTN. A/P for this problem as of last visit 1 mo ago was: "Uncontrolled HTN: increase losartan to 100mg  qd. Instructions: Check blood pressure and heart rate daily or every other day and write numbers down. Bring numbers in to review with me in 1 month."  Interim hx: Feeling fine. Reviewed home bp's done with wrist cuff: avg 150s/90s, avg HR 80. He is worried that his cuff is not accurate b/c of some wide discrepancy in measurements within a short period of time.  He is not checking bp in ideal manner.  Past Medical History:  Diagnosis Date  . BPH with obstruction/lower urinary tract symptoms    +Hx of acute urinary retention/acute cystitis: responding fairly well to tamsulosin and finasteride as of 09/2016 urol f/u.  Urolift and bladd bx-->doing great as of 05/2018 urol f/u.  [name] Deafness in right ear   . Gross hematuria    hematuria protocol w/u being done by urol as of their 01/23/18 note.  [name] History of adenomatous polyp of colon    Dr. Oletta Lamas: last colonoscopy was 2017 and was normal.   5 yr recall.  [name] History of back injury    Back fracture fell from tree  . History of fracture of right ankle 05/2015  . History of staph infection    after back surgery  . HTN (hypertension)   . Hyperlipidemia   . Low back pain   . Nephrolithiasis 2015   CT showed 2cm left sided stone which was stable since 2006 imaging and likely in renal parenchyma.  2.5 cm as of 08/2017 neph f/u: renal u/s 12/2017-->no hydro. KUB showed stable L sided 18 mm stone. Cystoscopy 12/2017 erythematous mucosa at dome, no mass.   . Obesity, Class II, BMI 35-39.9   . OSTEOMYELITIS, CHRONIC 02/21/2006   Staph infection s/p lumbar surgery (surgery x 3).  Annotation: previous methicillin sensitive  staphylococcus aureus 2006  Qualifier: Diagnosis of  By: Johnnye Sima MD, Dellis Filbert  (ID Specialist)    . Prostate cancer screening    via Urol--> PSA 1.39 April 2019.  [name] Tinnitus, left ear     Past Surgical History:  Procedure Laterality Date  . COLONOSCOPY  10/12/2015   repeat 5 years  . COLONOSCOPY W/ POLYPECTOMY  2007    Negative 2012 and 2017; Dr Ollen Barges 2022.  . CYSTOSCOPY     with bladder biopsy:  dome of bladder with an area of chronic inflammation.  No malignancy.  . CYSTOSCOPY WITH BIOPSY N/A 02/19/2018   Procedure: CYSTOSCOPY WITH BIOPSY/ FULGURATION / UROLIFT;  Surgeon: Festus Aloe, MD;  Location: [facility];  Service: Urology;  Laterality: N/A;  . LUMBAR LAMINECTOMY     Dr Vertell Limber performed 2nd & 3rd procedures (pt also has hx of fall and vertebral fracture years prior to his surgeries.  . TONSILLECTOMY      Outpatient Medications Prior to Visit  Medication Sig Dispense Refill  . Ascorbic Acid (VITAMIN C) 1000 MG tablet Take 1,000 mg by mouth daily.    [name] atorvastatin (LIPITOR) 20 MG tablet TAKE 1 TABLET BY MOUTH EVERY DAY 90 tablet 1  . finasteride (PROSCAR) 5 MG tablet TAKE 1 TABLET BY MOUTH EVERY DAY 30 tablet 1  . losartan (COZAAR) 100 MG tablet Take 1  tablet (100 mg total) by mouth daily. 90 tablet 1  . meloxicam (MOBIC) 15 MG tablet TAKE 1 TABLET BY MOUTH DAILY AS NEEDED FOR MUSCULOSKELETAL PAIN AS DIRECTED 90 tablet 1  . tamsulosin (FLOMAX) 0.4 MG CAPS capsule TAKE 1 CAPSULE BY MOUTH EVERY DAY 30 capsule 5  . aspirin 81 MG tablet Take 81 mg by mouth daily.     . metoprolol succinate (TOPROL-XL) 25 MG 24 hr tablet Take 1 tablet (25 mg total) by mouth daily. 30 tablet 1   No facility-administered medications prior to visit.     No Known Allergies  ROS As per HPI  PE: Blood pressure 132/82, pulse 78, temperature 98.1 F (36.7 C), temperature source Temporal, resp. rate 16, height 5\' 8"  (1.727 m), weight 252 lb 12.8 oz (114.7 kg), SpO2 98 %. Body mass index is 38.44 kg/m.  Gen: Alert,  well appearing.  Patient is oriented to person, place, time, and situation. AFFECT: pleasant, lucid thought and speech.  No edema. No further exam today.  LABS:    Chemistry      Component Value Date/Time   NA 140 12/23/2018 0855   K 4.6 12/23/2018 0855   CL 104 12/23/2018 0855   CO2 27 12/23/2018 0855   BUN 19 12/23/2018 0855   CREATININE 0.97 12/23/2018 0855   CREATININE 1.12 01/01/2013 1604      Component Value Date/Time   CALCIUM 9.1 12/23/2018 0855   ALKPHOS 73 12/23/2018 0855   AST 17 12/23/2018 0855   ALT 22 12/23/2018 0855   BILITOT 0.6 12/23/2018 0855      IMPRESSION AND PLAN:  1) Uncontrolled HTN: home bp measurments with wrist cuff quite a bit worse than our reading here. I think he could still use a bit more med: increase toprol xl to 50 mg qd. Continue losartan 100 mg qd. Buy new UPPER ARM bp cuff and check bp every day (I explained checking 2 bp's 5 min apart and taking the average of these numbers. Check HR as well.) Goal <130/80 consistently.  Discussed latest research indicating LACK of good evidence for using ASA for primary CV dz prevention, and risk of GI/major bleeding goes up after age 5-->we decided to d/c his ASA today. I made it clear that ASA is still indicated for SECONDARY prevention of CV dz.  An After Visit Summary was printed and given to the patient.  FOLLOW UP: Return in about 4 weeks (around 03/25/2019) for f/u HTN.  Signed:  Crissie Sickles, MD           02/25/2019

## 2019-02-25 ENCOUNTER — Other Ambulatory Visit: Payer: Self-pay

## 2019-02-25 ENCOUNTER — Encounter: Payer: Self-pay | Admitting: Family Medicine

## 2019-02-25 ENCOUNTER — Ambulatory Visit (INDEPENDENT_AMBULATORY_CARE_PROVIDER_SITE_OTHER): Payer: Medicare Other | Admitting: Family Medicine

## 2019-02-25 VITALS — BP 132/82 | HR 78 | Temp 98.1°F | Resp 16 | Ht 68.0 in | Wt 252.8 lb

## 2019-02-25 DIAGNOSIS — I10 Essential (primary) hypertension: Secondary | ICD-10-CM

## 2019-02-25 MED ORDER — METOPROLOL SUCCINATE ER 50 MG PO TB24: 50.0000 mg | ORAL_TABLET | Freq: Every day | ORAL | 1 refills | Status: DC

## 2019-03-05 ENCOUNTER — Telehealth: Payer: Self-pay | Admitting: Family Medicine

## 2019-03-05 NOTE — Telephone Encounter (Signed)
Patient reports his pulse has been going up. Change in bp meds - was not sure if it was because of meds. He scheduled follow up appt 03/26/19  Please advise. Please contact patient [phone]

## 2019-03-05 NOTE — Telephone Encounter (Signed)
Spoke with patient and his HR has been fine ranging from 62-82 except for last night. It was 114 and BP 135/78. His BP have been ranging from [id number]. He has only had that one high HR. The only change in med was increasing Losartan from 50 to 100mg  qd. His last ov was 02/25/19 and f/u appt 03/26/19  Please advise if any recommendations, thanks.

## 2019-03-05 NOTE — Telephone Encounter (Signed)
No changes need to be made. Call if elevated HR's become a more regular-occurring thing for him.

## 2019-03-06 NOTE — Telephone Encounter (Signed)
Patient advised and voiced understanding.  

## 2019-03-19 ENCOUNTER — Other Ambulatory Visit: Payer: Self-pay | Admitting: Family Medicine

## 2019-03-20 ENCOUNTER — Other Ambulatory Visit: Payer: Self-pay

## 2019-03-20 DIAGNOSIS — Z20828 Contact with and (suspected) exposure to other viral communicable diseases: Secondary | ICD-10-CM | POA: Diagnosis not present

## 2019-03-20 DIAGNOSIS — Z20822 Contact with and (suspected) exposure to covid-19: Secondary | ICD-10-CM

## 2019-03-21 LAB — NOVEL CORONAVIRUS, NAA: SARS-CoV-2, NAA: NOT DETECTED

## 2019-03-26 ENCOUNTER — Ambulatory Visit: Payer: Medicare Other | Admitting: Family Medicine

## 2019-03-26 ENCOUNTER — Ambulatory Visit (INDEPENDENT_AMBULATORY_CARE_PROVIDER_SITE_OTHER): Payer: Medicare Other | Admitting: Family Medicine

## 2019-03-26 ENCOUNTER — Other Ambulatory Visit: Payer: Self-pay

## 2019-03-26 ENCOUNTER — Encounter: Payer: Self-pay | Admitting: Family Medicine

## 2019-03-26 VITALS — BP 108/72 | HR 73 | Temp 98.0°F | Resp 16 | Ht 68.0 in | Wt 254.2 lb

## 2019-03-26 DIAGNOSIS — Z0000 Encounter for general adult medical examination without abnormal findings: Secondary | ICD-10-CM | POA: Diagnosis not present

## 2019-03-26 DIAGNOSIS — I10 Essential (primary) hypertension: Secondary | ICD-10-CM | POA: Diagnosis not present

## 2019-03-26 MED ORDER — ZOSTER VAC RECOMB ADJUVANTED 50 MCG/0.5ML IM SUSR: 0.5000 mL | Freq: Once | INTRAMUSCULAR | 0 refills | Status: AC

## 2019-03-26 NOTE — Progress Notes (Signed)
OFFICE VISIT  03/26/2019   CC:  Chief Complaint  Patient presents with  . Follow-up    uncontrolled hypertension   HPI:    Patient is a 68 y.o. Caucasian male who presents for 1 mo f/u uncontrolled HTN. A/P as of last visit: "Uncontrolled HTN: home bp measurments with wrist cuff quite a bit worse than our reading here. I think he could still use a bit more med: increase toprol xl to 50 mg qd. Continue losartan 100 mg qd. Buy new UPPER ARM bp cuff and check bp every day (I explained checking 2 bp's 5 min apart and taking the average of these numbers. Check HR as well.) Goal <130/80 consistently."  Interim hx: Home measurements 130/70, HR 60s. No sx's from inc dose of metoprolol.  Past Medical History:  Diagnosis Date  . BPH with obstruction/lower urinary tract symptoms    +Hx of acute urinary retention/acute cystitis: responding fairly well to tamsulosin and finasteride as of 09/2016 urol f/u.  Urolift and bladd bx-->doing great as of 05/2018 urol f/u.  [name] Deafness in right ear   . Gross hematuria    hematuria protocol w/u being done by urol as of their 01/23/18 note.  [name] History of adenomatous polyp of colon    Dr. Oletta Lamas: last colonoscopy was 2017 and was normal.   5 yr recall.  [name] History of back injury    Back fracture fell from tree  . History of fracture of right ankle 05/2015  . History of staph infection    after back surgery  . HTN (hypertension)   . Hyperlipidemia   . Low back pain   . Nephrolithiasis 2015   CT showed 2cm left sided stone which was stable since 2006 imaging and likely in renal parenchyma.  2.5 cm as of 08/2017 neph f/u: renal u/s 12/2017-->no hydro. KUB showed stable L sided 18 mm stone. Cystoscopy 12/2017 erythematous mucosa at dome, no mass.   . Obesity, Class II, BMI 35-39.9   . OSTEOMYELITIS, CHRONIC 02/21/2006   Staph infection s/p lumbar surgery (surgery x 3).  Annotation: previous methicillin sensitive  staphylococcus aureus 2006 Qualifier: Diagnosis  of  By: Johnnye Sima MD, Dellis Filbert  (ID Specialist)    . Prostate cancer screening    via Urol--> PSA 1.39 April 2019.  [name] Tinnitus, left ear     Past Surgical History:  Procedure Laterality Date  . COLONOSCOPY  10/12/2015   repeat 5 years  . COLONOSCOPY W/ POLYPECTOMY  2007    Negative 2012 and 2017; Dr Ollen Barges 2022.  . CYSTOSCOPY     with bladder biopsy:  dome of bladder with an area of chronic inflammation.  No malignancy.  . CYSTOSCOPY WITH BIOPSY N/A 02/19/2018   Procedure: CYSTOSCOPY WITH BIOPSY/ FULGURATION / UROLIFT;  Surgeon: Festus Aloe, MD;  Location: [facility];  Service: Urology;  Laterality: N/A;  . LUMBAR LAMINECTOMY     Dr Vertell Limber performed 2nd & 3rd procedures (pt also has hx of fall and vertebral fracture years prior to his surgeries.  . TONSILLECTOMY      Outpatient Medications Prior to Visit  Medication Sig Dispense Refill  . Ascorbic Acid (VITAMIN C) 1000 MG tablet Take 1,000 mg by mouth daily.    [name] atorvastatin (LIPITOR) 20 MG tablet TAKE 1 TABLET BY MOUTH EVERY DAY 90 tablet 1  . finasteride (PROSCAR) 5 MG tablet TAKE 1 TABLET BY MOUTH EVERY DAY 30 tablet 1  . losartan (COZAAR) 100 MG tablet Take 1 tablet (100  mg total) by mouth daily. 90 tablet 1  . meloxicam (MOBIC) 15 MG tablet TAKE 1 TABLET BY MOUTH DAILY AS NEEDED FOR MUSCULOSKELETAL PAIN AS DIRECTED 90 tablet 1  . metoprolol succinate (TOPROL-XL) 50 MG 24 hr tablet Take 1 tablet (50 mg total) by mouth daily. Take with or immediately following a meal. 30 tablet 1  . tamsulosin (FLOMAX) 0.4 MG CAPS capsule TAKE 1 CAPSULE BY MOUTH EVERY DAY 30 capsule 5   No facility-administered medications prior to visit.     No Known Allergies  ROS As per HPI  PE: Blood pressure 108/72, pulse 73, temperature 98 F (36.7 C), temperature source Temporal, resp. rate 16, height 5\' 8"  (1.727 m), weight 254 lb 3.2 oz (115.3 kg), SpO2 96 %. Body mass index is 38.65 kg/m.  Gen: Alert, well appearing.  Patient is  oriented to person, place, time, and situation. AFFECT: pleasant, lucid thought and speech. CV: RRR, no m/r/g.   LUNGS: CTA bilat, nonlabored resps, good aeration in all lung fields. EXT: no clubbing or cyanosis.  no edema.    LABS:    Chemistry      Component Value Date/Time   NA 140 12/23/2018 0855   K 4.6 12/23/2018 0855   CL 104 12/23/2018 0855   CO2 27 12/23/2018 0855   BUN 19 12/23/2018 0855   CREATININE 0.97 12/23/2018 0855   CREATININE 1.12 01/01/2013 1604      Component Value Date/Time   CALCIUM 9.1 12/23/2018 0855   ALKPHOS 73 12/23/2018 0855   AST 17 12/23/2018 0855   ALT 22 12/23/2018 0855   BILITOT 0.6 12/23/2018 0855     Lab Results  Component Value Date   CHOL 124 12/23/2018   HDL 47.20 12/23/2018   LDLCALC 57 12/23/2018   LDLDIRECT 133.0 06/26/2016   TRIG 98.0 12/23/2018   CHOLHDL 3 12/23/2018     IMPRESSION AND PLAN:  1) HTN: well controlled now. No changes today. Last lytes/cr good 3 mo ago.  2) Prev health care:  Sent shingrix prescription to his pharmacy today.  An After Visit Summary was printed and given to the patient.  FOLLOW UP: Return in about 6 months (around 09/23/2019) for routine chronic illness f/u.This will be patient's "physical".  Signed:  Crissie Sickles, MD           03/26/2019

## 2019-03-27 ENCOUNTER — Other Ambulatory Visit: Payer: Self-pay | Admitting: Family Medicine

## 2019-03-27 NOTE — Telephone Encounter (Signed)
RF request for Meloxicam LOV: 03/26/19 Next ov: 09/23/19 Last written: 09/24/18 (90,1)  Please advise, thanks. Medication pending

## 2019-04-19 ENCOUNTER — Other Ambulatory Visit: Payer: Self-pay | Admitting: Family Medicine

## 2019-06-20 ENCOUNTER — Other Ambulatory Visit: Payer: Self-pay | Admitting: Family Medicine

## 2019-07-16 DIAGNOSIS — L819 Disorder of pigmentation, unspecified: Secondary | ICD-10-CM | POA: Diagnosis not present

## 2019-07-16 DIAGNOSIS — D485 Neoplasm of uncertain behavior of skin: Secondary | ICD-10-CM | POA: Diagnosis not present

## 2019-07-16 DIAGNOSIS — D1801 Hemangioma of skin and subcutaneous tissue: Secondary | ICD-10-CM | POA: Diagnosis not present

## 2019-07-16 DIAGNOSIS — L820 Inflamed seborrheic keratosis: Secondary | ICD-10-CM | POA: Diagnosis not present

## 2019-07-16 DIAGNOSIS — L814 Other melanin hyperpigmentation: Secondary | ICD-10-CM | POA: Diagnosis not present

## 2019-07-16 DIAGNOSIS — L821 Other seborrheic keratosis: Secondary | ICD-10-CM | POA: Diagnosis not present

## 2019-07-16 DIAGNOSIS — D229 Melanocytic nevi, unspecified: Secondary | ICD-10-CM | POA: Diagnosis not present

## 2019-07-30 DIAGNOSIS — R351 Nocturia: Secondary | ICD-10-CM | POA: Diagnosis not present

## 2019-07-30 DIAGNOSIS — N5201 Erectile dysfunction due to arterial insufficiency: Secondary | ICD-10-CM | POA: Diagnosis not present

## 2019-07-30 DIAGNOSIS — N401 Enlarged prostate with lower urinary tract symptoms: Secondary | ICD-10-CM | POA: Diagnosis not present

## 2019-07-30 DIAGNOSIS — N200 Calculus of kidney: Secondary | ICD-10-CM | POA: Diagnosis not present

## 2019-08-01 ENCOUNTER — Encounter: Payer: Self-pay | Admitting: Family Medicine

## 2019-08-13 ENCOUNTER — Other Ambulatory Visit: Payer: Self-pay | Admitting: Family Medicine

## 2019-08-25 ENCOUNTER — Encounter: Payer: Self-pay | Admitting: Family Medicine

## 2019-09-12 ENCOUNTER — Other Ambulatory Visit: Payer: Self-pay | Admitting: Family Medicine

## 2019-09-17 ENCOUNTER — Other Ambulatory Visit: Payer: Self-pay | Admitting: Family Medicine

## 2019-09-19 ENCOUNTER — Other Ambulatory Visit: Payer: Self-pay | Admitting: Family Medicine

## 2019-09-19 MED ORDER — FINASTERIDE 5 MG PO TABS: 5.0000 mg | ORAL_TABLET | Freq: Every day | ORAL | 3 refills | Status: DC

## 2019-09-19 NOTE — Telephone Encounter (Signed)
Received a RF request from the pharmacy for finasteride 5mg  tablets.  Last RF shows 11/04/2018 # 30, x 1 rf.  I couldn't see where he was to stop taking but there is a big gap.  Can you please advise if refill is appropriate?

## 2019-09-23 ENCOUNTER — Other Ambulatory Visit: Payer: Self-pay

## 2019-09-23 ENCOUNTER — Ambulatory Visit (INDEPENDENT_AMBULATORY_CARE_PROVIDER_SITE_OTHER): Payer: Medicare Other | Admitting: Family Medicine

## 2019-09-23 ENCOUNTER — Encounter: Payer: Self-pay | Admitting: Family Medicine

## 2019-09-23 VITALS — BP 125/74 | HR 75 | Temp 98.2°F | Resp 16 | Ht 66.5 in | Wt 260.0 lb

## 2019-09-23 DIAGNOSIS — E7800 Pure hypercholesterolemia, unspecified: Secondary | ICD-10-CM

## 2019-09-23 DIAGNOSIS — M5431 Sciatica, right side: Secondary | ICD-10-CM

## 2019-09-23 DIAGNOSIS — Z0000 Encounter for general adult medical examination without abnormal findings: Secondary | ICD-10-CM

## 2019-09-23 DIAGNOSIS — E669 Obesity, unspecified: Secondary | ICD-10-CM | POA: Diagnosis not present

## 2019-09-23 DIAGNOSIS — I10 Essential (primary) hypertension: Secondary | ICD-10-CM

## 2019-09-23 LAB — LIPID PANEL
Cholesterol: 126 mg/dL (ref 0–200)
HDL: 44 mg/dL (ref >39.00)
LDL Cholesterol: 65 mg/dL (ref 0–99)
NonHDL: 82.3
Total CHOL/HDL Ratio: 3
Triglycerides: 86 mg/dL (ref 0.0–149.0)
VLDL: 17.2 mg/dL (ref 0.0–40.0)

## 2019-09-23 LAB — COMPREHENSIVE METABOLIC PANEL
ALT: 17 U/L (ref 0–53)
AST: 14 U/L (ref 0–37)
Albumin: 4.1 g/dL (ref 3.5–5.2)
Alkaline Phosphatase: 70 U/L (ref 39–117)
BUN: 21 mg/dL (ref 6–23)
CO2: 30 mEq/L (ref 19–32)
Calcium: 8.8 mg/dL (ref 8.4–10.5)
Chloride: 106 mEq/L (ref 96–112)
Creatinine, Ser: 1.01 mg/dL (ref 0.40–1.50)
GFR: 72.83 mL/min (ref >60.00)
Glucose, Bld: 105 mg/dL — ABNORMAL HIGH (ref 70–99)
Potassium: 4.3 mEq/L (ref 3.5–5.1)
Sodium: 140 mEq/L (ref 135–145)
Total Bilirubin: 0.7 mg/dL (ref 0.2–1.2)
Total Protein: 6.6 g/dL (ref 6.0–8.3)

## 2019-09-23 LAB — CBC WITH DIFFERENTIAL/PLATELET
Basophils Absolute: 0.1 10*3/uL (ref 0.0–0.1)
Basophils Relative: 0.9 % (ref 0.0–3.0)
Eosinophils Absolute: 0.2 10*3/uL (ref 0.0–0.7)
Eosinophils Relative: 2.9 % (ref 0.0–5.0)
HCT: 51.1 % (ref 39.0–52.0)
Hemoglobin: 17 g/dL (ref 13.0–17.0)
Lymphocytes Relative: 24.3 % (ref 12.0–46.0)
Lymphs Abs: 1.6 10*3/uL (ref 0.7–4.0)
MCHC: 33.4 g/dL (ref 30.0–36.0)
MCV: 91.2 fl (ref 78.0–100.0)
Monocytes Absolute: 0.9 10*3/uL (ref 0.1–1.0)
Monocytes Relative: 13.4 % — ABNORMAL HIGH (ref 3.0–12.0)
Neutro Abs: 3.8 10*3/uL (ref 1.4–7.7)
Neutrophils Relative %: 58.5 % (ref 43.0–77.0)
Platelets: 166 10*3/uL (ref 150.0–400.0)
RBC: 5.61 Mil/uL (ref 4.22–5.81)
RDW: 15.1 % (ref 11.5–15.5)
WBC: 6.5 10*3/uL (ref 4.0–10.5)

## 2019-09-23 MED ORDER — PREDNISONE 20 MG PO TABS: ORAL_TABLET | ORAL | 0 refills | Status: DC

## 2019-09-23 MED ORDER — METOPROLOL SUCCINATE ER 50 MG PO TB24: 50.0000 mg | ORAL_TABLET | Freq: Every day | ORAL | 3 refills | Status: DC

## 2019-09-23 NOTE — Progress Notes (Signed)
OFFICE VISIT  09/23/2019   CC:  Chief Complaint  Patient presents with  . Annual Exam    pt is fasting     HPI:    Patient is a 69 y.o. Caucasian male who presents for 6 mo f/u HTN, HLD, class II obesity.  Having lots of prob lately with pain in R glut that radiates down R leg intermittently.  Onset about 1 yr ago, getting more frequent.  No back pain.  No leg weakness.  No leg tingling or numbness. No loss of b/b control.  No saddle anesthesia.  HTN:   HLD:   Class II obesity: he is active, walks some for exercise.     Past Medical History:  Diagnosis Date  . BPH with obstruction/lower urinary tract symptoms    +Hx of acute urinary retention/acute cystitis: responding fairly well to tamsulosin and finasteride as of 09/2016 urol f/u.  Urolift and bladd bx 02/2018-->doing great as of 07/2019 urol f/u.  [name] Deafness in right ear   . ED (erectile dysfunction)   . Gross hematuria    hematuria protocol w/u being done by urol as of their 01/23/18 note.  [name] History of adenomatous polyp of colon    Dr. Oletta Lamas: last colonoscopy was 2017 and was normal.   5 yr recall.  [name] History of back injury    Back fracture fell from tree  . History of fracture of right ankle 05/2015  . History of staph infection    after back surgery  . HTN (hypertension)   . Hyperlipidemia   . Low back pain   . Nephrolithiasis 2015   CT showed 2cm left sided stone which was stable since 2006 imaging and likely in renal parenchyma.  2.5 cm as of 08/2017 neph f/u: renal u/s 12/2017-->no hydro. KUB showed stable L sided 18 mm stone. Cystoscopy 12/2017 erythematous mucosa at dome, no mass.   . Obesity, Class II, BMI 35-39.9   . OSTEOMYELITIS, CHRONIC 02/21/2006   Staph infection s/p lumbar surgery (surgery x 3).  Annotation: previous methicillin sensitive  staphylococcus aureus 2006 Qualifier: Diagnosis of  By: Johnnye Sima MD, Dellis Filbert  (ID Specialist)    . Prostate cancer screening    via Urol--> PSA 1.39 April 2019. 1.36  Feb 2020.  [name] Tinnitus, left ear     Past Surgical History:  Procedure Laterality Date  . COLONOSCOPY  10/12/2015   repeat 5 years  . COLONOSCOPY W/ POLYPECTOMY  2007    Negative 2012 and 2017; Dr Ollen Barges 2022.  . CYSTOSCOPY     with bladder biopsy:  dome of bladder with an area of chronic inflammation.  No malignancy.  . CYSTOSCOPY WITH BIOPSY N/A 02/19/2018   Procedure: CYSTOSCOPY WITH BIOPSY/ FULGURATION / UROLIFT;  Surgeon: Festus Aloe, MD;  Location: [facility];  Service: Urology;  Laterality: N/A;  . LUMBAR LAMINECTOMY     Dr Vertell Limber performed 2nd & 3rd procedures (pt also has hx of fall and vertebral fracture years prior to his surgeries.  . TONSILLECTOMY      Outpatient Medications Prior to Visit  Medication Sig Dispense Refill  . Ascorbic Acid (VITAMIN C) 1000 MG tablet Take 1,000 mg by mouth daily.    [name] atorvastatin (LIPITOR) 20 MG tablet TAKE 1 TABLET BY MOUTH EVERY DAY 90 tablet 0  . finasteride (PROSCAR) 5 MG tablet Take 1 tablet (5 mg total) by mouth daily. 90 tablet 3  . losartan (COZAAR) 100 MG tablet Take 1 tablet (100 mg total) by  mouth daily. 90 tablet 0  . meloxicam (MOBIC) 15 MG tablet TAKE 1 TABLET BY MOUTH DAILY AS NEEDED FOR MUSCULOSKELETAL PAIN AS DIRECTED 90 tablet 1  . metoprolol succinate (TOPROL-XL) 50 MG 24 hr tablet TAKE 1 TABLET (50 MG TOTAL) BY MOUTH DAILY. TAKE WITH OR IMMEDIATELY FOLLOWING A MEAL. 90 tablet 1  . tamsulosin (FLOMAX) 0.4 MG CAPS capsule TAKE 1 CAPSULE BY MOUTH EVERY DAY 90 capsule 1   No facility-administered medications prior to visit.    No Known Allergies  ROS As per HPI  PE: Vitals with BMI 09/23/2019 03/26/2019 02/25/2019  Height 5' 6.5" 5\' 8"  5\' 8"   Weight 260 lbs 254 lbs 3 oz 252 lbs 13 oz  BMI 41.34 [id number]  Systolic [id number]  Diastolic 74 72 82  Pulse 75 73 78    Gen: Alert, well appearing.  Patient is oriented to person, place, time, and situation. AFFECT: pleasant, lucid thought and speech. CV: RRR,  no m/r/g.   LUNGS: CTA bilat, nonlabored resps, good aeration in all lung fields. ABD: soft, NT, ND, BS normal.  No hepatospenomegaly or mass.  No bruits. EXT: no clubbing or cyanosis.  1+ pitting bilat pretibial regions, w/out significant skin changes. Back: some mild pain with flexion/extension.  No LB tenderness, no gluteal or hip tenderness. ROM of hips intact bilat.  R hip strength 5/5 bilat. Supine SLR neg bilat.    LABS:  Lab Results  Component Value Date   TSH 1.40 07/05/2015   Lab Results  Component Value Date   WBC 7.8 12/23/2018   HGB 17.7 (H) 12/23/2018   HCT 52.7 (H) 12/23/2018   MCV 91.4 12/23/2018   PLT 173.0 12/23/2018   Lab Results  Component Value Date   CREATININE 0.97 12/23/2018   BUN 19 12/23/2018   NA 140 12/23/2018   K 4.6 12/23/2018   CL 104 12/23/2018   CO2 27 12/23/2018   Lab Results  Component Value Date   ALT 22 12/23/2018   AST 17 12/23/2018   ALKPHOS 73 12/23/2018   BILITOT 0.6 12/23/2018   Lab Results  Component Value Date   CHOL 124 12/23/2018   Lab Results  Component Value Date   HDL 47.20 12/23/2018   Lab Results  Component Value Date   LDLCALC 57 12/23/2018   Lab Results  Component Value Date   TRIG 98.0 12/23/2018   Lab Results  Component Value Date   CHOLHDL 3 12/23/2018   Lab Results  Component Value Date   PSA 1.36 07/04/2018   PSA 1.95 06/21/2011   PSA 1.91 06/21/2010   IMPRESSION AND PLAN:  1) HTN: well controlled. Lytes/cr today.  2) HLD: Tolerating statin. Last LDL 57 about 9 mo ago, hepatic panel normal at that time. FLP and hepatic panel today.  3) Obesity, class II: he's pretty active but has had to stop his swimming for the last 15 mo or so due to covid restrictions on pool access at the [facility]. Says he is working on diet some lately. His wt is up 6 lbs since I last saw him.  4) Sciatica on R: recommended PT but he declined. Ongoing stretching recommended.  Also decided to do trial of prednisone  40 mg qd x 5d.  5) Preventative health: next screening colonoscopy due 2022. Prostate cancer screening via his urologist. Tdap UTD.  Shingrix : he has had #1, will return to pharmacy for #2..   Covid 19 : he has had this vaccine  x 2.  An After Visit Summary was printed and given to the patient.  FOLLOW UP: No follow-ups on file.  Signed:  Crissie Sickles, MD           09/23/2019

## 2019-10-08 ENCOUNTER — Telehealth: Payer: Self-pay

## 2019-10-08 NOTE — Telephone Encounter (Signed)
In lots of pain with back pain, wife is patient of Dr. Raoul Pitch, will she see Mr. Bertucci tomorrow morning? Please let Hinton Dyer or Wells Guiles in the morning on Thursday?

## 2019-10-08 NOTE — Telephone Encounter (Signed)
Patients wife called in wanting to know if patient could be scheduled for appt for inflammation injection     Please advise

## 2019-10-08 NOTE — Telephone Encounter (Signed)
Patient's wife showed up in office thinking this would be something done as nurse visit. Advised this type of request would need an office visit and PCP is out this week and full schedule next week. She stated she would call to get an appt scheduled for him.

## 2019-10-08 NOTE — Telephone Encounter (Signed)
LM for pt to returncall

## 2019-10-09 ENCOUNTER — Encounter: Payer: Self-pay | Admitting: Family Medicine

## 2019-10-09 ENCOUNTER — Ambulatory Visit (INDEPENDENT_AMBULATORY_CARE_PROVIDER_SITE_OTHER): Payer: Medicare Other | Admitting: Family Medicine

## 2019-10-09 ENCOUNTER — Other Ambulatory Visit: Payer: Self-pay

## 2019-10-09 VITALS — BP 128/84 | HR 76 | Temp 98.1°F | Resp 18 | Ht 68.0 in | Wt 258.0 lb

## 2019-10-09 DIAGNOSIS — M5431 Sciatica, right side: Secondary | ICD-10-CM

## 2019-10-09 DIAGNOSIS — M25559 Pain in unspecified hip: Secondary | ICD-10-CM

## 2019-10-09 MED ORDER — DICLOFENAC SODIUM 75 MG PO TBEC: 75.0000 mg | DELAYED_RELEASE_TABLET | Freq: Two times a day (BID) | ORAL | 0 refills | Status: DC

## 2019-10-09 MED ORDER — DICLOFENAC SODIUM 75 MG PO TBEC: 75.0000 mg | DELAYED_RELEASE_TABLET | Freq: Two times a day (BID) | ORAL | 1 refills | Status: DC

## 2019-10-09 NOTE — Progress Notes (Signed)
This visit occurred during the SARS-CoV-2 public health emergency.  Safety protocols were in place, including screening questions prior to the visit, additional usage of staff PPE, and extensive cleaning of exam room while observing appropriate contact time as indicated for disinfecting solutions.    Derrick [name] , [birth year], 69 y.o., male MRN: [id number] Patient Care Team    Relationship Specialty Notifications Start End  McGowen, Adrian Blackwater, MD PCP - General Family Medicine  07/25/11   Festus Aloe, MD Consulting Physician Urology  08/17/11   Lyndal Pulley, DO Consulting Physician Sports Medicine  05/18/15   Laurence Spates, MD (Inactive) Consulting Physician Gastroenterology  10/21/15   Odette Fraction  Optometry  06/26/16     Chief Complaint  Patient presents with  . Sciatica    Pt has sciatic nerve pain that goes down patients leg.      Subjective: Pt presents for an OV with complaints of right sciatic pain of 2 weeks duration.  Patient has had recurrent right sciatic pain over the last year.  Reports over the last 2 weeks it has become rather intolerable.  He denies any increase in activity or injury prior to onset of pain.  He has a rather significant lumbar and thoracic pathology history with fusion/bar and pedicle screws.  Reviewed MRI from 2006 available in the system.  He reports he is done well overall since those surgeries and has remained rather active.  He seen his PCP for his physical a few weeks ago and was placed on a short course of prednisone which he states did not resolve the issue, but could have improved it some.  He feels his pain was still occurring but maybe less frequently, since completing the steroids.  He is prescribed meloxicam for his daily use.  He states the pain starts just below his buttocks and goes down the posterior aspect of his thigh-never radiates below the knee.  He also endorses having some pain near his anterior hip and superior anterior  thigh.  He reports he notices the anterior thigh pain and has had some weakness when trying to tie his shoe or flex his hip.  Patient does not desire physical therapy services.  Depression screen [facility] 2/9 01/23/2019 08/15/2017 06/26/2016 01/06/2015  Decreased Interest 0 0 0 0  Down, Depressed, Hopeless 0 0 0 0  PHQ - 2 Score 0 0 0 0    No Known Allergies Social History   Social History Narrative   Married, 1 son and 2 grandchildren.   Orig from [place] area, [employer].   Retired Electrical engineer (age 37).   [place]  Belongs to [employer].         Past Medical History:  Diagnosis Date  . BPH with obstruction/lower urinary tract symptoms    +Hx of acute urinary retention/acute cystitis: responding fairly well to tamsulosin and finasteride as of 09/2016 urol f/u.  Urolift and bladd bx 02/2018-->doing great as of 07/2019 urol f/u.  [name] Deafness in right ear   . ED (erectile dysfunction)   . Gross hematuria    hematuria protocol w/u being done by urol as of their 01/23/18 note.  [name] History of adenomatous polyp of colon    Dr. Oletta Lamas: last colonoscopy was 2017 and was normal.   5 yr recall.  [name] History of back injury    Back fracture fell from tree  . History of fracture of right ankle 05/2015  . History of staph infection  after back surgery  . HTN (hypertension)   . Hyperlipidemia   . Low back pain   . Nephrolithiasis 2015   CT showed 2cm left sided stone which was stable since 2006 imaging and likely in renal parenchyma.  2.5 cm as of 08/2017 neph f/u: renal u/s 12/2017-->no hydro. KUB showed stable L sided 18 mm stone. Cystoscopy 12/2017 erythematous mucosa at dome, no mass.   . Obesity, Class II, BMI 35-39.9   . OSTEOMYELITIS, CHRONIC 02/21/2006   Staph infection s/p lumbar surgery (surgery x 3).  Annotation: previous methicillin sensitive  staphylococcus aureus 2006 Qualifier: Diagnosis of  By: Johnnye Sima MD, Dellis Filbert  (ID Specialist)    . Prostate cancer screening    via  Urol--> PSA 1.39 April 2019. 1.36 Feb 2020.  [name] Tinnitus, left ear    Past Surgical History:  Procedure Laterality Date  . COLONOSCOPY  10/12/2015   repeat 5 years  . COLONOSCOPY W/ POLYPECTOMY  2007    Negative 2012 and 2017; Dr Ollen Barges 2022.  . CYSTOSCOPY     with bladder biopsy:  dome of bladder with an area of chronic inflammation.  No malignancy.  . CYSTOSCOPY WITH BIOPSY N/A 02/19/2018   Procedure: CYSTOSCOPY WITH BIOPSY/ FULGURATION / UROLIFT;  Surgeon: Festus Aloe, MD;  Location: [facility];  Service: Urology;  Laterality: N/A;  . LUMBAR LAMINECTOMY     Dr Vertell Limber performed 2nd & 3rd procedures (pt also has hx of fall and vertebral fracture years prior to his surgeries.  . TONSILLECTOMY     Family History  Problem Relation Age of Onset  . Stroke Mother   . Cancer Mother 56       melanoma  . Cancer Father 68       lung cancer; [employer] (nonsmoker)  . Heart disease Maternal Grandmother   . Heart disease Maternal Grandfather   . Heart disease Paternal Grandmother   . Cancer Brother        bladder cancer   Allergies as of 10/09/2019   No Known Allergies     Medication List       Accurate as of Oct 09, 2019 12:09 PM. If you have any questions, ask your nurse or doctor.        STOP taking these medications   predniSONE 20 MG tablet Commonly known as: DELTASONE Stopped by: Howard Pouch, DO     TAKE these medications   atorvastatin 20 MG tablet Commonly known as: LIPITOR TAKE 1 TABLET BY MOUTH EVERY DAY   diclofenac 75 MG EC tablet Commonly known as: VOLTAREN Take 1 tablet (75 mg total) by mouth 2 (two) times daily. Started by: Howard Pouch, DO   finasteride 5 MG tablet Commonly known as: PROSCAR Take 1 tablet (5 mg total) by mouth daily.   losartan 100 MG tablet Commonly known as: COZAAR Take 1 tablet (100 mg total) by mouth daily.   meloxicam 15 MG tablet Commonly known as: MOBIC TAKE 1 TABLET BY MOUTH DAILY AS NEEDED FOR  MUSCULOSKELETAL PAIN AS DIRECTED   metoprolol succinate 50 MG 24 hr tablet Commonly known as: TOPROL-XL Take 1 tablet (50 mg total) by mouth daily. Take with or immediately following a meal.   tamsulosin 0.4 MG Caps capsule Commonly known as: FLOMAX TAKE 1 CAPSULE BY MOUTH EVERY DAY   vitamin C 1000 MG tablet Take 1,000 mg by mouth daily.       All past medical history, surgical history, allergies, family history, immunizations andmedications were  updated in the EMR today and reviewed under the history and medication portions of their EMR.     ROS: Negative, with the exception of above mentioned in HPI   Objective:  BP 128/84 (BP Location: Left Arm, Patient Position: Sitting, Cuff Size: Normal)   Pulse 76   Temp 98.1 F (36.7 C) (Temporal)   Resp 18   Ht 5\' 8"  (1.727 m)   Wt 258 lb (117 kg)   SpO2 97%   BMI 39.23 kg/m  Body mass index is 39.23 kg/m. Gen: Afebrile. No acute distress. Nontoxic in appearance, well developed, well nourished.  HENT: AT. .  Eyes:Pupils Equal Round Reactive to light, Extraocular movements intact,  Conjunctiva without redness, discharge or icterus. MSK:  Lumbar spine/left lower extremity: Scar over lower thoracic through lumbar spine.  No erythema, no bony tenderness.  No paraspinal muscle fullness or tenderness.  No SI discomfort to palpation.  Negative straight leg raises bilaterally.  Negative FABRE a left.  Positive FABRE >anterior hip pain right.  Mild discomfort with right hip resisted flexion.  MS 5/5 bilateral lower extremity.  DTRs equal bilaterally.  Neurovascularly intact distally. Skin: No rashes, purpura or petechiae.  Neuro: Normal gait. PERLA. EOMi. Alert. Oriented x3 Cranial nerves.  Psych: Normal affect, dress and demeanor. Normal speech. Normal thought content and judgment.  No exam data present No results found. No results found for this or any previous visit (from the past 24 hour(s)).  Assessment/Plan: Derrick [name] is  a 69 y.o. male present for OV for  Hip pain/Sciatica of right side Patient having what he considers to be is sciatic pain, also with new features of anterior thigh pain.  On exam he has positive for anterior hip pain w/ FABRE test and mild discomfort with hip flexion. Lengthy discussion today covering possible etiologies of his discomfort and treatment options.  His sciatic pain could be causing more strain on his hip for compensation or he could be having hip pain that is causing his sciatic to flare secondary to compensation. The hip discomfort features are new and believe patient would benefit from x-ray of the right hip.  This [facility] him [facility].  He will go within the next few days to have that completed. For his sciatic discomfort discussed different types of anti-inflammatories and he is agreeable to discontinue Mobic and try Voltaren to see if it has better coverage for him.  He would like to avoid narcotics for pain control. Discussed physical therapy, OMT and chiropractic services with him.  He declines referral at this time but he will think of pursuing OMT in the future if needed.  He was provided with sciatica stretches/rehab exercises to start at home. Follow-up in PCP in 2-4 weeks after start of new medication, sooner if worsening. - DG Hip Unilat W OR W/O Pelvis 2-3 Views Right; Future     Reviewed expectations re: course of current medical issues.  Discussed self-management of symptoms.  Outlined signs and symptoms indicating need for more acute intervention.  Patient verbalized understanding and all questions were answered.  Patient received an After-Visit Summary.    Orders Placed This Encounter  Procedures  . DG Hip Unilat W OR W/O Pelvis 2-3 Views Right   Meds ordered this encounter  Medications  . DISCONTD: diclofenac (VOLTAREN) 75 MG EC tablet    Sig: Take 1 tablet (75 mg total) by mouth 2 (two) times daily.    Dispense:  30  tablet    Refill:  0    Dc mobic  . diclofenac (VOLTAREN) 75 MG EC tablet    Sig: Take 1 tablet (75 mg total) by mouth 2 (two) times daily.    Dispense:  60 tablet    Refill:  1    Dc mobic.  DC prior Voltaren prescription unless already filled.  If already filled just placed these on file for refills.  Thanks   Referral Orders  No referral(s) requested today     Note is dictated utilizing voice recognition software. Although note has been proof read prior to signing, occasional typographical errors still can be missed. If any questions arise, please do not hesitate to call for verification.   electronically signed by:  Howard Pouch, DO  [place]

## 2019-10-09 NOTE — Patient Instructions (Addendum)
Stop mobic and start diclofenac (it is twice a day) Start stretches below.  Please have xray of hip completed at [facility] in the next couple days.   If pain persist follow up with PCP and consider Dr. Tamala Julian for OMT   Sciatica Rehab Ask your health care provider which exercises are safe for you. Do exercises exactly as told by your health care provider and adjust them as directed. It is normal to feel mild stretching, pulling, tightness, or discomfort as you do these exercises. Stop right away if you feel sudden pain or your pain gets worse. Do not begin these exercises until told by your health care provider. Stretching and range-of-motion exercises These exercises warm up your muscles and joints and improve the movement and flexibility of your hips and back. These exercises also help to relieve pain, numbness, and tingling. Sciatic nerve glide 1. Sit in a chair with your head facing down toward your chest. Place your hands behind your back. Let your shoulders slump forward. 2. Slowly straighten one of your legs while you tilt your head back as if you are looking toward the ceiling. Only straighten your leg as far as you can without making your symptoms worse. 3. Hold this position for __________ seconds. 4. Slowly return to the starting position. 5. Repeat with your other leg. Repeat __________ times. Complete this exercise __________ times a day. Knee to chest with hip adduction and internal rotation  1. Lie on your back on a firm surface with both legs straight. 2. Bend one of your knees and move it up toward your chest until you feel a gentle stretch in your lower back and buttock. Then, move your knee toward the shoulder that is on the opposite side from your leg. This is hip adduction and internal rotation. ? Hold your leg in this position by holding on to the front of your knee. 3. Hold this position for __________ seconds. 4. Slowly return to the starting position. 5. Repeat  with your other leg. Repeat __________ times. Complete this exercise __________ times a day. Prone extension on elbows  1. Lie on your abdomen on a firm surface. A bed may be too soft for this exercise. 2. Prop yourself up on your elbows. 3. Use your arms to help lift your chest up until you feel a gentle stretch in your abdomen and your lower back. ? This will place some of your body weight on your elbows. If this is uncomfortable, try stacking pillows under your chest. ? Your hips should stay down, against the surface that you are lying on. Keep your hip and back muscles relaxed. 4. Hold this position for __________ seconds. 5. Slowly relax your upper body and return to the starting position. Repeat __________ times. Complete this exercise __________ times a day. Strengthening exercises These exercises build strength and endurance in your back. Endurance is the ability to use your muscles for a long time, even after they get tired. Pelvic tilt This exercise strengthens the muscles that lie deep in the abdomen. 1. Lie on your back on a firm surface. Bend your knees and keep your feet flat on the floor. 2. Tense your abdominal muscles. Tip your pelvis up toward the ceiling and flatten your lower back into the floor. ? To help with this exercise, you may place a small towel under your lower back and try to push your back into the towel. 3. Hold this position for __________ seconds. 4. Let your muscles relax  completely before you repeat this exercise. Repeat __________ times. Complete this exercise __________ times a day. Alternating arm and leg raises  1. Get on your hands and knees on a firm surface. If you are on a hard floor, you may want to use padding, such as an exercise mat, to cushion your knees. 2. Line up your arms and legs. Your hands should be directly below your shoulders, and your knees should be directly below your hips. 3. Lift your left leg behind you. At the same time,  raise your right arm and straighten it in front of you. ? Do not lift your leg higher than your hip. ? Do not lift your arm higher than your shoulder. ? Keep your abdominal and back muscles tight. ? Keep your hips facing the ground. ? Do not arch your back. ? Keep your balance carefully, and do not hold your breath. 4. Hold this position for __________ seconds. 5. Slowly return to the starting position. 6. Repeat with your right leg and your left arm. Repeat __________ times. Complete this exercise __________ times a day. Posture and body mechanics Good posture and healthy body mechanics can help to relieve stress in your body's tissues and joints. Body mechanics refers to the movements and positions of your body while you do your daily activities. Posture is part of body mechanics. Good posture means:  Your spine is in its natural S-curve position (neutral).  Your shoulders are pulled back slightly.  Your head is not tipped forward. Follow these guidelines to improve your posture and body mechanics in your everyday activities. Standing   When standing, keep your spine neutral and your feet about hip width apart. Keep a slight bend in your knees. Your ears, shoulders, and hips should line up.  When you do a task in which you stand in one place for a long time, place one foot up on a stable object that is 2-4 inches (5-10 cm) high, such as a footstool. This helps keep your spine neutral. Sitting   When sitting, keep your spine neutral and keep your feet flat on the floor. Use a footrest, if necessary, and keep your thighs parallel to the floor. Avoid rounding your shoulders, and avoid tilting your head forward.  When working at a desk or a computer, keep your desk at a height where your hands are slightly lower than your elbows. Slide your chair under your desk so you are close enough to maintain good posture.  When working at a computer, place your monitor at a height where you are  looking straight ahead and you do not have to tilt your head forward or downward to look at the screen. Resting  When lying down and resting, avoid positions that are most painful for you.  If you have pain with activities such as sitting, bending, stooping, or squatting, lie in a position in which your body does not bend very much. For example, avoid curling up on your side with your arms and knees near your chest (fetal position).  If you have pain with activities such as standing for a long time or reaching with your arms, lie with your spine in a neutral position and bend your knees slightly. Try the following positions: ? Lying on your side with a pillow between your knees. ? Lying on your back with a pillow under your knees. Lifting   When lifting objects, keep your feet at least shoulder width apart and tighten your abdominal muscles.  [employer]  your knees and hips and keep your spine neutral. It is important to lift using the strength of your legs, not your back. Do not lock your knees straight out.  Always ask for help to lift heavy or awkward objects. This information is not intended to replace advice given to you by your health care provider. Make sure you discuss any questions you have with your health care provider. Document Revised: 08/30/2018 Document Reviewed: 05/30/2018 Elsevier Patient Education  [place].

## 2019-10-09 NOTE — Telephone Encounter (Signed)
Pt can be scheduled for acute visit today. Please make wife aware that more than likely he will not receive a trigger point (or injection at the site of the pain) by Dr Raoul Pitch. Dr Raoul Pitch will have to review pts problem and make her recommendations

## 2019-10-14 ENCOUNTER — Ambulatory Visit (INDEPENDENT_AMBULATORY_CARE_PROVIDER_SITE_OTHER): Payer: Medicare Other

## 2019-10-14 ENCOUNTER — Other Ambulatory Visit: Payer: Self-pay

## 2019-10-14 DIAGNOSIS — M25559 Pain in unspecified hip: Secondary | ICD-10-CM

## 2019-10-14 DIAGNOSIS — M5431 Sciatica, right side: Secondary | ICD-10-CM | POA: Diagnosis not present

## 2019-11-18 ENCOUNTER — Other Ambulatory Visit: Payer: Self-pay | Admitting: Family Medicine

## 2019-12-08 ENCOUNTER — Other Ambulatory Visit: Payer: Self-pay | Admitting: Family Medicine

## 2019-12-08 NOTE — Telephone Encounter (Signed)
Was given by Dr. Raoul Pitch for sciatica. Do you want to continue?

## 2019-12-08 NOTE — Telephone Encounter (Signed)
Voltaren eRx'd

## 2019-12-20 ENCOUNTER — Encounter: Payer: Self-pay | Admitting: Family Medicine

## 2020-01-10 ENCOUNTER — Other Ambulatory Visit: Payer: Self-pay | Admitting: Family Medicine

## 2020-01-29 DIAGNOSIS — R3121 Asymptomatic microscopic hematuria: Secondary | ICD-10-CM | POA: Diagnosis not present

## 2020-01-29 DIAGNOSIS — N200 Calculus of kidney: Secondary | ICD-10-CM | POA: Diagnosis not present

## 2020-01-29 DIAGNOSIS — R3912 Poor urinary stream: Secondary | ICD-10-CM | POA: Diagnosis not present

## 2020-01-29 DIAGNOSIS — N401 Enlarged prostate with lower urinary tract symptoms: Secondary | ICD-10-CM | POA: Diagnosis not present

## 2020-02-09 ENCOUNTER — Other Ambulatory Visit: Payer: Self-pay | Admitting: Family Medicine

## 2020-03-14 ENCOUNTER — Other Ambulatory Visit: Payer: Self-pay | Admitting: Family Medicine

## 2020-04-05 ENCOUNTER — Other Ambulatory Visit: Payer: Self-pay | Admitting: Family Medicine

## 2020-04-10 ENCOUNTER — Other Ambulatory Visit: Payer: Self-pay | Admitting: Family Medicine

## 2020-05-04 ENCOUNTER — Other Ambulatory Visit: Payer: Self-pay | Admitting: Family Medicine

## 2020-05-04 NOTE — Telephone Encounter (Signed)
Pt has upcoming appt on 12/16. Will refill at that time

## 2020-05-05 ENCOUNTER — Other Ambulatory Visit: Payer: Self-pay

## 2020-05-06 ENCOUNTER — Encounter: Payer: Self-pay | Admitting: Family Medicine

## 2020-05-06 ENCOUNTER — Ambulatory Visit (INDEPENDENT_AMBULATORY_CARE_PROVIDER_SITE_OTHER): Payer: Medicare Other | Admitting: Family Medicine

## 2020-05-06 VITALS — BP 129/77 | HR 71 | Temp 97.8°F | Resp 16 | Ht 66.0 in | Wt 255.2 lb

## 2020-05-06 DIAGNOSIS — I10 Essential (primary) hypertension: Secondary | ICD-10-CM | POA: Diagnosis not present

## 2020-05-06 DIAGNOSIS — Z23 Encounter for immunization: Secondary | ICD-10-CM

## 2020-05-06 DIAGNOSIS — Z8781 Personal history of (healed) traumatic fracture: Secondary | ICD-10-CM

## 2020-05-06 DIAGNOSIS — E7800 Pure hypercholesterolemia, unspecified: Secondary | ICD-10-CM | POA: Diagnosis not present

## 2020-05-06 DIAGNOSIS — M25571 Pain in right ankle and joints of right foot: Secondary | ICD-10-CM

## 2020-05-06 DIAGNOSIS — Z0000 Encounter for general adult medical examination without abnormal findings: Secondary | ICD-10-CM

## 2020-05-06 LAB — LIPID PANEL
Cholesterol: 141 mg/dL (ref 0–200)
HDL: 43.2 mg/dL (ref >39.00)
LDL Cholesterol: 76 mg/dL (ref 0–99)
NonHDL: 98.25
Total CHOL/HDL Ratio: 3
Triglycerides: 110 mg/dL (ref 0.0–149.0)
VLDL: 22 mg/dL (ref 0.0–40.0)

## 2020-05-06 LAB — COMPREHENSIVE METABOLIC PANEL
ALT: 26 U/L (ref 0–53)
AST: 17 U/L (ref 0–37)
Albumin: 4.1 g/dL (ref 3.5–5.2)
Alkaline Phosphatase: 64 U/L (ref 39–117)
BUN: 17 mg/dL (ref 6–23)
CO2: 28 mEq/L (ref 19–32)
Calcium: 8.7 mg/dL (ref 8.4–10.5)
Chloride: 105 mEq/L (ref 96–112)
Creatinine, Ser: 0.98 mg/dL (ref 0.40–1.50)
GFR: 77.55 mL/min (ref >60.00)
Glucose, Bld: 93 mg/dL (ref 70–99)
Potassium: 4.5 mEq/L (ref 3.5–5.1)
Sodium: 139 mEq/L (ref 135–145)
Total Bilirubin: 0.7 mg/dL (ref 0.2–1.2)
Total Protein: 6.8 g/dL (ref 6.0–8.3)

## 2020-05-06 MED ORDER — TAMSULOSIN HCL 0.4 MG PO CAPS: 0.4000 mg | ORAL_CAPSULE | Freq: Every day | ORAL | 3 refills | Status: DC

## 2020-05-06 NOTE — Addendum Note (Signed)
Addended by: Deveron Furlong D on: 05/06/2020 11:10 AM   Modules accepted: Orders

## 2020-05-06 NOTE — Progress Notes (Signed)
Office Note 05/06/2020  CC:  Chief Complaint  Patient presents with  . Annual Exam    Pt is fasting    HPI:  Derrick [name] is a 69 y.o. White male who is here for annual health maintenance exam and 6 mo f/u HTN and HLD.  HTN: no home bp numbers available today. Compliant with meds.  HLD: tolerating statin.  Having R ankle pain in lateral aspect x 2 mo, worse with wt bearing and walking, no swelling or redness. Remote hx of R ankle stress fx. He's wearing lace up ankle support and it helps some.  R sided sciatica pain comes and goes.     Past Medical History:  Diagnosis Date  . BPH with obstruction/lower urinary tract symptoms    +Hx of acute urinary retention/acute cystitis: responding fairly well to tamsulosin and finasteride as of 09/2016 urol f/u.  Urolift and bladd bx 02/2018-->doing great as of 07/2019 urol f/u.  [name] Deafness in right ear   . ED (erectile dysfunction)   . Gross hematuria    hematuria protocol w/u being done by urol as of their 01/23/18 note.  [name] History of adenomatous polyp of colon    Dr. Oletta Lamas: last colonoscopy was 2017 and was normal.   5 yr recall.  [name] History of back injury    Back fracture fell from tree  . History of fracture of right ankle 05/2015  . History of staph infection    after back surgery  . HTN (hypertension)   . Hyperlipidemia   . Low back pain   . Microhematuria    chronic; pt to get rpt cysto 2021  . Nephrolithiasis 2015   CT showed 2cm left sided stone which was stable since 2006 imaging and likely in renal parenchyma.  2.5 cm as of 08/2017 neph f/u: renal u/s 12/2017-->no hydro. KUB showed stable L sided 18 mm stone. Cystoscopy 12/2017 erythematous mucosa at dome, no mass.   . Obesity, Class II, BMI 35-39.9   . OSTEOMYELITIS, CHRONIC 02/21/2006   Staph infection s/p lumbar surgery (surgery x 3).  Annotation: previous methicillin sensitive  staphylococcus aureus 2006 Qualifier: Diagnosis of  By: Johnnye Sima MD, Dellis Filbert  (ID  Specialist)    . Prostate cancer screening    via Urol--> PSA 1.39 April 2019. 1.36 Feb 2020.  [name] Tinnitus, left ear     Past Surgical History:  Procedure Laterality Date  . COLONOSCOPY  10/12/2015   repeat 5 years  . COLONOSCOPY W/ POLYPECTOMY  2007    Negative 2012 and 2017; Dr Ollen Barges 2022.  . CYSTOSCOPY     with bladder biopsy:  dome of bladder with an area of chronic inflammation.  No malignancy.  . CYSTOSCOPY WITH BIOPSY N/A 02/19/2018   Procedure: CYSTOSCOPY WITH BIOPSY/ FULGURATION / UROLIFT;  Surgeon: Festus Aloe, MD;  Location: [facility];  Service: Urology;  Laterality: N/A;  . CYSTOSCOPY WITH INSERTION OF UROLIFT  02/19/2018  . LUMBAR LAMINECTOMY     Dr Vertell Limber performed 2nd & 3rd procedures (pt also has hx of fall and vertebral fracture years prior to his surgeries.  . TONSILLECTOMY      Family History  Problem Relation Age of Onset  . Stroke Mother   . Cancer Mother 52       melanoma  . Cancer Father 54       lung cancer; [employer] 2 (nonsmoker)  . Heart disease Maternal Grandmother   . Heart disease Maternal Grandfather   . Heart  disease Paternal Grandmother   . Cancer Brother        bladder cancer    Social History   Socioeconomic History  . Marital status: Married    Spouse name: Not on file  . Number of children: Not on file  . Years of education: Not on file  . Highest education level: Not on file  Occupational History  . Not on file  Tobacco Use  . Smoking status: Former Smoker    Quit date: 05/22/1973    Years since quitting: 46.9  . Smokeless tobacco: Current User    Types: Chew  . Tobacco comment: 35 years ago as of 2013  Vaping Use  . Vaping Use: Never used  Substance and Sexual Activity  . Alcohol use: Yes  . Drug use: No  . Sexual activity: Not on file  Other Topics Concern  . Not on file  Social History Narrative   Married, 1 son and 2 grandchildren.   Orig from [place] area, [employer].   Retired Electrical engineer (age  19).   [place]  Belongs to [employer].         Social Determinants of Health   Financial Resource Strain: Not on file  Food Insecurity: Not on file  Transportation Needs: Not on file  Physical Activity: Not on file  Stress: Not on file  Social Connections: Not on file  Intimate Partner Violence: Not on file    Outpatient Medications Prior to Visit  Medication Sig Dispense Refill  . Ascorbic Acid (VITAMIN C) 1000 MG tablet Take 1,000 mg by mouth daily.    [name] atorvastatin (LIPITOR) 20 MG tablet TAKE 1 TABLET BY MOUTH EVERY DAY 90 tablet 0  . finasteride (PROSCAR) 5 MG tablet Take 1 tablet (5 mg total) by mouth daily. 90 tablet 3  . losartan (COZAAR) 100 MG tablet TAKE 1 TABLET BY MOUTH EVERY DAY 90 tablet 0  . meloxicam (MOBIC) 15 MG tablet TAKE 1 TABLET BY MOUTH DAILY AS NEEDED FOR MUSCULOSKELETAL PAIN AS DIRECTED 90 tablet 1  . metoprolol succinate (TOPROL-XL) 50 MG 24 hr tablet Take 1 tablet (50 mg total) by mouth daily. Take with or immediately following a meal. 90 tablet 3  . diclofenac (VOLTAREN) 75 MG EC tablet TAKE 1 TABLET BY MOUTH TWICE A DAY 60 tablet 5  . tamsulosin (FLOMAX) 0.4 MG CAPS capsule TAKE 1 CAPSULE BY MOUTH EVERY DAY (Patient not taking: Reported on 05/06/2020) 30 capsule 0   No facility-administered medications prior to visit.    No Known Allergies  ROS Review of Systems  Constitutional: Negative for appetite change, chills, fatigue and fever.  HENT: Negative for congestion, dental problem, ear pain and sore throat.   Eyes: Negative for discharge, redness and visual disturbance.  Respiratory: Negative for cough, chest tightness, shortness of breath and wheezing.   Cardiovascular: Negative for chest pain, palpitations and leg swelling.  Gastrointestinal: Negative for abdominal pain, blood in stool, diarrhea, nausea and vomiting.  Genitourinary: Negative for difficulty urinating, dysuria, flank pain, frequency, hematuria and urgency.   Musculoskeletal: Positive for arthralgias (chronic LBP, R ankle pain x 2 mo see hpi) and myalgias (sciatica pain on/off chronically). Negative for back pain, joint swelling and neck stiffness.  Skin: Negative for pallor and rash.  Neurological: Negative for dizziness, speech difficulty, weakness and headaches.  Hematological: Negative for adenopathy. Does not bruise/bleed easily.  Psychiatric/Behavioral: Negative for confusion and sleep disturbance. The patient is not nervous/anxious.    PE; Vitals  with BMI 05/06/2020 10/09/2019 09/23/2019  Height 5\' 6"  5\' 8"  5' 6.5"  Weight 255 lbs 3 oz 258 lbs 260 lbs  BMI 41.21 50.93 26.71  Systolic 245 809 983  Diastolic 77 84 74  Pulse 71 76 75     Gen: Alert, well appearing.  Patient is oriented to person, place, time, and situation. AFFECT: pleasant, lucid thought and speech. ENT: Ears: EACs clear, normal epithelium.  TMs with good light reflex and landmarks bilaterally.  Eyes: no injection, icteris, swelling, or exudate.  EOMI, PERRLA. Nose: no drainage or turbinate edema/swelling.  No injection or focal lesion.  Mouth: lips without lesion/swelling.  Oral mucosa pink and moist.  Dentition intact and without obvious caries or gingival swelling.  Oropharynx without erythema, exudate, or swelling.  Neck: supple/nontender.  No LAD, mass, or TM.  Carotid pulses 2+ bilaterally, without bruits. CV: RRR, no m/r/g.   LUNGS: CTA bilat, nonlabored resps, good aeration in all lung fields. ABD: soft, NT, ND, BS normal.  No hepatospenomegaly or mass.  No bruits. EXT: no clubbing, cyanosis, or edema.  Musculoskeletal: no joint swelling, erythema, warmth, or tenderness except mild TTP about lateral aspect of R ankle.    ROM of all joints intact. Skin - no sores or suspicious lesions or rashes or color changes   Pertinent labs:  Lab Results  Component Value Date   TSH 1.40 07/05/2015   Lab Results  Component Value Date   WBC 6.5 09/23/2019   HGB 17.0  09/23/2019   HCT 51.1 09/23/2019   MCV 91.2 09/23/2019   PLT 166.0 09/23/2019   Lab Results  Component Value Date   CREATININE 1.01 09/23/2019   BUN 21 09/23/2019   NA 140 09/23/2019   K 4.3 09/23/2019   CL 106 09/23/2019   CO2 30 09/23/2019   Lab Results  Component Value Date   ALT 17 09/23/2019   AST 14 09/23/2019   ALKPHOS 70 09/23/2019   BILITOT 0.7 09/23/2019   Lab Results  Component Value Date   CHOL 126 09/23/2019   Lab Results  Component Value Date   HDL 44.00 09/23/2019   Lab Results  Component Value Date   LDLCALC 65 09/23/2019   Lab Results  Component Value Date   TRIG 86.0 09/23/2019   Lab Results  Component Value Date   CHOLHDL 3 09/23/2019   Lab Results  Component Value Date   PSA 1.36 07/04/2018   PSA 1.95 06/21/2011   PSA 1.91 06/21/2010    ASSESSMENT AND PLAN:   1) HTN: stable. Cont toprol xl 50 qd and losartan 100 qd. Lytes/cr today.  2) HLD: tolerating atorva 20mg  qd. FLP and hepatic panel today.  3) R ankle pain, subacute. No known acute injury but remote hx of stress fx of that ankle. X-ray ankle ordered. He declined formal PT, says he'll do some home PT.  4) Preventative health: Vaccines: Flu->given today .  Otherwise ALL UTD. Labs: CMET and FLP (CBC normal 6 mo ago). Prostate ca screening: this screening is done by his urologist. Colon ca screening: recall 09/2020.  An After Visit Summary was printed and given to the patient.  FOLLOW UP:  Return in about 6 months (around 11/04/2020) for routine chronic illness f/u.  Signed:  Crissie Sickles, MD           05/06/2020

## 2020-05-06 NOTE — Patient Instructions (Signed)

## 2020-05-07 ENCOUNTER — Telehealth: Payer: Self-pay | Admitting: Family Medicine

## 2020-05-07 NOTE — Telephone Encounter (Signed)
Patient returned call for lab results. I relayed Dr. Idelle Leech message: All labs normal. Continue all current meds at current doses. Patient voiced understanding and stated he had seen his results in [place].

## 2020-05-07 NOTE — Telephone Encounter (Signed)
Documented in result note that patient advised of results.

## 2020-05-13 ENCOUNTER — Other Ambulatory Visit: Payer: Self-pay | Admitting: Family Medicine

## 2020-06-01 ENCOUNTER — Other Ambulatory Visit: Payer: Self-pay

## 2020-06-01 ENCOUNTER — Ambulatory Visit (HOSPITAL_BASED_OUTPATIENT_CLINIC_OR_DEPARTMENT_OTHER)
Admission: RE | Admit: 2020-06-01 | Discharge: 2020-06-01 | Disposition: A | Discharge status: Home / Self Care | Payer: Medicare Other | Source: Ambulatory Visit | Attending: Family Medicine | Admitting: Family Medicine

## 2020-06-01 DIAGNOSIS — M25571 Pain in right ankle and joints of right foot: Secondary | ICD-10-CM | POA: Diagnosis not present

## 2020-06-01 DIAGNOSIS — Z8781 Personal history of (healed) traumatic fracture: Secondary | ICD-10-CM | POA: Insufficient documentation

## 2020-06-01 DIAGNOSIS — M7989 Other specified soft tissue disorders: Secondary | ICD-10-CM | POA: Diagnosis not present

## 2020-06-12 DIAGNOSIS — Z23 Encounter for immunization: Secondary | ICD-10-CM | POA: Diagnosis not present

## 2020-06-17 ENCOUNTER — Telehealth: Payer: Self-pay | Admitting: Family Medicine

## 2020-06-17 MED ORDER — DICLOFENAC SODIUM 75 MG PO TBEC: DELAYED_RELEASE_TABLET | ORAL | 6 refills | Status: DC

## 2020-06-17 NOTE — Telephone Encounter (Signed)
Please advise if Rx change is okay

## 2020-06-17 NOTE — Telephone Encounter (Signed)
OK voltaren rx sent in. Stop mobic.

## 2020-06-17 NOTE — Telephone Encounter (Signed)
Patient states his arthritis has gotten worse lately. He switched back to the Voltaren that was given to him previously and he feels helped him better. Patient would like to discontinue Mobic and go back to Voltaren.

## 2020-06-17 NOTE — Telephone Encounter (Signed)
Patient advised and voiced understanding.  

## 2020-06-21 DIAGNOSIS — R351 Nocturia: Secondary | ICD-10-CM | POA: Diagnosis not present

## 2020-06-21 DIAGNOSIS — R3121 Asymptomatic microscopic hematuria: Secondary | ICD-10-CM | POA: Diagnosis not present

## 2020-06-21 DIAGNOSIS — N401 Enlarged prostate with lower urinary tract symptoms: Secondary | ICD-10-CM | POA: Diagnosis not present

## 2020-06-21 DIAGNOSIS — N329 Bladder disorder, unspecified: Secondary | ICD-10-CM | POA: Diagnosis not present

## 2020-07-04 ENCOUNTER — Other Ambulatory Visit: Payer: Self-pay | Admitting: Family Medicine

## 2020-07-07 ENCOUNTER — Other Ambulatory Visit: Payer: Self-pay | Admitting: Urology

## 2020-07-26 ENCOUNTER — Encounter (HOSPITAL_BASED_OUTPATIENT_CLINIC_OR_DEPARTMENT_OTHER): Payer: Self-pay | Admitting: Urology

## 2020-07-26 ENCOUNTER — Other Ambulatory Visit: Payer: Self-pay

## 2020-07-26 NOTE — Progress Notes (Signed)
Spoke w/ via phone for pre-op interview---pt Lab needs dos----    I stat , ekg           Lab results------vas US carotid 06-05-2017 epic COVID test ------07-27-2020 910 Arrive at -------730 am 07-30-2020 NPO after MN NO Solid Food.  Clear liquids from MN until---630 am then npo Medications to take morning of surgery -----none Diabetic medication -----n/a Patient Special Instructions -----none Pre-Op special Istructions -----none Patient verbalized understanding of instructions that were given at this phone interview. Patient denies shortness of breath, chest pain, fever, cough at this phone interview.

## 2020-07-27 ENCOUNTER — Other Ambulatory Visit (HOSPITAL_COMMUNITY)
Admission: RE | Admit: 2020-07-27 | Discharge: 2020-07-27 | Disposition: A | Discharge status: Home / Self Care | Payer: Medicare Other | Source: Ambulatory Visit | Attending: Urology | Admitting: Urology

## 2020-07-27 DIAGNOSIS — Z20822 Contact with and (suspected) exposure to covid-19: Secondary | ICD-10-CM | POA: Diagnosis not present

## 2020-07-27 DIAGNOSIS — Z01812 Encounter for preprocedural laboratory examination: Secondary | ICD-10-CM | POA: Insufficient documentation

## 2020-07-27 LAB — SARS CORONAVIRUS 2 (TAT 6-24 HRS): SARS Coronavirus 2: NEGATIVE

## 2020-07-29 NOTE — Anesthesia Preprocedure Evaluation (Addendum)
Anesthesia Evaluation  Patient identified by MRN, date of birth, ID band Patient awake    Reviewed: Allergy & Precautions, NPO status , Patient's Chart, lab work & pertinent test results, reviewed documented beta blocker date and time   History of Anesthesia Complications Negative for: history of anesthetic complications  Airway Mallampati: IV  TM Distance: >3 FB Neck ROM: Full   Comment: Small mouth Dental  (+) Chipped, Dental Advisory Given   Pulmonary former smoker,  07/27/2020 SARS coronavirus NEG   breath sounds clear to auscultation       Cardiovascular hypertension, Pt. on medications and Pt. on home beta blockers (-) angina Rhythm:Regular Rate:Normal     Neuro/Psych negative neurological ROS     GI/Hepatic Neg liver ROS, GERD  Controlled,  Endo/Other  Morbid obesity  Renal/GU Renal InsufficiencyRenal diseasestones Bladder dysfunction (bladder lesion)      Musculoskeletal   Abdominal (+) + obese,   Peds  Hematology negative hematology ROS (+)   Anesthesia Other Findings   Reproductive/Obstetrics                            Anesthesia Physical Anesthesia Plan  ASA: III  Anesthesia Plan: General   Post-op Pain Management:    Induction: Intravenous  PONV Risk Score and Plan: 2 and Ondansetron and Dexamethasone  Airway Management Planned: LMA  Additional Equipment: None  Intra-op Plan:   Post-operative Plan:   Informed Consent: I have reviewed the patients History and Physical, chart, labs and discussed the procedure including the risks, benefits and alternatives for the proposed anesthesia with the patient or authorized representative who has indicated his/her understanding and acceptance.     Dental advisory given  Plan Discussed with: CRNA and Surgeon  Anesthesia Plan Comments:        Anesthesia Quick Evaluation

## 2020-07-30 ENCOUNTER — Ambulatory Visit (HOSPITAL_BASED_OUTPATIENT_CLINIC_OR_DEPARTMENT_OTHER)
Admission: RE | Admit: 2020-07-30 | Discharge: 2020-07-30 | Disposition: A | Discharge status: Home / Self Care | Payer: Medicare Other | Attending: Urology | Admitting: Urology

## 2020-07-30 ENCOUNTER — Ambulatory Visit (HOSPITAL_BASED_OUTPATIENT_CLINIC_OR_DEPARTMENT_OTHER): Payer: Medicare Other | Admitting: Anesthesiology

## 2020-07-30 ENCOUNTER — Encounter (HOSPITAL_BASED_OUTPATIENT_CLINIC_OR_DEPARTMENT_OTHER)
Admission: RE | Disposition: A | Discharge status: Home / Self Care | Payer: Self-pay | Source: Home / Self Care | Attending: Urology

## 2020-07-30 ENCOUNTER — Encounter (HOSPITAL_BASED_OUTPATIENT_CLINIC_OR_DEPARTMENT_OTHER): Payer: Self-pay | Admitting: Urology

## 2020-07-30 DIAGNOSIS — Z79899 Other long term (current) drug therapy: Secondary | ICD-10-CM | POA: Diagnosis not present

## 2020-07-30 DIAGNOSIS — Z8052 Family history of malignant neoplasm of bladder: Secondary | ICD-10-CM | POA: Diagnosis not present

## 2020-07-30 DIAGNOSIS — N3289 Other specified disorders of bladder: Secondary | ICD-10-CM | POA: Diagnosis not present

## 2020-07-30 DIAGNOSIS — Z801 Family history of malignant neoplasm of trachea, bronchus and lung: Secondary | ICD-10-CM | POA: Insufficient documentation

## 2020-07-30 DIAGNOSIS — N3080 Other cystitis without hematuria: Secondary | ICD-10-CM | POA: Diagnosis not present

## 2020-07-30 DIAGNOSIS — E559 Vitamin D deficiency, unspecified: Secondary | ICD-10-CM | POA: Diagnosis not present

## 2020-07-30 DIAGNOSIS — Z808 Family history of malignant neoplasm of other organs or systems: Secondary | ICD-10-CM | POA: Insufficient documentation

## 2020-07-30 DIAGNOSIS — Z87891 Personal history of nicotine dependence: Secondary | ICD-10-CM | POA: Insufficient documentation

## 2020-07-30 DIAGNOSIS — N3081 Other cystitis with hematuria: Secondary | ICD-10-CM | POA: Diagnosis not present

## 2020-07-30 DIAGNOSIS — R3129 Other microscopic hematuria: Secondary | ICD-10-CM | POA: Diagnosis not present

## 2020-07-30 DIAGNOSIS — N433 Hydrocele, unspecified: Secondary | ICD-10-CM | POA: Diagnosis not present

## 2020-07-30 DIAGNOSIS — R3121 Asymptomatic microscopic hematuria: Secondary | ICD-10-CM | POA: Diagnosis not present

## 2020-07-30 HISTORY — PX: CYSTOSCOPY WITH FULGERATION: SHX6638

## 2020-07-30 HISTORY — DX: Personal history of urinary calculi: Z87.442

## 2020-07-30 HISTORY — DX: Personal history of other diseases of male genital organs: Z87.438

## 2020-07-30 LAB — POCT I-STAT, CHEM 8
BUN: 16 mg/dL (ref 8–23)
Calcium, Ion: 1.26 mmol/L (ref 1.15–1.40)
Chloride: 105 mmol/L (ref 98–111)
Creatinine, Ser: 0.8 mg/dL (ref 0.61–1.24)
Glucose, Bld: 106 mg/dL — ABNORMAL HIGH (ref 70–99)
HCT: 53 % — ABNORMAL HIGH (ref 39.0–52.0)
Hemoglobin: 18 g/dL — ABNORMAL HIGH (ref 13.0–17.0)
Potassium: 4.4 mmol/L (ref 3.5–5.1)
Sodium: 142 mmol/L (ref 135–145)
TCO2: 24 mmol/L (ref 22–32)

## 2020-07-30 SURGERY — CYSTOSCOPY, WITH BLADDER FULGURATION
Anesthesia: General | Site: Bladder

## 2020-07-30 MED ORDER — CEFAZOLIN SODIUM-DEXTROSE 2-4 GM/100ML-% IV SOLN
2.0000 g | Freq: Once | INTRAVENOUS | Status: AC
  Administered 2020-07-30: 2 g via INTRAVENOUS

## 2020-07-30 MED ORDER — OXYCODONE HCL 5 MG PO TABS: 5.0000 mg | ORAL_TABLET | Freq: Once | ORAL | Status: DC | PRN

## 2020-07-30 MED ORDER — ACETAMINOPHEN 500 MG PO TABS
ORAL_TABLET | ORAL | Status: AC
  Filled 2020-07-30: qty 2

## 2020-07-30 MED ORDER — PROPOFOL 10 MG/ML IV BOLUS
INTRAVENOUS | Status: AC
  Filled 2020-07-30: qty 20

## 2020-07-30 MED ORDER — EPHEDRINE 5 MG/ML INJ
INTRAVENOUS | Status: AC
  Filled 2020-07-30: qty 10

## 2020-07-30 MED ORDER — LACTATED RINGERS IV SOLN: INTRAVENOUS | Status: DC

## 2020-07-30 MED ORDER — FENTANYL CITRATE (PF) 100 MCG/2ML IJ SOLN: 25.0000 ug | INTRAMUSCULAR | Status: DC | PRN

## 2020-07-30 MED ORDER — ONDANSETRON HCL 4 MG/2ML IJ SOLN
INTRAMUSCULAR | Status: DC | PRN
  Administered 2020-07-30: 4 mg via INTRAVENOUS

## 2020-07-30 MED ORDER — CEFAZOLIN SODIUM-DEXTROSE 2-4 GM/100ML-% IV SOLN
INTRAVENOUS | Status: AC
  Filled 2020-07-30: qty 100

## 2020-07-30 MED ORDER — MEPERIDINE HCL 25 MG/ML IJ SOLN: 6.2500 mg | INTRAMUSCULAR | Status: DC | PRN

## 2020-07-30 MED ORDER — STERILE WATER FOR IRRIGATION IR SOLN
Status: DC | PRN
  Administered 2020-07-30: 3000 mL

## 2020-07-30 MED ORDER — LIDOCAINE 2% (20 MG/ML) 5 ML SYRINGE
INTRAMUSCULAR | Status: DC | PRN
  Administered 2020-07-30: 30 mg via INTRAVENOUS

## 2020-07-30 MED ORDER — PROMETHAZINE HCL 25 MG/ML IJ SOLN
6.2500 mg | INTRAMUSCULAR | Status: DC | PRN
Start: 2020-07-30 — End: 2020-07-30

## 2020-07-30 MED ORDER — DEXAMETHASONE SODIUM PHOSPHATE 10 MG/ML IJ SOLN
INTRAMUSCULAR | Status: AC
  Filled 2020-07-30: qty 1

## 2020-07-30 MED ORDER — FENTANYL CITRATE (PF) 100 MCG/2ML IJ SOLN
INTRAMUSCULAR | Status: AC
  Filled 2020-07-30: qty 2

## 2020-07-30 MED ORDER — PROPOFOL 10 MG/ML IV BOLUS
INTRAVENOUS | Status: DC | PRN
  Administered 2020-07-30: 200 mg via INTRAVENOUS

## 2020-07-30 MED ORDER — OXYCODONE HCL 5 MG/5ML PO SOLN: 5.0000 mg | Freq: Once | ORAL | Status: DC | PRN

## 2020-07-30 MED ORDER — LIDOCAINE HCL URETHRAL/MUCOSAL 2 % EX GEL
CUTANEOUS | Status: DC | PRN
  Administered 2020-07-30: 1

## 2020-07-30 MED ORDER — FENTANYL CITRATE (PF) 100 MCG/2ML IJ SOLN
INTRAMUSCULAR | Status: DC | PRN
  Administered 2020-07-30 (×2): 50 ug via INTRAVENOUS

## 2020-07-30 MED ORDER — ACETAMINOPHEN 500 MG PO TABS
1000.0000 mg | ORAL_TABLET | Freq: Once | ORAL | Status: AC
  Administered 2020-07-30: 1000 mg via ORAL

## 2020-07-30 MED ORDER — LIDOCAINE 2% (20 MG/ML) 5 ML SYRINGE
INTRAMUSCULAR | Status: AC
  Filled 2020-07-30: qty 5

## 2020-07-30 MED ORDER — ONDANSETRON HCL 4 MG/2ML IJ SOLN
INTRAMUSCULAR | Status: AC
  Filled 2020-07-30: qty 2

## 2020-07-30 MED ORDER — DEXAMETHASONE SODIUM PHOSPHATE 10 MG/ML IJ SOLN
INTRAMUSCULAR | Status: DC | PRN
  Administered 2020-07-30: 10 mg via INTRAVENOUS

## 2020-07-30 MED ORDER — EPHEDRINE SULFATE-NACL 50-0.9 MG/10ML-% IV SOSY
PREFILLED_SYRINGE | INTRAVENOUS | Status: DC | PRN
  Administered 2020-07-30 (×2): 10 mg via INTRAVENOUS

## 2020-07-30 MED ORDER — MIDAZOLAM HCL 2 MG/2ML IJ SOLN
0.5000 mg | Freq: Once | INTRAMUSCULAR | Status: DC | PRN
Start: 2020-07-30 — End: 2020-07-30

## 2020-07-30 SURGICAL SUPPLY — 21 items
BAG DRAIN URO-CYSTO SKYTR STRL (DRAIN) ×2 IMPLANT
BAG DRN RND TRDRP ANRFLXCHMBR (UROLOGICAL SUPPLIES)
BAG DRN UROCATH (DRAIN) ×1
BAG URINE DRAIN 2000ML AR STRL (UROLOGICAL SUPPLIES) IMPLANT
BAG URINE LEG 500ML (DRAIN) IMPLANT
CATH FOLEY 2WAY SLVR  5CC 16FR (CATHETERS)
CATH FOLEY 2WAY SLVR 5CC 16FR (CATHETERS) IMPLANT
CLOTH BEACON ORANGE TIMEOUT ST (SAFETY) ×2 IMPLANT
ELECT REM PT RETURN 9FT ADLT (ELECTROSURGICAL) ×2
ELECTRODE REM PT RTRN 9FT ADLT (ELECTROSURGICAL) ×1 IMPLANT
GLOVE SURG ENC MOIS LTX SZ7.5 (GLOVE) ×2 IMPLANT
GLOVE SURG ENC MOIS LTX SZ8 (GLOVE) IMPLANT
GLOVE SURG UNDER POLY LF SZ7.5 (GLOVE) ×4 IMPLANT
GOWN STRL REUS W/TWL LRG LVL3 (GOWN DISPOSABLE) ×4 IMPLANT
KIT TURNOVER CYSTO (KITS) ×2 IMPLANT
MANIFOLD NEPTUNE II (INSTRUMENTS) ×2 IMPLANT
NEEDLE HYPO 22GX1.5 SAFETY (NEEDLE) IMPLANT
NS IRRIG 500ML POUR BTL (IV SOLUTION) IMPLANT
PACK CYSTO (CUSTOM PROCEDURE TRAY) ×2 IMPLANT
TUBE CONNECTING 12X1/4 (SUCTIONS) ×2 IMPLANT
WATER STERILE IRR 3000ML UROMA (IV SOLUTION) ×2 IMPLANT

## 2020-07-30 NOTE — Anesthesia Postprocedure Evaluation (Signed)
Anesthesia Post Note  Patient: Derrick [name]  Procedure(s) Performed: CYSTOSCOPY WITH BLADDER BIOPSY/FULGERATION (N/A Bladder)     Patient location during evaluation: Phase II Anesthesia Type: General Level of consciousness: awake and alert, patient cooperative and oriented Pain management: pain level controlled Vital Signs Assessment: post-procedure vital signs reviewed and stable Respiratory status: spontaneous breathing, nonlabored ventilation and respiratory function stable Cardiovascular status: stable and blood pressure returned to baseline Postop Assessment: no apparent nausea or vomiting, able to ambulate and adequate PO intake Anesthetic complications: no   No complications documented.  Last Vitals:  Vitals:   07/30/20 1115 07/30/20 1145  BP: 126/70 119/73  Pulse: 72   Resp: 16 16  Temp:  36.5 C  SpO2: 93% 96%    Last Pain:  Vitals:   07/30/20 1145  TempSrc:   PainSc: 0-No pain                 Ellieanna Funderburg,E. Weslyn Holsonback

## 2020-07-30 NOTE — Transfer of Care (Signed)
Immediate Anesthesia Transfer of Care Note  Patient: Derrick [name]  Procedure(s) Performed: CYSTOSCOPY WITH BLADDER BIOPSY/FULGERATION (N/A Bladder)  Patient Location: PACU  Anesthesia Type:General  Level of Consciousness: awake, alert  and oriented  Airway & Oxygen Therapy: Patient Spontanous Breathing and Patient connected to face mask oxygen  Post-op Assessment: Report given to RN and Post -op Vital signs reviewed and stable  Post vital signs: Reviewed and stable  Last Vitals:  Vitals Value Taken Time  BP 131/76 07/30/20 1049  Temp    Pulse 81 07/30/20 1050  Resp 18 07/30/20 1050  SpO2 95 % 07/30/20 1050  Vitals shown include unvalidated device data.  Last Pain:  Vitals:   07/30/20 0754  TempSrc: Oral  PainSc: 0-No pain      Patients Stated Pain Goal: 4 (20/23/34 3568)  Complications: No complications documented.

## 2020-07-30 NOTE — H&P (Signed)
H&P  Chief Complaint: Microscopic hematuria, bladder erythema  History of Present Illness: Derrick [name] is a 70 year old male with a history of BPH.  He underwent UroLift in 2019 and there is an erythematous area biopsied at the dome of his bladder consistent with inflammation.  Or recently has had microscopic hematuria.  Upper tract imaging with ultrasound was benign.  Repeat cystoscopy showed an ulcerative lesion at the dome of the bladder that is persistent.  Cytology was benign and urine culture was negative.  He was brought today for cystoscopy and bladder biopsy.  He is doing well.  He has had no dysuria or gross hematuria.  Past Medical History:  Diagnosis Date  . BPH with obstruction/lower urinary tract symptoms    +Hx of acute urinary retention/acute cystitis: responding fairly well to tamsulosin and finasteride as of 09/2016 urol f/u.  Urolift and bladd bx 02/2018-->doing great as of 07/2019 urol f/u.  [name] Deafness in right ear   . Gross hematuria    hematuria protocol w/u being done by urol as of their 01/23/18 note.  [name] History of back injury    Back fracture fell from tree  . History of erectile dysfunction   . History of fracture of right ankle 05/2015  . History of kidney stones   . History of staph infection 10 yrs ago   after back surgery  . HTN (hypertension)   . Hyperlipidemia   . Low back pain   . Microhematuria    chronic; pt to get rpt cysto 2021  . Nephrolithiasis 2015   CT showed 2cm left sided stone which was stable since 2006 imaging and likely in renal parenchyma.  2.5 cm as of 08/2017 neph f/u: renal u/s 12/2017-->no hydro. KUB showed stable L sided 18 mm stone. Cystoscopy 12/2017 erythematous mucosa at dome, no mass.   . Obesity, Class II, BMI 35-39.9   . OSTEOMYELITIS, CHRONIC 02/21/2006   Staph infection s/p lumbar surgery (surgery x 3).  Annotation: previous methicillin sensitive  staphylococcus aureus 2006 Qualifier: Diagnosis of  By: Johnnye Sima MD, Dellis Filbert  (ID  Specialist)    . Prostate cancer screening    via Urol--> PSA 1.39 April 2019. 1.36 Feb 2020.  [name] Tinnitus, left ear    Past Surgical History:  Procedure Laterality Date  . COLONOSCOPY  10/12/2015   repeat 5 years  . COLONOSCOPY W/ POLYPECTOMY  2007    Negative 2012 and 2017; Dr Ollen Barges 2022.  . CYSTOSCOPY     with bladder biopsy:  dome of bladder with an area of chronic inflammation.  No malignancy.  . CYSTOSCOPY WITH BIOPSY N/A 02/19/2018   Procedure: CYSTOSCOPY WITH BIOPSY/ FULGURATION / UROLIFT;  Surgeon: Festus Aloe, MD;  Location: [facility];  Service: Urology;  Laterality: N/A;  . CYSTOSCOPY WITH INSERTION OF UROLIFT  02/19/2018  . LUMBAR LAMINECTOMY     Dr Vertell Limber performed 2nd & 3rd procedures (pt also has hx of fall and vertebral fracture years prior to his surgeries.  . TONSILLECTOMY  as child    Home Medications:  Medications Prior to Admission  Medication Sig Dispense Refill Last Dose  . Ascorbic Acid (VITAMIN C) 1000 MG tablet Take 1,000 mg by mouth at bedtime.   Past Week at Unknown time  . atorvastatin (LIPITOR) 20 MG tablet TAKE 1 TABLET BY MOUTH EVERY DAY (Patient taking differently: at bedtime.) 90 tablet 1 07/29/2020 at Unknown time  . diclofenac (VOLTAREN) 75 MG EC tablet 1 tab po bid as needed for arthritis  pain 60 tablet 6 07/29/2020 at Unknown time  . finasteride (PROSCAR) 5 MG tablet Take 1 tablet (5 mg total) by mouth daily. (Patient taking differently: Take 5 mg by mouth at bedtime.) 90 tablet 3 07/29/2020 at Unknown time  . losartan (COZAAR) 100 MG tablet TAKE 1 TABLET BY MOUTH EVERY DAY (Patient taking differently: at bedtime.) 90 tablet 0 07/29/2020 at Unknown time  . metoprolol succinate (TOPROL-XL) 50 MG 24 hr tablet Take 1 tablet (50 mg total) by mouth daily. Take with or immediately following a meal. (Patient taking differently: Take 50 mg by mouth at bedtime. Take with or immediately following a meal.) 90 tablet 3 07/29/2020 at 1800  . tamsulosin  (FLOMAX) 0.4 MG CAPS capsule Take 1 capsule (0.4 mg total) by mouth daily. (Patient taking differently: Take 0.4 mg by mouth at bedtime.) 90 capsule 3 07/29/2020 at Unknown time   Allergies: No Known Allergies  Family History  Problem Relation Age of Onset  . Stroke Mother   . Cancer Mother 56       melanoma  . Cancer Father 105       lung cancer; [employer] 2 (nonsmoker)  . Heart disease Maternal Grandmother   . Heart disease Maternal Grandfather   . Heart disease Paternal Grandmother   . Cancer Brother        bladder cancer   Social History:  reports that he quit smoking about 47 years ago. His smoking use included cigarettes. He has a 40.00 pack-year smoking history. His smokeless tobacco use includes chew. He reports previous alcohol use. He reports that he does not use drugs.  ROS: A complete review of systems was performed.  All systems are negative except for pertinent findings as noted. Review of Systems  All other systems reviewed and are negative.    Physical Exam:  Vital signs in last 24 hours: Temp:  [97.4 F (36.3 C)] 97.4 F (36.3 C) (03/11 0754) Pulse Rate:  [67] 67 (03/11 0754) Resp:  [18] 18 (03/11 0754) BP: (134)/(84) 134/84 (03/11 0754) SpO2:  [96 %] 96 % (03/11 0754) Weight:  [117.7 kg] 117.7 kg (03/11 0754) General:  Alert and oriented, No acute distress HEENT: Normocephalic, atraumatic Cardiovascular: Regular rate and rhythm Lungs: Regular rate and effort Abdomen: Soft, nontender, nondistended, no abdominal masses Back: No CVA tenderness Extremities: No edema Neurologic: Grossly intact  Laboratory Data:  Results for orders placed or performed during the hospital encounter of 07/30/20 (from the past 24 hour(s))  I-STAT, chem 8     Status: Abnormal   Collection Time: 07/30/20  8:16 AM  Result Value Ref Range   Sodium 142 135 - 145 mmol/L   Potassium 4.4 3.5 - 5.1 mmol/L   Chloride 105 98 - 111 mmol/L   BUN 16 8 - 23 mg/dL   Creatinine, Ser  0.80 0.61 - 1.24 mg/dL   Glucose, Bld 106 (H) 70 - 99 mg/dL   Calcium, Ion 1.26 1.15 - 1.40 mmol/L   TCO2 24 22 - 32 mmol/L   Hemoglobin 18.0 (H) 13.0 - 17.0 g/dL   HCT 53.0 (H) 39.0 - 52.0 %   Recent Results (from the past 240 hour(s))  SARS CORONAVIRUS 2 (TAT 6-24 HRS) Nasopharyngeal Nasopharyngeal Swab     Status: None   Collection Time: 07/27/20  9:30 AM   Specimen: Nasopharyngeal Swab  Result Value Ref Range Status   SARS Coronavirus 2 NEGATIVE NEGATIVE Final    Comment: (NOTE) SARS-CoV-2 target nucleic acids are NOT  DETECTED.  The SARS-CoV-2 RNA is generally detectable in upper and lower respiratory specimens during the acute phase of infection. Negative results do not preclude SARS-CoV-2 infection, do not rule out co-infections with other pathogens, and should not be used as the sole basis for treatment or other patient management decisions. Negative results must be combined with clinical observations, patient history, and epidemiological information. The expected result is Negative.  Fact Sheet for Patients: SugarRoll.be  Fact Sheet for Healthcare Providers: https://www.woods-mathews.com/  This test is not yet approved or cleared by the [place] FDA and  has been authorized for detection and/or diagnosis of SARS-CoV-2 by FDA under an Emergency Use Authorization (EUA). This EUA will remain  in effect (meaning this test can be used) for the duration of the COVID-19 declaration under Se ction 564(b)(1) of the Act, 21 U.S.C. section 360bbb-3(b)(1), unless the authorization is terminated or revoked sooner.  Performed at [facility], [place] [address] [place], [place] [address]    Creatinine: Recent Labs    07/30/20 0816  CREATININE 0.80    Impression/Assessment/plan: I discussed with the patient the nature, potential benefits, risks and alternatives to cystoscopy with bladder biopsy and fulguration, including side  effects of the proposed treatment, the likelihood of the patient achieving the goals of the procedure, and any potential problems that might occur during the procedure or recuperation.  We discussed he may need a Foley catheter postop.  All questions answered. Patient elects to proceed.    Festus Aloe 07/30/2020, 9:12 AM

## 2020-07-30 NOTE — Anesthesia Procedure Notes (Signed)
Procedure Name: LMA Insertion Date/Time: 07/30/2020 10:11 AM Performed by: Genelle Bal, CRNA Pre-anesthesia Checklist: Patient identified, Emergency Drugs available, Suction available and Patient being monitored Patient Re-evaluated:Patient Re-evaluated prior to induction Oxygen Delivery Method: Circle system utilized Preoxygenation: Pre-oxygenation with 100% oxygen Induction Type: IV induction Ventilation: Mask ventilation without difficulty LMA: LMA inserted LMA Size: 5.0 Number of attempts: 1 Airway Equipment and Method: Bite block Placement Confirmation: positive ETCO2 Tube secured with: Tape Dental Injury: Teeth and Oropharynx as per pre-operative assessment

## 2020-07-30 NOTE — Op Note (Signed)
Preoperative diagnosis: Microscopic hematuria, bladder erythema Postoperative diagnosis: Same  Procedure: Cystoscopy with bladder biopsy and fulguration 0.5 to 2 cm  Surgeon: Junious Silk  Anesthesia: General  Indication for procedure: Mr. Hudlow is a 70 year old male with a history of BPH and lower urinary tract symptoms.  He had a UroLift and bladder biopsy done for an area of erythema in the bladder in 2009.  This was benign.  He underwent recent microscopic hematuria evaluation and again was noted an ulcerative distinct lesion up on the bladder wall toward the dome.  Cytology was benign and culture negative.  Findings: On exam under anesthesia the penis was circumcised without mass or lesion.  Testicles were descended bilaterally and palpably normal.  Small bilateral hydroceles.  On rectal exam the prostate was about 30 g and smooth without hard area or nodule.  On cystoscopy the bladder had a good channel from prior prostatic urethral lift but somewhat of a high bladder neck.  There was moderate trabeculation and telangiectasia and some petechiae development after bladder distention when looking around the bladder.  The ulcerative lesion was more left superior bladder wall.  There was a small area of erythema up toward the anterior dome which may have been the prior biopsy site.  This was lightly fulgurated at the dome.  The left lesion was biopsied x2.  It did appear inflammatory in nature.  There were no papillary mucosal lesions.  There were no stones or foreign bodies in the bladder.  Ureteral orifice ease were in their normal orthotopic position with clear efflux.  Description of procedure: After consent was obtained patient was brought to the operating room.  After adequate anesthesia was placed lithotomy position and prepped and draped in the usual sterile fashion.  A timeout was performed to confirm the patient and procedure.  Exam under anesthesia was done.  Cystoscope was passed per  urethra the bladder carefully inspected with a 30 degree and 70 degree lens.  I then took the cold cup biopsy forceps and biopsied the left bladder wall lesion x2.  The entire lesion was fulgurated and area of about 2 cm.  Very faint erythema on the dome more anterior about 5 mm was lightly fulgurated.  No other lesions were noted in the bladder.  The bladder was drained.  Hemostasis was excellent.  The scope was removed.  Some lidocaine jelly was instilled per urethra.  He was awakened and taken recovery room in stable condition.  Complications: None  Blood loss: Minimal  Specimens to pathology: Left bladder wall biopsy x2  Drains: None  Disposition: Patient stable to PACU

## 2020-07-30 NOTE — Discharge Instructions (Signed)
Bladder Biopsy, Care After This sheet gives you information about how to care for yourself after your procedure. Your health care provider may also give you more specific instructions. If you have problems or questions, contact your health care provider. What can I expect after the procedure? After the procedure, it is common to have:  Mild pain in your bladder or kidney area during urination.  Minor burning during urination.  Small amounts of blood in your urine.  A sudden urge to urinate.  A need to urinate more often than usual. Follow these instructions at home: Medicines  Take over-the-counter and prescription medicines only as told by your health care provider.  If you were prescribed an antibiotic medicine, take it as told by your health care provider. Do not stop taking the antibiotic even if you start to feel better. Activity  Rest if told by your health care provider.  Do not drive for 24 hours if you received a medicine to help you relax (sedative) during your procedure. Ask your health care provider when it is safe for you to drive.  Return to your normal activities as told by your health care provider. Ask your health care provider what activities are safe for you. General instructions  Take a warm bath to relieve any burning sensations around your urethra.  Hold a warm, damp washcloth over the urethral area to ease pain.  It is up to you to get the results of your procedure. Ask your health care provider, or the department that is doing the procedure, when your results will be ready.  Keep all follow-up visits as told by your health care provider. This is important.   Contact a health care provider if:  You have a fever.  Your symptoms do not improve within 24 hours, and you continue to have: ? Burning during urination. ? Increasing amounts of blood in your urine. ? Pain during urination. ? An urgent need to urinate. ? A need to urinate more often than  usual. Get help right away if:  You have a lot of bleeding or more bleeding.  You have severe pain.  You are unable to urinate.  You have bright red blood in your urine.  You are passing blood clots in your urine.  You have a fever. Summary  After the procedure, it is common to have mild pain, burning with urination, and some blood.  Take medicines as told. If you were given antibiotics, finish all of it even if you start to feel better.  Rest after the procedure. Follow your health care provider's instructions for self care at home.  Contact a health care provider if your symptoms do not improve within 24 hours, or if you have more pain or more blood in your urine.  Get help right away if you have a lot of bleeding, severe pain, fever, or bright red blood or blood clots in the urine. This information is not intended to replace advice given to you by your health care provider. Make sure you discuss any questions you have with your health care provider. Document Revised: 11/13/2018 Document Reviewed: 11/13/2018 Elsevier Patient Education  [place].

## 2020-08-02 ENCOUNTER — Encounter (HOSPITAL_BASED_OUTPATIENT_CLINIC_OR_DEPARTMENT_OTHER): Payer: Self-pay | Admitting: Urology

## 2020-08-02 LAB — SURGICAL PATHOLOGY

## 2020-08-05 ENCOUNTER — Other Ambulatory Visit: Payer: Self-pay | Admitting: Family Medicine

## 2020-08-05 ENCOUNTER — Other Ambulatory Visit: Payer: Self-pay

## 2020-08-05 MED ORDER — LOSARTAN POTASSIUM 100 MG PO TABS: 100.0000 mg | ORAL_TABLET | Freq: Every day | ORAL | 0 refills | Status: DC

## 2020-08-05 NOTE — Telephone Encounter (Signed)
Called [facility] in [place], they have Losartan in stock for qty of 90. Spoke with pt and wife regarding update and med being out of stock at [place]. Rx will be sent to [facility] and they will call and check price prior to picking up and let us know if something else needs to be done.

## 2020-08-18 IMAGING — DX DG HIP (WITH OR WITHOUT PELVIS) 2-3V*R*
3 series · 3 of 3 positions shown · non-contrast
Comparison: None.

CLINICAL DATA: Chronic right anterior hip and thigh pain

EXAM:
DG HIP (WITH OR WITHOUT PELVIS) 2-3V RIGHT

[pelvis ap]
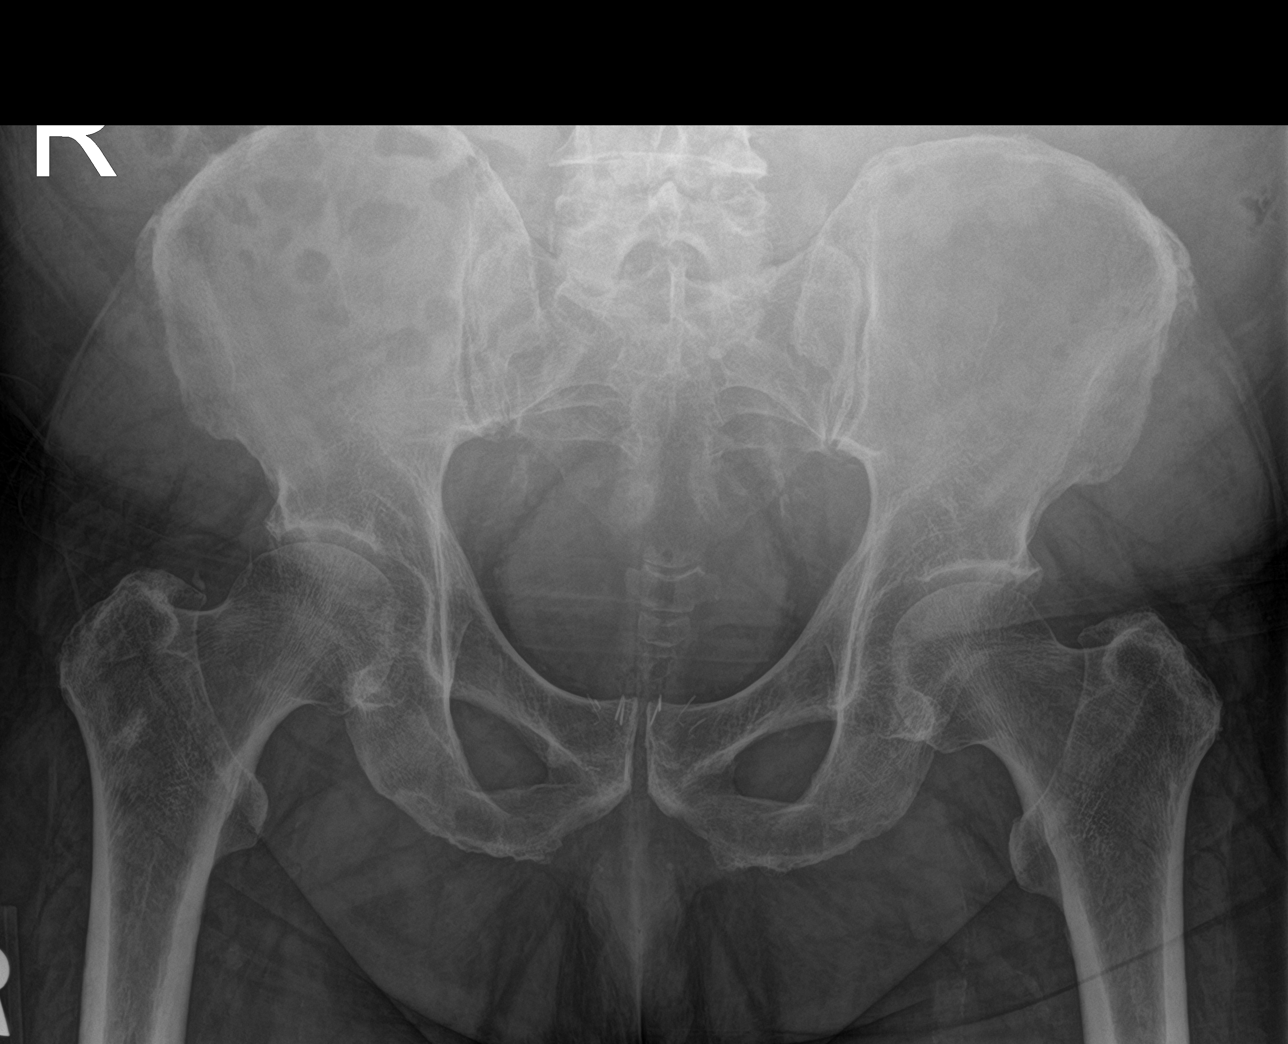

[hip ap]
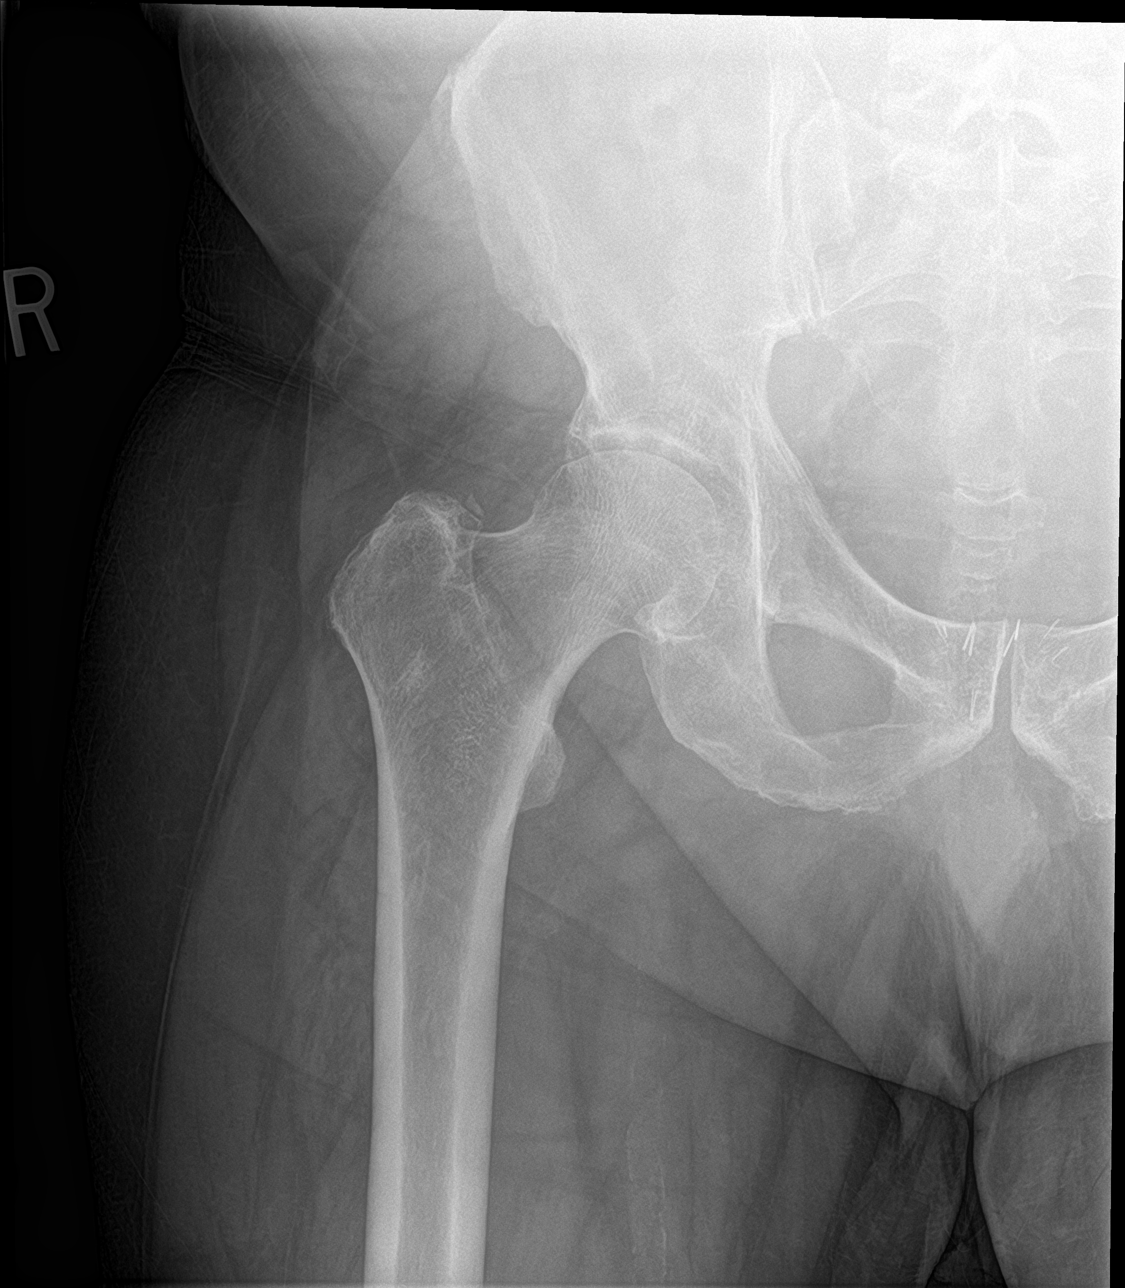

[hip lat]
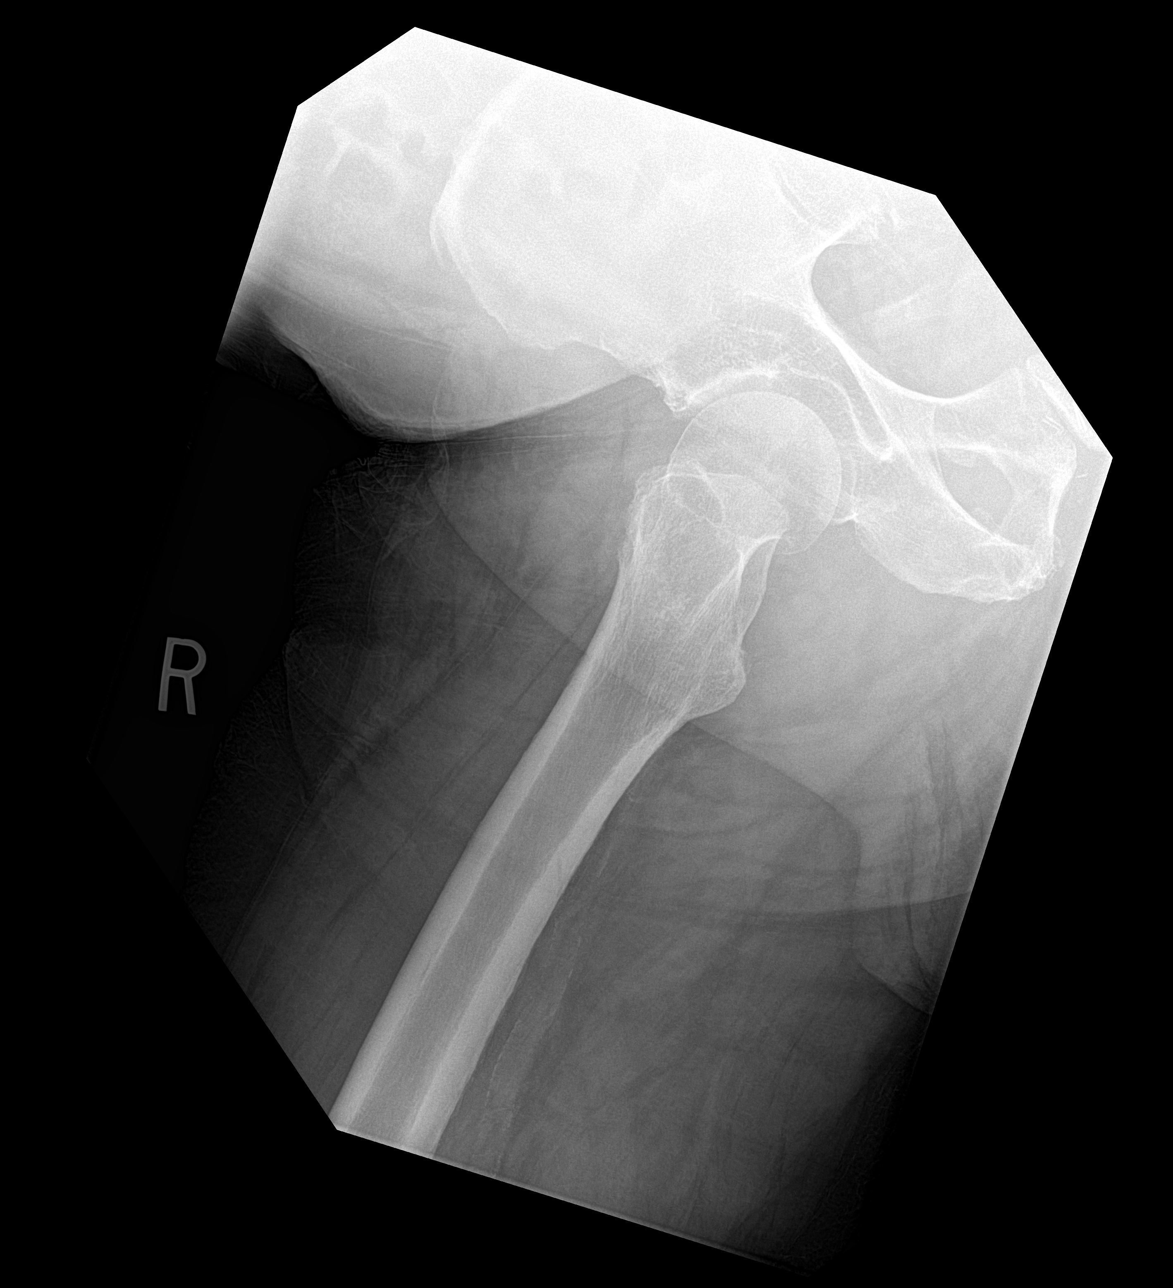

[3 of 3 positions shown; findings below may reference images not displayed]

FINDINGS: Frontal view of the pelvis as well as frontal and frogleg lateral
views of the right hip demonstrate no fractures. Alignment is
anatomic. Joint spaces are well preserved. There is mild spondylosis
of the lower lumbar spine.
IMPRESSION: 1. No acute bony abnormality.
2. Mild lower lumbar spondylosis.

## 2020-09-11 ENCOUNTER — Other Ambulatory Visit: Payer: Self-pay | Admitting: Family Medicine

## 2020-10-03 DIAGNOSIS — Z23 Encounter for immunization: Secondary | ICD-10-CM | POA: Diagnosis not present

## 2020-10-05 ENCOUNTER — Other Ambulatory Visit: Payer: Self-pay | Admitting: Family Medicine

## 2020-10-29 ENCOUNTER — Other Ambulatory Visit: Payer: Self-pay | Admitting: Family Medicine

## 2020-11-04 DIAGNOSIS — R3121 Asymptomatic microscopic hematuria: Secondary | ICD-10-CM | POA: Diagnosis not present

## 2020-11-04 DIAGNOSIS — N401 Enlarged prostate with lower urinary tract symptoms: Secondary | ICD-10-CM | POA: Diagnosis not present

## 2020-11-04 DIAGNOSIS — N200 Calculus of kidney: Secondary | ICD-10-CM | POA: Diagnosis not present

## 2020-11-04 DIAGNOSIS — R350 Frequency of micturition: Secondary | ICD-10-CM | POA: Diagnosis not present

## 2020-11-09 ENCOUNTER — Encounter: Payer: Self-pay | Admitting: Family Medicine

## 2020-11-09 NOTE — Progress Notes (Signed)
OFFICE VISIT  11/10/2020  CC:  Chief Complaint  Patient presents with   Follow-up    6 mo RCI. Pt is fasting     HPI:    Patient is a 70 y.o. Caucasian male who presents for 6 mo f/u HTN and HLD. A/P as of last visit: "1) HTN: stable. Cont toprol xl 50 qd and losartan 100 qd. Lytes/cr today.   2) HLD: tolerating atorva 20mg  qd. FLP and hepatic panel today.   3) R ankle pain, subacute. No known acute injury but remote hx of stress fx of that ankle. X-ray ankle ordered. He declined formal PT, says he'll do some home PT.   4) Preventative health: Vaccines: Flu->given today .  Otherwise ALL UTD. Labs: CMET and FLP (CBC normal 6 mo ago). Prostate ca screening: this screening is done by his urologist. Colon ca screening: recall 09/2020."  INTERIM HX: All labs were normal last visit. Feeling fine.  Compliant with meds. Occ home bp check consistently <130/80.    Sleeps good 9 hours nightly.  He snores but no witnessed apnea. No excessive daytime sleepiness.    HLD: taking atorva 20 qd.  Describes typical GER sx's, esp with spicy, greasy, and junk/sweet food. Not taking any reflux med.  No pain with swallowing, no dysphagia.  No abd pain.  Appetite is good.  ROS as above, plus--> no fevers, no CP, no SOB, no wheezing, no cough, no dizziness, no HAs, no rashes, no melena/hematochezia.  No polyuria or polydipsia.  No myalgias or arthralgias.  No focal weakness, paresthesias, or tremors.  No acute vision or hearing abnormalities.  No dysuria or unusual/new urinary urgency or frequency.  No recent changes in lower legs. No n/v/d or abd pain.  No palpitations.     Past Medical History:  Diagnosis Date   BPH with obstruction/lower urinary tract symptoms    +Hx of acute urinary retention/acute cystitis: responding fairly well to tamsulosin and finasteride as of 09/2016 urol f/u.  Urolift and bladd bx 02/2018-->doing great as of 07/2019 urol f/u.   Deafness in right ear    Gross  hematuria    hematuria protocol w/u being done by urol as of their 01/23/18 note.   History of back injury    Back fracture fell from tree   History of erectile dysfunction    History of fracture of right ankle 05/2015   History of kidney stones    History of staph infection 10 yrs ago   after back surgery   HTN (hypertension)    Hyperlipidemia    Low back pain    Microhematuria    chronic; pt gets periodic cystoscopy for monitoring, most recent 07/30/20-->bx showed benign urothelium and submucosa, with eosinophilic cystitis.   Nephrolithiasis 2015   CT showed 2cm left sided stone which was stable since 2006 imaging and likely in renal parenchyma.  2.5 cm as of 08/2017 neph f/u: renal u/s 12/2017-->no hydro. KUB showed stable L sided 18 mm stone. Cystoscopy 12/2017 erythematous mucosa at dome, no mass.    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  Annotation: previous methicillin sensitive  staphylococcus aureus 2006 Qualifier: Diagnosis of  By: Johnnye Sima MD, Dellis Filbert  (ID Specialist)     Prostate cancer screening    via Urol--> PSA 1.39 April 2019. 1.36 Feb 2020.   Tinnitus, left ear     Past Surgical History:  Procedure Laterality Date  COLONOSCOPY  10/12/2015   repeat 5 years   COLONOSCOPY W/ POLYPECTOMY  2007   Negative 2012 and 2017; Dr Ollen Barges 2022.   CYSTOSCOPY     with bladder biopsy:  dome of bladder with an area of chronic inflammation.  No malignancy.   CYSTOSCOPY WITH BIOPSY N/A 02/19/2018   Procedure: CYSTOSCOPY WITH BIOPSY/ FULGURATION / UROLIFT;  Surgeon: Festus Aloe, MD;  Location: [facility];  Service: Urology;  Laterality: N/A;   CYSTOSCOPY WITH FULGERATION N/A 07/30/2020   Bx benign. Procedure: CYSTOSCOPY WITH BLADDER BIOPSY/FULGERATION;  Surgeon: Festus Aloe, MD;  Location: [facility];  Service: Urology;  Laterality: N/A;   CYSTOSCOPY WITH INSERTION OF UROLIFT   02/19/2018   LUMBAR LAMINECTOMY     Dr Vertell Limber performed 2nd & 3rd procedures (pt also has hx of fall and vertebral fracture years prior to his surgeries.   TONSILLECTOMY  as child   Social History   Socioeconomic History   Marital status: Married    Spouse name: Not on file   Number of children: Not on file   Years of education: Not on file   Highest education level: Not on file  Occupational History   Not on file  Tobacco Use   Smoking status: Former    Packs/day: 2.00    Years: 20.00    Pack years: 40.00    Types: Cigarettes    Quit date: 05/22/1973    Years since quitting: 47.5   Smokeless tobacco: Current    Types: Chew   Tobacco comments:    35 years ago as of 2013  Vaping Use   Vaping Use: Never used  Substance and Sexual Activity   Alcohol use: Not Currently   Drug use: No   Sexual activity: Not on file  Other Topics Concern   Not on file  Social History Narrative   Married, 1 son and 2 grandchildren.   Orig from [place] area, [employer].   Retired Electrical engineer (age 12).   [place] ([place]  Belongs to [employer].         Social Determinants of Health   Financial Resource Strain: Not on file  Food Insecurity: Not on file  Transportation Needs: Not on file  Physical Activity: Not on file  Stress: Not on file  Social Connections: Not on file   Outpatient Medications Prior to Visit  Medication Sig Dispense Refill   Ascorbic Acid (VITAMIN C) 1000 MG tablet Take 1,000 mg by mouth at bedtime.     atorvastatin (LIPITOR) 20 MG tablet TAKE 1 TABLET BY MOUTH EVERY DAY (Patient taking differently: at bedtime.) 90 tablet 1   diclofenac (VOLTAREN) 75 MG EC tablet 1 tab po bid as needed for arthritis pain 60 tablet 6   finasteride (PROSCAR) 5 MG tablet Take 1 tablet (5 mg total) by mouth at bedtime. 90 tablet 1   metoprolol succinate (TOPROL-XL) 50 MG 24 hr tablet TAKE 1 TABLET BY MOUTH DAILY. TAKE WITH OR IMMEDIATELY FOLLOWING A MEAL. 14 tablet 0    tamsulosin (FLOMAX) 0.4 MG CAPS capsule Take 1 capsule (0.4 mg total) by mouth daily. (Patient taking differently: Take 0.4 mg by mouth at bedtime.) 90 capsule 3   losartan (COZAAR) 100 MG tablet Take 1 tablet (100 mg total) by mouth daily. 90 tablet 0   No facility-administered medications prior to visit.    No Known Allergies  ROS As per HPI  PE: Vitals with BMI 11/10/2020 07/30/2020 07/30/2020  Height - - -  Weight 257 lbs 13 oz - -  BMI - - -  Systolic [phone]  Diastolic 74 73 70  Pulse 77 - 72   Gen: Alert, well appearing.  Patient is oriented to person, place, time, and situation. AFFECT: pleasant, lucid thought and speech. CV: RRR, no m/r/g.   LUNGS: CTA bilat, nonlabored resps, good aeration in all lung fields. EXT: no clubbing or cyanosis.  no edema.    LABS:  Lab Results  Component Value Date   TSH 1.40 07/05/2015   Lab Results  Component Value Date   WBC 6.5 09/23/2019   HGB 18.0 (H) 07/30/2020   HCT 53.0 (H) 07/30/2020   MCV 91.2 09/23/2019   PLT 166.0 09/23/2019   Lab Results  Component Value Date   CREATININE 0.80 07/30/2020   BUN 16 07/30/2020   NA 142 07/30/2020   K 4.4 07/30/2020   CL 105 07/30/2020   CO2 28 05/06/2020   Lab Results  Component Value Date   ALT 26 05/06/2020   AST 17 05/06/2020   ALKPHOS 64 05/06/2020   BILITOT 0.7 05/06/2020   Lab Results  Component Value Date   CHOL 141 05/06/2020   Lab Results  Component Value Date   HDL 43.20 05/06/2020   Lab Results  Component Value Date   LDLCALC 76 05/06/2020   Lab Results  Component Value Date   TRIG 110.0 05/06/2020   Lab Results  Component Value Date   CHOLHDL 3 05/06/2020   Lab Results  Component Value Date   PSA 1.36 07/04/2018   PSA 1.36 07/04/2018   PSA 1.95 06/21/2011    IMPRESSION AND PLAN:  1) HTN: stable, continue 50mg  qd toprol xl and 100mg  qd losartan. Lytes/cr today.  2) HLD: tolerating atorva 20 qd. FLP and hepatic panel today.  3) GERD.   Dietary mod recommended.  He said he'll do the best he can. Start pantoprazole 40mg  qd.  4) Chronically elevated Hb (17-18 range since 2009 at least), iron has never been checked. Checking iron level today.  No s/s of OSA. He does not smoke.  5) Preventative health: shingrix rx sent to his pharmacy today.  All vaccines otherwise UTD. Prostate ca screening is done by his urologist. Coloncopy due this year.  An After Visit Summary was printed and given to the patient.  FOLLOW UP: Return in about 6 months (around 05/12/2021) for annual CPE (fasting) +RCI.  Signed:  Crissie Sickles, MD           11/10/2020

## 2020-11-10 ENCOUNTER — Encounter: Payer: Self-pay | Admitting: Family Medicine

## 2020-11-10 ENCOUNTER — Other Ambulatory Visit: Payer: Self-pay

## 2020-11-10 ENCOUNTER — Ambulatory Visit (INDEPENDENT_AMBULATORY_CARE_PROVIDER_SITE_OTHER): Payer: Medicare Other | Admitting: Family Medicine

## 2020-11-10 VITALS — BP 121/74 | HR 77 | Temp 97.5°F | Wt 257.8 lb

## 2020-11-10 DIAGNOSIS — Z23 Encounter for immunization: Secondary | ICD-10-CM | POA: Diagnosis not present

## 2020-11-10 DIAGNOSIS — K219 Gastro-esophageal reflux disease without esophagitis: Secondary | ICD-10-CM | POA: Diagnosis not present

## 2020-11-10 DIAGNOSIS — D751 Secondary polycythemia: Secondary | ICD-10-CM | POA: Diagnosis not present

## 2020-11-10 DIAGNOSIS — Z0000 Encounter for general adult medical examination without abnormal findings: Secondary | ICD-10-CM

## 2020-11-10 DIAGNOSIS — E7800 Pure hypercholesterolemia, unspecified: Secondary | ICD-10-CM

## 2020-11-10 DIAGNOSIS — I10 Essential (primary) hypertension: Secondary | ICD-10-CM

## 2020-11-10 LAB — LIPID PANEL
Cholesterol: 124 mg/dL (ref 0–200)
HDL: 41.5 mg/dL (ref >39.00)
LDL Cholesterol: 61 mg/dL (ref 0–99)
NonHDL: 82.43
Total CHOL/HDL Ratio: 3
Triglycerides: 106 mg/dL (ref 0.0–149.0)
VLDL: 21.2 mg/dL (ref 0.0–40.0)

## 2020-11-10 LAB — CBC WITH DIFFERENTIAL/PLATELET
Basophils Absolute: 0.1 10*3/uL (ref 0.0–0.1)
Basophils Relative: 1.1 % (ref 0.0–3.0)
Eosinophils Absolute: 0.2 10*3/uL (ref 0.0–0.7)
Eosinophils Relative: 2.5 % (ref 0.0–5.0)
HCT: 52.1 % — ABNORMAL HIGH (ref 39.0–52.0)
Hemoglobin: 17.4 g/dL — ABNORMAL HIGH (ref 13.0–17.0)
Lymphocytes Relative: 26.7 % (ref 12.0–46.0)
Lymphs Abs: 1.9 10*3/uL (ref 0.7–4.0)
MCHC: 33.4 g/dL (ref 30.0–36.0)
MCV: 91.1 fl (ref 78.0–100.0)
Monocytes Absolute: 0.9 10*3/uL (ref 0.1–1.0)
Monocytes Relative: 13 % — ABNORMAL HIGH (ref 3.0–12.0)
Neutro Abs: 3.9 10*3/uL (ref 1.4–7.7)
Neutrophils Relative %: 56.7 % (ref 43.0–77.0)
Platelets: 177 10*3/uL (ref 150.0–400.0)
RBC: 5.72 Mil/uL (ref 4.22–5.81)
RDW: 15.1 % (ref 11.5–15.5)
WBC: 6.9 10*3/uL (ref 4.0–10.5)

## 2020-11-10 LAB — COMPREHENSIVE METABOLIC PANEL
ALT: 18 U/L (ref 0–53)
AST: 16 U/L (ref 0–37)
Albumin: 4.1 g/dL (ref 3.5–5.2)
Alkaline Phosphatase: 72 U/L (ref 39–117)
BUN: 23 mg/dL (ref 6–23)
CO2: 24 mEq/L (ref 19–32)
Calcium: 9 mg/dL (ref 8.4–10.5)
Chloride: 104 mEq/L (ref 96–112)
Creatinine, Ser: 1.06 mg/dL (ref 0.40–1.50)
GFR: 70.33 mL/min (ref >60.00)
Glucose, Bld: 90 mg/dL (ref 70–99)
Potassium: 4.5 mEq/L (ref 3.5–5.1)
Sodium: 139 mEq/L (ref 135–145)
Total Bilirubin: 0.7 mg/dL (ref 0.2–1.2)
Total Protein: 6.8 g/dL (ref 6.0–8.3)

## 2020-11-10 MED ORDER — LOSARTAN POTASSIUM 100 MG PO TABS: 100.0000 mg | ORAL_TABLET | Freq: Every day | ORAL | 3 refills | Status: DC

## 2020-11-10 MED ORDER — ZOSTER VAC RECOMB ADJUVANTED 50 MCG/0.5ML IM SUSR: 0.5000 mL | Freq: Once | INTRAMUSCULAR | 0 refills | Status: AC

## 2020-11-10 MED ORDER — PANTOPRAZOLE SODIUM 40 MG PO TBEC: 40.0000 mg | DELAYED_RELEASE_TABLET | Freq: Every day | ORAL | 3 refills | Status: DC

## 2020-11-11 LAB — IRON,TIBC AND FERRITIN PANEL
%SAT: 40 % (calc) (ref 20–48)
Ferritin: 180 ng/mL (ref 24–380)
Iron: 111 ug/dL (ref 50–180)
TIBC: 280 mcg/dL (calc) (ref 250–425)

## 2020-11-11 LAB — ERYTHROPOIETIN: Erythropoietin: 6 m[IU]/mL (ref 2.6–18.5)

## 2020-11-15 ENCOUNTER — Encounter: Payer: Self-pay | Admitting: Family Medicine

## 2020-11-15 ENCOUNTER — Telehealth: Payer: Self-pay | Admitting: Family Medicine

## 2020-11-15 NOTE — Telephone Encounter (Signed)
Rx was sent on 6/22 for 90 d supply with 3 refills, [facility]. Pt was made aware

## 2020-11-15 NOTE — Telephone Encounter (Signed)
Pt called and said he needs refill on losartan (COZAAR) 100 MG tablet [facility] [address]. Pt said he is out

## 2020-11-16 ENCOUNTER — Telehealth: Payer: Self-pay | Admitting: Family Medicine

## 2020-11-16 NOTE — Telephone Encounter (Signed)
Spoke with patient he declined AWV do not call °

## 2020-12-27 ENCOUNTER — Other Ambulatory Visit: Payer: Self-pay | Admitting: Family Medicine

## 2021-01-12 DIAGNOSIS — M5136 Other intervertebral disc degeneration, lumbar region: Secondary | ICD-10-CM | POA: Diagnosis not present

## 2021-01-12 DIAGNOSIS — M9903 Segmental and somatic dysfunction of lumbar region: Secondary | ICD-10-CM | POA: Diagnosis not present

## 2021-01-13 DIAGNOSIS — M9903 Segmental and somatic dysfunction of lumbar region: Secondary | ICD-10-CM | POA: Diagnosis not present

## 2021-01-13 DIAGNOSIS — M5136 Other intervertebral disc degeneration, lumbar region: Secondary | ICD-10-CM | POA: Diagnosis not present

## 2021-01-17 DIAGNOSIS — M5136 Other intervertebral disc degeneration, lumbar region: Secondary | ICD-10-CM | POA: Diagnosis not present

## 2021-01-17 DIAGNOSIS — M9903 Segmental and somatic dysfunction of lumbar region: Secondary | ICD-10-CM | POA: Diagnosis not present

## 2021-01-19 DIAGNOSIS — M5136 Other intervertebral disc degeneration, lumbar region: Secondary | ICD-10-CM | POA: Diagnosis not present

## 2021-01-19 DIAGNOSIS — M9903 Segmental and somatic dysfunction of lumbar region: Secondary | ICD-10-CM | POA: Diagnosis not present

## 2021-01-21 ENCOUNTER — Other Ambulatory Visit: Payer: Self-pay | Admitting: Family Medicine

## 2021-01-25 DIAGNOSIS — M5136 Other intervertebral disc degeneration, lumbar region: Secondary | ICD-10-CM | POA: Diagnosis not present

## 2021-01-25 DIAGNOSIS — M9903 Segmental and somatic dysfunction of lumbar region: Secondary | ICD-10-CM | POA: Diagnosis not present

## 2021-01-27 DIAGNOSIS — M5136 Other intervertebral disc degeneration, lumbar region: Secondary | ICD-10-CM | POA: Diagnosis not present

## 2021-01-27 DIAGNOSIS — M9903 Segmental and somatic dysfunction of lumbar region: Secondary | ICD-10-CM | POA: Diagnosis not present

## 2021-02-01 DIAGNOSIS — M9903 Segmental and somatic dysfunction of lumbar region: Secondary | ICD-10-CM | POA: Diagnosis not present

## 2021-02-01 DIAGNOSIS — M5136 Other intervertebral disc degeneration, lumbar region: Secondary | ICD-10-CM | POA: Diagnosis not present

## 2021-02-02 ENCOUNTER — Other Ambulatory Visit: Payer: Self-pay | Admitting: Family Medicine

## 2021-02-03 DIAGNOSIS — M5136 Other intervertebral disc degeneration, lumbar region: Secondary | ICD-10-CM | POA: Diagnosis not present

## 2021-02-03 DIAGNOSIS — M9903 Segmental and somatic dysfunction of lumbar region: Secondary | ICD-10-CM | POA: Diagnosis not present

## 2021-02-09 DIAGNOSIS — M9903 Segmental and somatic dysfunction of lumbar region: Secondary | ICD-10-CM | POA: Diagnosis not present

## 2021-02-09 DIAGNOSIS — M5136 Other intervertebral disc degeneration, lumbar region: Secondary | ICD-10-CM | POA: Diagnosis not present

## 2021-02-16 DIAGNOSIS — M5136 Other intervertebral disc degeneration, lumbar region: Secondary | ICD-10-CM | POA: Diagnosis not present

## 2021-02-16 DIAGNOSIS — M9903 Segmental and somatic dysfunction of lumbar region: Secondary | ICD-10-CM | POA: Diagnosis not present

## 2021-02-21 DIAGNOSIS — M9903 Segmental and somatic dysfunction of lumbar region: Secondary | ICD-10-CM | POA: Diagnosis not present

## 2021-02-21 DIAGNOSIS — M5136 Other intervertebral disc degeneration, lumbar region: Secondary | ICD-10-CM | POA: Diagnosis not present

## 2021-02-23 DIAGNOSIS — M9903 Segmental and somatic dysfunction of lumbar region: Secondary | ICD-10-CM | POA: Diagnosis not present

## 2021-02-23 DIAGNOSIS — M5136 Other intervertebral disc degeneration, lumbar region: Secondary | ICD-10-CM | POA: Diagnosis not present

## 2021-02-28 DIAGNOSIS — M5136 Other intervertebral disc degeneration, lumbar region: Secondary | ICD-10-CM | POA: Diagnosis not present

## 2021-02-28 DIAGNOSIS — M9903 Segmental and somatic dysfunction of lumbar region: Secondary | ICD-10-CM | POA: Diagnosis not present

## 2021-03-02 DIAGNOSIS — M5136 Other intervertebral disc degeneration, lumbar region: Secondary | ICD-10-CM | POA: Diagnosis not present

## 2021-03-02 DIAGNOSIS — M9903 Segmental and somatic dysfunction of lumbar region: Secondary | ICD-10-CM | POA: Diagnosis not present

## 2021-03-07 DIAGNOSIS — M9903 Segmental and somatic dysfunction of lumbar region: Secondary | ICD-10-CM | POA: Diagnosis not present

## 2021-03-07 DIAGNOSIS — M5136 Other intervertebral disc degeneration, lumbar region: Secondary | ICD-10-CM | POA: Diagnosis not present

## 2021-03-08 ENCOUNTER — Other Ambulatory Visit: Payer: Self-pay | Admitting: Family Medicine

## 2021-03-09 ENCOUNTER — Encounter: Payer: Self-pay | Admitting: Family Medicine

## 2021-03-09 ENCOUNTER — Ambulatory Visit (INDEPENDENT_AMBULATORY_CARE_PROVIDER_SITE_OTHER): Payer: Medicare Other | Admitting: Family Medicine

## 2021-03-09 ENCOUNTER — Other Ambulatory Visit: Payer: Self-pay

## 2021-03-09 VITALS — BP 117/69 | HR 90 | Temp 97.6°F | Ht 66.0 in | Wt 262.8 lb

## 2021-03-09 DIAGNOSIS — M549 Dorsalgia, unspecified: Secondary | ICD-10-CM

## 2021-03-09 DIAGNOSIS — Z23 Encounter for immunization: Secondary | ICD-10-CM

## 2021-03-09 NOTE — Progress Notes (Signed)
OFFICE VISIT  03/09/2021  CC:  Chief Complaint  Patient presents with   Back Pain    X2 months; Severe, hx of back issues but not to this extent. Has not taken anything for it but has been going to a chiropractor twice a week.    HPI:    Patient is a 70 y.o. male who presents for back pain. Patient states that about mid summer this year he was accidentally nudged off balance and fell on his sacral/coccyx area onto hard rock.  He did NOT hit his low back, mid back, neck or any other part of his body when he fell.  The pain was severe and persisted over a month so he went to the chiropractor.  Chiropractor worked on him and his sacral/gluteal pain resolved but he then started to have pain in the mid back.  He describes a general mild tightness around the midline along middle thoracic level that is there most of the time, and is particularly bothered by brief stabs of pain in the midline that lasts 1 to 2 seconds.  These brief stabs seem to incapacitate him briefly.  They are brought on by certain movements such as getting up from sitting and sometimes rotating his torso.  When he coughs or laughs it also makes it hurt.  If sitting still or if going to bed and sleeping through the night he does not hurt at all.  Gets up in the morning and feels great but then starts moving around and things happen again.  He takes diclofenac 75 mg twice a day on a routine basis for general osteoarthritis pain.  Review of systems: No neck or low back pain no leg numbness, tingling, or weakness. No fever, no night sweats, no abnormal weight loss. No rash.  No swelling joints, no myalgias.  Past Medical History:  Diagnosis Date   BPH with obstruction/lower urinary tract symptoms    +Hx of acute urinary retention/acute cystitis: responding fairly well to tamsulosin and finasteride as of 09/2016 urol f/u.  Urolift and bladd bx 02/2018-->doing great as of 07/2019 urol f/u.   Deafness in right ear    Erythrocytosis     stable dating back to 2009 (17-18 range). Iron normal. No s/s of OSA.  Epo normal.  Just following as long as stable as of 10/2020 (no hem/onc ref)   Gross hematuria    hematuria protocol w/u being done by urol as of their 01/23/18 note.   History of back injury    Back fracture fell from tree   History of erectile dysfunction    History of fracture of right ankle 05/2015   History of kidney stones    History of staph infection 10 yrs ago   after back surgery   HTN (hypertension)    Hyperlipidemia    Low back pain    Microhematuria    chronic; pt gets periodic cystoscopy for monitoring, most recent 07/30/20-->bx showed benign urothelium and submucosa, with eosinophilic cystitis.   Nephrolithiasis 2015   CT showed 2cm left sided stone which was stable since 2006 imaging and likely in renal parenchyma.  2.5 cm as of 08/2017 neph f/u: renal u/s 12/2017-->no hydro. KUB showed stable L sided 18 mm stone. Cystoscopy 12/2017 erythematous mucosa at dome, no mass.    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  Annotation: previous methicillin sensitive  staphylococcus aureus 2006 Qualifier: Diagnosis of  By: Johnnye Sima MD, Dellis Filbert  (ID Specialist)     Prostate cancer screening    via Urol--> PSA 1.39 April 2019. 1.36 Feb 2020.   Tinnitus, left ear     Past Surgical History:  Procedure Laterality Date   COLONOSCOPY  10/12/2015   repeat 5 years   COLONOSCOPY W/ POLYPECTOMY  2007   Negative 2012 and 2017; Dr Ollen Barges 2022.   CYSTOSCOPY     with bladder biopsy:  dome of bladder with an area of chronic inflammation.  No malignancy.   CYSTOSCOPY WITH BIOPSY N/A 02/19/2018   Procedure: CYSTOSCOPY WITH BIOPSY/ FULGURATION / UROLIFT;  Surgeon: Festus Aloe, MD;  Location: [facility];  Service: Urology;  Laterality: N/A;   CYSTOSCOPY WITH FULGERATION N/A 07/30/2020   Bx benign. Procedure: CYSTOSCOPY WITH BLADDER  BIOPSY/FULGERATION;  Surgeon: Festus Aloe, MD;  Location: [facility];  Service: Urology;  Laterality: N/A;   CYSTOSCOPY WITH INSERTION OF UROLIFT  02/19/2018   LUMBAR LAMINECTOMY     Dr Vertell Limber performed 2nd & 3rd procedures (pt also has hx of fall and vertebral fracture years prior to his surgeries.   TONSILLECTOMY  as child    Outpatient Medications Prior to Visit  Medication Sig Dispense Refill   Ascorbic Acid (VITAMIN C) 1000 MG tablet Take 1,000 mg by mouth at bedtime.     atorvastatin (LIPITOR) 20 MG tablet TAKE 1 TABLET BY MOUTH EVERY DAY 90 tablet 0   diclofenac (VOLTAREN) 75 MG EC tablet TAKE 1 TABLET BY MOUTH TWICE A DAY AS NEEDED FOR ARTHRITIS PAIN. 60 tablet 2   finasteride (PROSCAR) 5 MG tablet TAKE 1 TABLET BY MOUTH EVERYDAY AT BEDTIME 90 tablet 0   losartan (COZAAR) 100 MG tablet Take 1 tablet (100 mg total) by mouth daily. 90 tablet 3   metoprolol succinate (TOPROL-XL) 50 MG 24 hr tablet TAKE 1 TABLET BY MOUTH DAILY. TAKE WITH OR IMMEDIATELY FOLLOWING A MEAL. 90 tablet 1   pantoprazole (PROTONIX) 40 MG tablet Take 1 tablet (40 mg total) by mouth daily. 90 tablet 3   tamsulosin (FLOMAX) 0.4 MG CAPS capsule Take 1 capsule (0.4 mg total) by mouth daily. (Patient taking differently: Take 0.4 mg by mouth at bedtime.) 90 capsule 3   No facility-administered medications prior to visit.    No Known Allergies  ROS As per HPI  PE: Vitals with BMI 03/09/2021 11/10/2020 07/30/2020  Height 5\' 6"  - -  Weight 262 lbs 13 oz 257 lbs 13 oz -  BMI 54.56 - -  Systolic 256 389 373  Diastolic 69 74 73  Pulse 90 77 -     General: Alert and oriented x4, well-appearing. Affect: Pleasant, lucid thought and speech. Back: No tenderness, deformity, bruising, or rash.  He says the general region of pain is over the midline and paraspinous facet region about levels T5-T10.  No bruising, no rash. Range of motion of the back is fully intact.  However he has to do forward  flexion very slowly or he gets that catch in the previously mentioned area.  LABS:    Chemistry      Component Value Date/Time   NA 139 11/10/2020 0827   K 4.5 11/10/2020 0827   CL 104 11/10/2020 0827   CO2 24 11/10/2020 0827   BUN 23 11/10/2020 0827   CREATININE 1.06 11/10/2020 0827   CREATININE 1.12 01/01/2013 1604      Component Value Date/Time   CALCIUM 9.0 11/10/2020 0827   ALKPHOS 72 11/10/2020  0827   AST 16 11/10/2020 0827   ALT 18 11/10/2020 0827   BILITOT 0.7 11/10/2020 0827      IMPRESSION AND PLAN:  Musculoskeletal midline thoracic back pain. Physical therapy recommended, patient was open to this and I ordered it today. He will continue his usual diclofenac 75 mg twice a day. I do not feel that any imaging is indicated at this time.  An After Visit Summary was printed and given to the patient.  FOLLOW UP: No follow-ups on file.  Signed:  Crissie Sickles, MD           03/09/2021

## 2021-03-16 DIAGNOSIS — S335XXA Sprain of ligaments of lumbar spine, initial encounter: Secondary | ICD-10-CM | POA: Diagnosis not present

## 2021-03-21 DIAGNOSIS — S335XXA Sprain of ligaments of lumbar spine, initial encounter: Secondary | ICD-10-CM | POA: Diagnosis not present

## 2021-03-25 DIAGNOSIS — S335XXA Sprain of ligaments of lumbar spine, initial encounter: Secondary | ICD-10-CM | POA: Diagnosis not present

## 2021-03-29 DIAGNOSIS — S335XXA Sprain of ligaments of lumbar spine, initial encounter: Secondary | ICD-10-CM | POA: Diagnosis not present

## 2021-04-01 ENCOUNTER — Other Ambulatory Visit: Payer: Self-pay | Admitting: Family Medicine

## 2021-04-01 DIAGNOSIS — S335XXA Sprain of ligaments of lumbar spine, initial encounter: Secondary | ICD-10-CM | POA: Diagnosis not present

## 2021-04-05 DIAGNOSIS — S335XXA Sprain of ligaments of lumbar spine, initial encounter: Secondary | ICD-10-CM | POA: Diagnosis not present

## 2021-04-07 DIAGNOSIS — S335XXA Sprain of ligaments of lumbar spine, initial encounter: Secondary | ICD-10-CM | POA: Diagnosis not present

## 2021-04-12 DIAGNOSIS — S335XXA Sprain of ligaments of lumbar spine, initial encounter: Secondary | ICD-10-CM | POA: Diagnosis not present

## 2021-04-18 DIAGNOSIS — S335XXA Sprain of ligaments of lumbar spine, initial encounter: Secondary | ICD-10-CM | POA: Diagnosis not present

## 2021-04-20 DIAGNOSIS — S335XXA Sprain of ligaments of lumbar spine, initial encounter: Secondary | ICD-10-CM | POA: Diagnosis not present

## 2021-04-24 ENCOUNTER — Other Ambulatory Visit: Payer: Self-pay | Admitting: Family Medicine

## 2021-04-25 ENCOUNTER — Other Ambulatory Visit: Payer: Self-pay | Admitting: Family Medicine

## 2021-04-25 DIAGNOSIS — S335XXA Sprain of ligaments of lumbar spine, initial encounter: Secondary | ICD-10-CM | POA: Diagnosis not present

## 2021-04-30 ENCOUNTER — Other Ambulatory Visit: Payer: Self-pay | Admitting: Family Medicine

## 2021-05-02 ENCOUNTER — Other Ambulatory Visit: Payer: Self-pay

## 2021-05-02 ENCOUNTER — Encounter: Payer: Self-pay | Admitting: Family Medicine

## 2021-05-02 ENCOUNTER — Ambulatory Visit (INDEPENDENT_AMBULATORY_CARE_PROVIDER_SITE_OTHER): Payer: Medicare Other | Admitting: Family Medicine

## 2021-05-02 VITALS — BP 125/78 | HR 83 | Temp 97.6°F | Ht 66.0 in | Wt 258.0 lb

## 2021-05-02 DIAGNOSIS — I10 Essential (primary) hypertension: Secondary | ICD-10-CM | POA: Diagnosis not present

## 2021-05-02 DIAGNOSIS — E7800 Pure hypercholesterolemia, unspecified: Secondary | ICD-10-CM

## 2021-05-02 DIAGNOSIS — D751 Secondary polycythemia: Secondary | ICD-10-CM | POA: Diagnosis not present

## 2021-05-02 LAB — COMPREHENSIVE METABOLIC PANEL
ALT: 16 U/L (ref 0–53)
AST: 17 U/L (ref 0–37)
Albumin: 4 g/dL (ref 3.5–5.2)
Alkaline Phosphatase: 80 U/L (ref 39–117)
BUN: 19 mg/dL (ref 6–23)
CO2: 26 mEq/L (ref 19–32)
Calcium: 8.9 mg/dL (ref 8.4–10.5)
Chloride: 103 mEq/L (ref 96–112)
Creatinine, Ser: 0.94 mg/dL (ref 0.40–1.50)
GFR: 80.97 mL/min (ref >60.00)
Glucose, Bld: 91 mg/dL (ref 70–99)
Potassium: 4.2 mEq/L (ref 3.5–5.1)
Sodium: 140 mEq/L (ref 135–145)
Total Bilirubin: 0.8 mg/dL (ref 0.2–1.2)
Total Protein: 6.8 g/dL (ref 6.0–8.3)

## 2021-05-02 LAB — CBC WITH DIFFERENTIAL/PLATELET
Basophils Absolute: 0 10*3/uL (ref 0.0–0.1)
Basophils Relative: 0.4 % (ref 0.0–3.0)
Eosinophils Absolute: 0.1 10*3/uL (ref 0.0–0.7)
Eosinophils Relative: 2.3 % (ref 0.0–5.0)
HCT: 52.8 % — ABNORMAL HIGH (ref 39.0–52.0)
Hemoglobin: 17.6 g/dL — ABNORMAL HIGH (ref 13.0–17.0)
Lymphocytes Relative: 32.8 % (ref 12.0–46.0)
Lymphs Abs: 1.6 10*3/uL (ref 0.7–4.0)
MCHC: 33.4 g/dL (ref 30.0–36.0)
MCV: 89.6 fl (ref 78.0–100.0)
Monocytes Absolute: 0.8 10*3/uL (ref 0.1–1.0)
Monocytes Relative: 15.3 % — ABNORMAL HIGH (ref 3.0–12.0)
Neutro Abs: 2.5 10*3/uL (ref 1.4–7.7)
Neutrophils Relative %: 49.2 % (ref 43.0–77.0)
Platelets: 154 10*3/uL (ref 150.0–400.0)
RBC: 5.89 Mil/uL — ABNORMAL HIGH (ref 4.22–5.81)
RDW: 14.6 % (ref 11.5–15.5)
WBC: 5 10*3/uL (ref 4.0–10.5)

## 2021-05-02 LAB — LIPID PANEL
Cholesterol: 133 mg/dL (ref 0–200)
HDL: 32.6 mg/dL — ABNORMAL LOW (ref >39.00)
LDL Cholesterol: 80 mg/dL (ref 0–99)
NonHDL: 100.12
Total CHOL/HDL Ratio: 4
Triglycerides: 99 mg/dL (ref 0.0–149.0)
VLDL: 19.8 mg/dL (ref 0.0–40.0)

## 2021-05-02 MED ORDER — METOPROLOL SUCCINATE ER 50 MG PO TB24: 50.0000 mg | ORAL_TABLET | Freq: Every day | ORAL | 3 refills | Status: DC

## 2021-05-02 MED ORDER — LOSARTAN POTASSIUM 100 MG PO TABS: 100.0000 mg | ORAL_TABLET | Freq: Every day | ORAL | 3 refills | Status: DC

## 2021-05-02 MED ORDER — ATORVASTATIN CALCIUM 20 MG PO TABS: 20.0000 mg | ORAL_TABLET | Freq: Every day | ORAL | 3 refills | Status: DC

## 2021-05-02 NOTE — Progress Notes (Signed)
OFFICE VISIT  05/02/2021  CC:  Chief Complaint  Patient presents with   Follow-up    RCI; pt is fasting   HPI:    Patient is a 70 y.o. male who presents for 6 mo f/u HTN and HLD. A/P as of last visit: "1) HTN: stable, continue 50mg  qd toprol xl and 100mg  qd losartan. Lytes/cr today.   2) HLD: tolerating atorva 20 qd. FLP and hepatic panel today.   3) GERD.  Dietary mod recommended.  He said he'll do the best he can. Start pantoprazole 40mg  qd.   4) Chronically elevated Hb (17-18 range since 2009 at least), iron has never been checked. Checking iron level today.  No s/s of OSA. He does not smoke.   5) Preventative health: shingrix rx sent to his pharmacy today.  All vaccines otherwise UTD. Prostate ca screening is done by his urologist. Coloncopy due this year."  INTERIM HX: Henrene Pastor says he is doing well. He has been doing PT for his back and says this has been very helpful.  No home blood pressure monitoring data.  He is compliant with his medications.  He has had upper respiratory symptoms with a rattly cough for about the last week but this has been getting better.  No fever or shortness of breath or wheezing.  ROS as above, plus-->  no dizziness, no HAs, no rashes, no melena/hematochezia.  No polyuria or polydipsia.  No myalgias or arthralgias.  No focal weakness, paresthesias, or tremors.  No acute vision or hearing abnormalities.  No dysuria or unusual/new urinary urgency or frequency.  No recent changes in lower legs. No n/v/d or abd pain.  No palpitations.     Past Medical History:  Diagnosis Date   BPH with obstruction/lower urinary tract symptoms    +Hx of acute urinary retention/acute cystitis: responding fairly well to tamsulosin and finasteride as of 09/2016 urol f/u.  Urolift and bladd bx 02/2018-->doing great as of 07/2019 urol f/u.   Deafness in right ear    Erythrocytosis    stable dating back to 2009 (17-18 range). Iron normal. No s/s of OSA.  Epo  normal.  Just following as long as stable as of 10/2020 (no hem/onc ref)   Gross hematuria    hematuria protocol w/u being done by urol as of their 01/23/18 note.   History of back injury    Back fracture fell from tree   History of erectile dysfunction    History of fracture of right ankle 05/2015   History of kidney stones    History of staph infection 10 yrs ago   after back surgery   HTN (hypertension)    Hyperlipidemia    Low back pain    Microhematuria    chronic; pt gets periodic cystoscopy for monitoring, most recent 07/30/20-->bx showed benign urothelium and submucosa, with eosinophilic cystitis.   Nephrolithiasis 2015   CT showed 2cm left sided stone which was stable since 2006 imaging and likely in renal parenchyma.  2.5 cm as of 08/2017 neph f/u: renal u/s 12/2017-->no hydro. KUB showed stable L sided 18 mm stone. Cystoscopy 12/2017 erythematous mucosa at dome, no mass.    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  Annotation: previous methicillin sensitive  staphylococcus aureus 2006 Qualifier: Diagnosis of  By: Johnnye Sima MD, Dellis Filbert  (ID Specialist)     Prostate cancer screening    via Urol--> PSA 1.39 April  2019. 1.36 Feb 2020.   Tinnitus, left ear     Past Surgical History:  Procedure Laterality Date   COLONOSCOPY  10/12/2015   repeat 5 years   COLONOSCOPY W/ POLYPECTOMY  2007   Negative 2012 and 2017; Dr Ollen Barges 2022.   CYSTOSCOPY     with bladder biopsy:  dome of bladder with an area of chronic inflammation.  No malignancy.   CYSTOSCOPY WITH BIOPSY N/A 02/19/2018   Procedure: CYSTOSCOPY WITH BIOPSY/ FULGURATION / UROLIFT;  Surgeon: Festus Aloe, MD;  Location: [facility];  Service: Urology;  Laterality: N/A;   CYSTOSCOPY WITH FULGERATION N/A 07/30/2020   Bx benign. Procedure: CYSTOSCOPY WITH BLADDER BIOPSY/FULGERATION;  Surgeon: Festus Aloe, MD;  Location: [facility];  Service: Urology;  Laterality: N/A;   CYSTOSCOPY WITH INSERTION OF UROLIFT  02/19/2018   LUMBAR LAMINECTOMY     Dr Vertell Limber performed 2nd & 3rd procedures (pt also has hx of fall and vertebral fracture years prior to his surgeries.   TONSILLECTOMY  as child    Outpatient Medications Prior to Visit  Medication Sig Dispense Refill   Ascorbic Acid (VITAMIN C) 1000 MG tablet Take 1,000 mg by mouth at bedtime.     diclofenac (VOLTAREN) 75 MG EC tablet TAKE 1 TABLET BY MOUTH TWICE A DAY AS NEEDED FOR ARTHRITIS PAIN. 60 tablet 2   finasteride (PROSCAR) 5 MG tablet TAKE 1 TABLET BY MOUTH EVERYDAY AT BEDTIME 90 tablet 0   pantoprazole (PROTONIX) 40 MG tablet Take 1 tablet (40 mg total) by mouth daily. 90 tablet 3   tamsulosin (FLOMAX) 0.4 MG CAPS capsule Take 1 capsule (0.4 mg total) by mouth daily. (Patient taking differently: Take 0.4 mg by mouth at bedtime.) 90 capsule 3   atorvastatin (LIPITOR) 20 MG tablet TAKE 1 TABLET BY MOUTH EVERY DAY 30 tablet 0   losartan (COZAAR) 100 MG tablet Take 1 tablet (100 mg total) by mouth daily. 90 tablet 3   metoprolol succinate (TOPROL-XL) 50 MG 24 hr tablet TAKE 1 TABLET BY MOUTH DAILY. TAKE WITH OR IMMEDIATELY FOLLOWING A MEAL. 90 tablet 1   No facility-administered medications prior to visit.    No Known Allergies  ROS As per HPI  PE: Vitals with BMI 05/02/2021 03/09/2021 11/10/2020  Height 5\' 6"  5\' 6"  -  Weight 258 lbs 262 lbs 13 oz 257 lbs 13 oz  BMI 50.53 97.67 -  Systolic 341 937 902  Diastolic 78 69 74  Pulse 83 90 77   VS: noted--normal. Gen: alert, NAD, NONTOXIC APPEARING. HEENT: eyes without injection, drainage, or swelling.  Ears: EACs clear, TMs with normal light reflex and landmarks.  Nose: Clear rhinorrhea, with some dried, crusty exudate adherent to mildly edematous mucosa.  No purulent d/c.  No paranasal sinus TTP.  No facial swelling.  Throat and mouth without focal lesion.  No pharyngial swelling, erythema, or exudate.    Neck: supple, no LAD.   LUNGS: CTA bilat, nonlabored resps.   CV: RRR, no m/r/g.   LABS:  Lab Results  Component Value Date   TSH 1.40 07/05/2015   Lab Results  Component Value Date   WBC 6.9 11/10/2020   HGB 17.4 (H) 11/10/2020   HCT 52.1 (H) 11/10/2020   MCV 91.1 11/10/2020   PLT 177.0 11/10/2020   Lab Results  Component Value Date   IRON 111 11/10/2020   TIBC 280 11/10/2020   FERRITIN 180 11/10/2020    Lab Results  Component Value Date  CREATININE 1.06 11/10/2020   BUN 23 11/10/2020   NA 139 11/10/2020   K 4.5 11/10/2020   CL 104 11/10/2020   CO2 24 11/10/2020   Lab Results  Component Value Date   ALT 18 11/10/2020   AST 16 11/10/2020   ALKPHOS 72 11/10/2020   BILITOT 0.7 11/10/2020   Lab Results  Component Value Date   CHOL 124 11/10/2020   Lab Results  Component Value Date   HDL 41.50 11/10/2020   Lab Results  Component Value Date   LDLCALC 61 11/10/2020   Lab Results  Component Value Date   TRIG 106.0 11/10/2020   Lab Results  Component Value Date   CHOLHDL 3 11/10/2020   Lab Results  Component Value Date   PSA 1.36 07/04/2018   PSA 1.36 07/04/2018   PSA 1.95 06/21/2011    IMPRESSION AND PLAN:  1) HTN: well controlled on losartan 100 qd and toprol xl 50 qd. Lytes/cr today.  2) HLD: cont atorva 20 qd. FLP and hepatic panel today.  3) Erythrocytosis: table dating back to 2009 (17-18 range). Iron normal. No s/s of OSA.  Epo normal.  Just following as long as stable as of 10/2020 CBC today.  4) URI with cough/congestion: resolving appropriately.  5) Musculoskeletal mid back pain---greatly improved with PT, which he finishes up with this week.  6) Preventative health: Vaccines: ALL UTD. Prostate ca screening is done by his urologist. Coloncopy 5 yr recall due at any time now.  An After Visit Summary was printed and given to the patient.  FOLLOW UP: Return in about 6 months (around 10/31/2021) for routine chronic illness  f/u.  Signed:  Crissie Sickles, MD           05/02/2021

## 2021-05-03 DIAGNOSIS — S335XXA Sprain of ligaments of lumbar spine, initial encounter: Secondary | ICD-10-CM | POA: Diagnosis not present

## 2021-05-17 ENCOUNTER — Telehealth: Payer: Self-pay | Admitting: Family Medicine

## 2021-05-17 MED ORDER — AZITHROMYCIN 250 MG PO TABS: ORAL_TABLET | ORAL | 0 refills | Status: DC

## 2021-05-17 MED ORDER — PREDNISONE 20 MG PO TABS: ORAL_TABLET | ORAL | 0 refills | Status: DC

## 2021-05-17 NOTE — Telephone Encounter (Signed)
Pt called about cough has gotten worst, since the last time we saw him 05/02/21. He's wanting something for his cough. --KR

## 2021-05-17 NOTE — Telephone Encounter (Signed)
Prednisone and z-pack eRx'd. Needs o/v if not improved significantly in 1 wk

## 2021-05-17 NOTE — Telephone Encounter (Signed)
Mentioned in OV note: He has had upper respiratory symptoms with a rattly cough for about the last week but this has been getting better.  No fever or shortness of breath or wheezing.

## 2021-05-18 NOTE — Telephone Encounter (Signed)
Pt advised of recommendations.  

## 2021-05-18 NOTE — Telephone Encounter (Signed)
LM for pt regarding rx instructions

## 2021-05-24 NOTE — Telephone Encounter (Signed)
Pt scheduled for 05/25/21

## 2021-05-24 NOTE — Telephone Encounter (Signed)
Pt calling about coughing up congestion. Feels like feather in his throat. Was told to call back in 5 days.--KR

## 2021-05-25 ENCOUNTER — Ambulatory Visit: Payer: Medicare Other | Admitting: Family Medicine

## 2021-05-25 ENCOUNTER — Ambulatory Visit (INDEPENDENT_AMBULATORY_CARE_PROVIDER_SITE_OTHER): Payer: Medicare Other | Admitting: Family Medicine

## 2021-05-25 ENCOUNTER — Other Ambulatory Visit: Payer: Self-pay

## 2021-05-25 ENCOUNTER — Encounter: Payer: Self-pay | Admitting: Family Medicine

## 2021-05-25 VITALS — BP 111/63 | HR 79 | Temp 97.3°F | Ht 66.0 in | Wt 263.4 lb

## 2021-05-25 DIAGNOSIS — J209 Acute bronchitis, unspecified: Secondary | ICD-10-CM

## 2021-05-25 DIAGNOSIS — J069 Acute upper respiratory infection, unspecified: Secondary | ICD-10-CM

## 2021-05-25 MED ORDER — ALBUTEROL SULFATE HFA 108 (90 BASE) MCG/ACT IN AERS: 2.0000 | INHALATION_SPRAY | Freq: Four times a day (QID) | RESPIRATORY_TRACT | 0 refills | Status: DC | PRN

## 2021-05-25 MED ORDER — PREDNISONE 10 MG PO TABS: ORAL_TABLET | ORAL | 0 refills | Status: DC

## 2021-05-25 NOTE — Progress Notes (Signed)
OFFICE VISIT  05/25/2021  CC:  Chief Complaint  Patient presents with   Cough    Still has productive cough with clear phlegm; has finished prednisone rx   HPI:    Patient is a 71 y.o. male who presents for cough.  HPI: I last saw him on 05/02/2021, he had mentioned recent cold with cough at that time and said it had been getting better.  No medications prescribed. On 05/17/2021 he called back stating his cough had continued to be an issue so I prescribed azithromycin and prednisone at that time. Derrick [name] says his cough is ongoing, particularly in the daytime, hears a chest rattle and occasional wheeze.  URI symptoms much improved. Occasionally has coughing fit where he feels like he has trouble catching his breath. No chest pain, no fever.  Past Medical History:  Diagnosis Date   BPH with obstruction/lower urinary tract symptoms    +Hx of acute urinary retention/acute cystitis: responding fairly well to tamsulosin and finasteride as of 09/2016 urol f/u.  Urolift and bladd bx 02/2018-->doing great as of 07/2019 urol f/u.   Deafness in right ear    Erythrocytosis    stable dating back to 2009 (17-18 range). Iron normal. No s/s of OSA.  Epo normal.  Just following as long as stable as of 10/2020 (no hem/onc ref)   Gross hematuria    hematuria protocol w/u being done by urol as of their 01/23/18 note.   History of back injury    Back fracture fell from tree   History of erectile dysfunction    History of fracture of right ankle 05/2015   History of kidney stones    History of staph infection 10 yrs ago   after back surgery   HTN (hypertension)    Hyperlipidemia    Low back pain    Microhematuria    chronic; pt gets periodic cystoscopy for monitoring, most recent 07/30/20-->bx showed benign urothelium and submucosa, with eosinophilic cystitis.   Nephrolithiasis 2015   CT showed 2cm left sided stone which was stable since 2006 imaging and likely in renal parenchyma.  2.5 cm as of 08/2017  neph f/u: renal u/s 12/2017-->no hydro. KUB showed stable L sided 18 mm stone. Cystoscopy 12/2017 erythematous mucosa at dome, no mass.    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  Annotation: previous methicillin sensitive  staphylococcus aureus 2006 Qualifier: Diagnosis of  By: Johnnye Sima MD, Dellis Filbert  (ID Specialist)     Prostate cancer screening    via Urol--> PSA 1.39 April 2019. 1.36 Feb 2020.   Tinnitus, left ear     Past Surgical History:  Procedure Laterality Date   COLONOSCOPY  10/12/2015   repeat 5 years   COLONOSCOPY W/ POLYPECTOMY  2007   Negative 2012 and 2017; Dr Ollen Barges 2022.   CYSTOSCOPY     with bladder biopsy:  dome of bladder with an area of chronic inflammation.  No malignancy.   CYSTOSCOPY WITH BIOPSY N/A 02/19/2018   Procedure: CYSTOSCOPY WITH BIOPSY/ FULGURATION / UROLIFT;  Surgeon: Festus Aloe, MD;  Location: [facility];  Service: Urology;  Laterality: N/A;   CYSTOSCOPY WITH FULGERATION N/A 07/30/2020   Bx benign. Procedure: CYSTOSCOPY WITH BLADDER BIOPSY/FULGERATION;  Surgeon: Festus Aloe, MD;  Location: [facility];  Service: Urology;  Laterality: N/A;   CYSTOSCOPY WITH INSERTION OF UROLIFT  02/19/2018   LUMBAR LAMINECTOMY     Dr  Vertell Limber performed 2nd & 3rd procedures (pt also has hx of fall and vertebral fracture years prior to his surgeries.   TONSILLECTOMY  as child    Outpatient Medications Prior to Visit  Medication Sig Dispense Refill   Ascorbic Acid (VITAMIN C) 1000 MG tablet Take 1,000 mg by mouth at bedtime.     atorvastatin (LIPITOR) 20 MG tablet Take 1 tablet (20 mg total) by mouth daily. 90 tablet 3   diclofenac (VOLTAREN) 75 MG EC tablet TAKE 1 TABLET BY MOUTH TWICE A DAY AS NEEDED FOR ARTHRITIS PAIN. 60 tablet 2   finasteride (PROSCAR) 5 MG tablet TAKE 1 TABLET BY MOUTH EVERYDAY AT BEDTIME 90 tablet 0   losartan (COZAAR) 100 MG tablet Take 1  tablet (100 mg total) by mouth daily. 90 tablet 3   metoprolol succinate (TOPROL-XL) 50 MG 24 hr tablet Take 1 tablet (50 mg total) by mouth daily. TAKE WITH OR IMMEDIATELY FOLLOWING A MEAL. 90 tablet 3   pantoprazole (PROTONIX) 40 MG tablet Take 1 tablet (40 mg total) by mouth daily. 90 tablet 3   tamsulosin (FLOMAX) 0.4 MG CAPS capsule TAKE 1 CAPSULE BY MOUTH EVERY DAY 90 capsule 1   azithromycin (ZITHROMAX) 250 MG tablet 2 tabs po qd x 1d, then 1 tab po qd x 4d (Patient not taking: Reported on 05/25/2021) 6 tablet 0   predniSONE (DELTASONE) 20 MG tablet 2 tabs po qd x 5d (Patient not taking: Reported on 05/25/2021) 10 tablet 0   No facility-administered medications prior to visit.    No Known Allergies  ROS As per HPI  PE: Vitals with BMI 05/25/2021 05/02/2021 03/09/2021  Height 5\' 6"  5\' 6"  5\' 6"   Weight 263 lbs 6 oz 258 lbs 262 lbs 13 oz  BMI 42.53 13.24 40.10  Systolic 272 536 644  Diastolic 63 78 69  Pulse 79 83 90  02 sat 95% on RA today  Physical Exam  Gen: Alert, well appearing.  Patient is oriented to person, place, time, and situation. AFFECT: pleasant, lucid thought and speech. IHK:VQQV: no injection, icteris, swelling, or exudate.  EOMI, PERRLA. Mouth: lips without lesion/swelling.  Oral mucosa pink and moist. Oropharynx without erythema, exudate, or swelling.  CV: RRR, no m/r/g.   LUNGS: CTA bilat, nonlabored resps, good aeration in all lung fields. EXT: no clubbing or cyanosis.  no edema.    LABS:  Last CBC Lab Results  Component Value Date   WBC 5.0 05/02/2021   HGB 17.6 (H) 05/02/2021   HCT 52.8 (H) 05/02/2021   MCV 89.6 05/02/2021   MCH 30.8 02/14/2018   RDW 14.6 05/02/2021   PLT 154.0 95/63/8756   Last metabolic panel Lab Results  Component Value Date   GLUCOSE 91 05/02/2021   NA 140 05/02/2021   K 4.2 05/02/2021   CL 103 05/02/2021   CO2 26 05/02/2021   BUN 19 05/02/2021   CREATININE 0.94 05/02/2021   GFRNONAA >60 06/05/2017   CALCIUM 8.9  05/02/2021   PHOS 2.6 06/19/2012   PROT 6.8 05/02/2021   ALBUMIN 4.0 05/02/2021   BILITOT 0.8 05/02/2021   ALKPHOS 80 05/02/2021   AST 17 05/02/2021   ALT 16 05/02/2021   ANIONGAP 6 06/05/2017   IMPRESSION AND PLAN:  Acute bronchitis.  He did have a little improvement in things with a Z-Pak and prednisone. Will do taper of prednisone: 40 qd x 3d, 30 qd x 3d, 20 qd x 3d, 10 qd x 3d. Also albuterol HFA trial, 1-2  p q4h prn.  An After Visit Summary was printed and given to the patient.  FOLLOW UP: Return if symptoms worsen or fail to improve.  Signed:  Crissie Sickles, MD           05/25/2021

## 2021-06-10 ENCOUNTER — Other Ambulatory Visit: Payer: Self-pay | Admitting: Family Medicine

## 2021-06-15 ENCOUNTER — Other Ambulatory Visit: Payer: Self-pay

## 2021-06-15 ENCOUNTER — Encounter: Payer: Self-pay | Admitting: Family Medicine

## 2021-06-15 ENCOUNTER — Ambulatory Visit (INDEPENDENT_AMBULATORY_CARE_PROVIDER_SITE_OTHER): Payer: Medicare Other | Admitting: Family Medicine

## 2021-06-15 VITALS — BP 128/76 | HR 90 | Temp 98.1°F | Ht 66.0 in | Wt 263.4 lb

## 2021-06-15 DIAGNOSIS — J0100 Acute maxillary sinusitis, unspecified: Secondary | ICD-10-CM | POA: Diagnosis not present

## 2021-06-15 DIAGNOSIS — J069 Acute upper respiratory infection, unspecified: Secondary | ICD-10-CM | POA: Diagnosis not present

## 2021-06-15 LAB — POC COVID19 BINAXNOW: SARS Coronavirus 2 Ag: NEGATIVE

## 2021-06-15 LAB — POCT INFLUENZA A/B
Influenza A, POC: NEGATIVE
Influenza B, POC: NEGATIVE

## 2021-06-15 MED ORDER — AMOXICILLIN-POT CLAVULANATE 875-125 MG PO TABS: 1.0000 | ORAL_TABLET | Freq: Two times a day (BID) | ORAL | 0 refills | Status: DC

## 2021-06-15 NOTE — Progress Notes (Signed)
OFFICE VISIT  06/15/2021  CC:  Chief Complaint  Patient presents with   Nasal Congestion   Sinus Pressure   HPI:    Patient is a 71 y.o. male who presents for nasal congestion and sinus pressure. I last saw him 05/25/21. A/P as of that visit: "Acute bronchitis.  He did have a little improvement in things with a Z-Pak and prednisone. Will do taper of prednisone: 40 qd x 3d, 30 qd x 3d, 20 qd x 3d, 10 qd x 3d. Also albuterol HFA trial, 1-2 p q4h prn."  INTERIM HX: Maki is still having significant upper respiratory symptoms.  His cough resolved with the prednisone and he is pleased. Right sided nasal congestion right maxillary pain and tenderness, right nasal passage mucus that is thick.  Some headache around the right orbital region.  No sore throat, no ear pain.  Vague upper teeth pain on the right. No fever.  Past Medical History:  Diagnosis Date   BPH with obstruction/lower urinary tract symptoms    +Hx of acute urinary retention/acute cystitis: responding fairly well to tamsulosin and finasteride as of 09/2016 urol f/u.  Urolift and bladd bx 02/2018-->doing great as of 07/2019 urol f/u.   Deafness in right ear    Erythrocytosis    stable dating back to 2009 (17-18 range). Iron normal. No s/s of OSA.  Epo normal.  Just following as long as stable as of 10/2020 (no hem/onc ref)   Gross hematuria    hematuria protocol w/u being done by urol as of their 01/23/18 note.   History of back injury    Back fracture fell from tree   History of erectile dysfunction    History of fracture of right ankle 05/2015   History of kidney stones    History of staph infection 10 yrs ago   after back surgery   HTN (hypertension)    Hyperlipidemia    Low back pain    Microhematuria    chronic; pt gets periodic cystoscopy for monitoring, most recent 07/30/20-->bx showed benign urothelium and submucosa, with eosinophilic cystitis.   Nephrolithiasis 2015   CT showed 2cm left sided stone which was stable  since 2006 imaging and likely in renal parenchyma.  2.5 cm as of 08/2017 neph f/u: renal u/s 12/2017-->no hydro. KUB showed stable L sided 18 mm stone. Cystoscopy 12/2017 erythematous mucosa at dome, no mass.    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  Annotation: previous methicillin sensitive  staphylococcus aureus 2006 Qualifier: Diagnosis of  By: Johnnye Sima MD, Dellis Filbert  (ID Specialist)     Prostate cancer screening    via Urol--> PSA 1.39 April 2019. 1.36 Feb 2020.   Tinnitus, left ear     Past Surgical History:  Procedure Laterality Date   COLONOSCOPY  10/12/2015   repeat 5 years   COLONOSCOPY W/ POLYPECTOMY  2007   Negative 2012 and 2017; Dr Ollen Barges 2022.   CYSTOSCOPY     with bladder biopsy:  dome of bladder with an area of chronic inflammation.  No malignancy.   CYSTOSCOPY WITH BIOPSY N/A 02/19/2018   Procedure: CYSTOSCOPY WITH BIOPSY/ FULGURATION / UROLIFT;  Surgeon: Festus Aloe, MD;  Location: [facility];  Service: Urology;  Laterality: N/A;   CYSTOSCOPY WITH FULGERATION N/A 07/30/2020   Bx benign. Procedure: CYSTOSCOPY WITH BLADDER BIOPSY/FULGERATION;  Surgeon: Festus Aloe, MD;  Location: [facility];  Service: Urology;  Laterality: N/A;   CYSTOSCOPY WITH INSERTION OF UROLIFT  02/19/2018   LUMBAR LAMINECTOMY     Dr Vertell Limber performed 2nd & 3rd procedures (pt also has hx of fall and vertebral fracture years prior to his surgeries.   TONSILLECTOMY  as child    Outpatient Medications Prior to Visit  Medication Sig Dispense Refill   Ascorbic Acid (VITAMIN C) 1000 MG tablet Take 1,000 mg by mouth at bedtime.     atorvastatin (LIPITOR) 20 MG tablet Take 1 tablet (20 mg total) by mouth daily. 90 tablet 3   diclofenac (VOLTAREN) 75 MG EC tablet TAKE 1 TABLET BY MOUTH TWICE A DAY AS NEEDED FOR ARTHRITIS PAIN. 60 tablet 2   finasteride (PROSCAR) 5 MG tablet TAKE 1 TABLET BY MOUTH  EVERYDAY AT BEDTIME 90 tablet 0   losartan (COZAAR) 100 MG tablet Take 1 tablet (100 mg total) by mouth daily. 90 tablet 3   metoprolol succinate (TOPROL-XL) 50 MG 24 hr tablet Take 1 tablet (50 mg total) by mouth daily. TAKE WITH OR IMMEDIATELY FOLLOWING A MEAL. 90 tablet 3   pantoprazole (PROTONIX) 40 MG tablet Take 1 tablet (40 mg total) by mouth daily. 90 tablet 3   tamsulosin (FLOMAX) 0.4 MG CAPS capsule TAKE 1 CAPSULE BY MOUTH EVERY DAY 90 capsule 1   albuterol (VENTOLIN HFA) 108 (90 Base) MCG/ACT inhaler Inhale 2 puffs into the lungs every 6 (six) hours as needed for wheezing or shortness of breath. (Patient not taking: Reported on 06/15/2021) 1 each 0   predniSONE (DELTASONE) 10 MG tablet 4 tabs po qd x 3d, then 3 tabs po qd x 3d, then 2 tabs po qd x 3d, then 1 tab po qd x 3d (Patient not taking: Reported on 06/15/2021) 30 tablet 0   No facility-administered medications prior to visit.    No Known Allergies  ROS As per HPI  PE: Vitals with BMI 06/15/2021 05/25/2021 05/02/2021  Height 5\' 6"  5\' 6"  5\' 6"   Weight 263 lbs 6 oz 263 lbs 6 oz 258 lbs  BMI 42.53 35.70 17.79  Systolic 390 300 923  Diastolic 76 63 78  Pulse 90 79 83     Physical Exam  VS: noted--normal. Gen: alert, NAD, NONTOXIC APPEARING. HEENT: eyes without injection, drainage, or swelling.  Ears: EACs clear, TMs with normal light reflex and landmarks.  Nose: Clear rhinorrhea, with some dried, crusty exudate adherent to mildly injected mucosa.  No purulent d/c.  No paranasal sinus TTP.  No facial swelling.  Throat and mouth without focal lesion.  No pharyngial swelling, erythema, or exudate.   Neck: supple, no LAD.   LUNGS: CTA bilat, nonlabored resps.   CV: RRR, no m/r/g. EXT: no c/c/e SKIN: no rash   LABS:  Last CBC Lab Results  Component Value Date   WBC 5.0 05/02/2021   HGB 17.6 (H) 05/02/2021   HCT 52.8 (H) 05/02/2021   MCV 89.6 05/02/2021   MCH 30.8 02/14/2018   RDW 14.6 05/02/2021   PLT 154.0  30/11/6224   Last metabolic panel Lab Results  Component Value Date   GLUCOSE 91 05/02/2021   NA 140 05/02/2021   K 4.2 05/02/2021   CL 103 05/02/2021   CO2 26 05/02/2021   BUN 19 05/02/2021   CREATININE 0.94 05/02/2021   GFRNONAA >60 06/05/2017   CALCIUM 8.9 05/02/2021   PHOS 2.6 06/19/2012   PROT 6.8 05/02/2021   ALBUMIN 4.0 05/02/2021   BILITOT 0.8 05/02/2021   ALKPHOS 80 05/02/2021  AST 17 05/02/2021   ALT 16 05/02/2021   ANIONGAP 6 06/05/2017   IMPRESSION AND PLAN:  Acute sinusitis.  Flu and COVID testing here today negative. Augmentin 875 twice daily x10 days. Nasal saline spray at least twice daily.  An After Visit Summary was printed and given to the patient.  FOLLOW UP: Return if symptoms worsen or fail to improve.  Signed:  Crissie Sickles, MD           06/15/2021

## 2021-07-21 ENCOUNTER — Telehealth: Payer: Self-pay | Admitting: Family Medicine

## 2021-07-21 MED ORDER — AMOXICILLIN-POT CLAVULANATE 875-125 MG PO TABS
1.0000 | ORAL_TABLET | Freq: Two times a day (BID) | ORAL | 0 refills | Status: DC
Start: 2021-07-21 — End: 2022-01-31

## 2021-07-21 NOTE — Telephone Encounter (Signed)
FYI: ? ?Pt called said that is he able to get a prescription for med below. He's ran out of the medication. ? ? ?amoxicillin-clavulanate ?amoxicillin-clavulanate (AUGMENTIN) 875-125 MG tablet ? ?Pt said it helped when he had his sinus infection not to long ago. However he feels like the infection is back. He's had it for 2 weeks now he said. Is he able to get the medication? ? ? ? ? ?

## 2021-07-21 NOTE — Telephone Encounter (Signed)
Okay, 10 days of Augmentin was sent to [facility]. ?If not significantly improved in 7 to 10 days needs to follow-up--virtual okay if he prefers. ?

## 2021-07-21 NOTE — Telephone Encounter (Signed)
Pt was last seen 06/15/21 and given 10 day course of med and advise to use nasal spray as well. ? ? ?Please review and advise ? ?

## 2021-07-21 NOTE — Telephone Encounter (Signed)
Pt advised rx sent in and will need to schedule OV if no improvement after this. ?

## 2021-07-25 ENCOUNTER — Telehealth: Payer: Self-pay

## 2021-07-25 NOTE — Telephone Encounter (Signed)
Pt declined stating he does not do AWV anymore  ?

## 2021-07-25 NOTE — Telephone Encounter (Signed)
Called pt to schedule AWV. Please schedule with health coach, Toria or Alonnah Lampkins. ? ?

## 2021-07-25 NOTE — Telephone Encounter (Signed)
Returning Derrick [name]'s call --- please call tomorrow. ?Called at 5pm 3/6 ?

## 2021-07-26 DIAGNOSIS — J342 Deviated nasal septum: Secondary | ICD-10-CM | POA: Diagnosis not present

## 2021-07-26 DIAGNOSIS — J309 Allergic rhinitis, unspecified: Secondary | ICD-10-CM | POA: Diagnosis not present

## 2021-09-02 DIAGNOSIS — J309 Allergic rhinitis, unspecified: Secondary | ICD-10-CM | POA: Diagnosis not present

## 2021-09-02 DIAGNOSIS — J342 Deviated nasal septum: Secondary | ICD-10-CM | POA: Diagnosis not present

## 2021-09-02 DIAGNOSIS — J343 Hypertrophy of nasal turbinates: Secondary | ICD-10-CM | POA: Diagnosis not present

## 2021-09-05 ENCOUNTER — Other Ambulatory Visit: Payer: Self-pay | Admitting: Family Medicine

## 2021-09-19 DIAGNOSIS — J343 Hypertrophy of nasal turbinates: Secondary | ICD-10-CM | POA: Diagnosis not present

## 2021-09-19 DIAGNOSIS — M810 Age-related osteoporosis without current pathological fracture: Secondary | ICD-10-CM | POA: Diagnosis not present

## 2021-09-19 DIAGNOSIS — J342 Deviated nasal septum: Secondary | ICD-10-CM | POA: Diagnosis not present

## 2021-09-19 DIAGNOSIS — J324 Chronic pansinusitis: Secondary | ICD-10-CM | POA: Diagnosis not present

## 2021-09-19 DIAGNOSIS — J3489 Other specified disorders of nose and nasal sinuses: Secondary | ICD-10-CM | POA: Diagnosis not present

## 2021-09-19 DIAGNOSIS — R0981 Nasal congestion: Secondary | ICD-10-CM | POA: Diagnosis not present

## 2021-09-19 DIAGNOSIS — J329 Chronic sinusitis, unspecified: Secondary | ICD-10-CM | POA: Diagnosis not present

## 2021-09-19 DIAGNOSIS — R519 Headache, unspecified: Secondary | ICD-10-CM | POA: Diagnosis not present

## 2021-10-11 DIAGNOSIS — F17220 Nicotine dependence, chewing tobacco, uncomplicated: Secondary | ICD-10-CM | POA: Diagnosis not present

## 2021-10-11 DIAGNOSIS — J343 Hypertrophy of nasal turbinates: Secondary | ICD-10-CM | POA: Diagnosis not present

## 2021-10-11 DIAGNOSIS — J3489 Other specified disorders of nose and nasal sinuses: Secondary | ICD-10-CM | POA: Diagnosis not present

## 2021-10-11 DIAGNOSIS — J342 Deviated nasal septum: Secondary | ICD-10-CM | POA: Diagnosis not present

## 2021-10-28 ENCOUNTER — Other Ambulatory Visit: Payer: Self-pay | Admitting: Family Medicine

## 2021-11-03 ENCOUNTER — Other Ambulatory Visit: Payer: Self-pay | Admitting: Family Medicine

## 2021-11-14 DIAGNOSIS — R0981 Nasal congestion: Secondary | ICD-10-CM | POA: Diagnosis not present

## 2021-11-14 DIAGNOSIS — J342 Deviated nasal septum: Secondary | ICD-10-CM | POA: Diagnosis not present

## 2021-11-14 DIAGNOSIS — J324 Chronic pansinusitis: Secondary | ICD-10-CM | POA: Diagnosis not present

## 2021-11-14 DIAGNOSIS — J343 Hypertrophy of nasal turbinates: Secondary | ICD-10-CM | POA: Diagnosis not present

## 2021-11-16 DIAGNOSIS — J324 Chronic pansinusitis: Secondary | ICD-10-CM | POA: Diagnosis not present

## 2021-11-16 DIAGNOSIS — J320 Chronic maxillary sinusitis: Secondary | ICD-10-CM | POA: Diagnosis not present

## 2021-11-21 DIAGNOSIS — H93293 Other abnormal auditory perceptions, bilateral: Secondary | ICD-10-CM | POA: Diagnosis not present

## 2021-11-21 DIAGNOSIS — J342 Deviated nasal septum: Secondary | ICD-10-CM | POA: Diagnosis not present

## 2021-11-21 DIAGNOSIS — J324 Chronic pansinusitis: Secondary | ICD-10-CM | POA: Diagnosis not present

## 2021-11-21 DIAGNOSIS — J343 Hypertrophy of nasal turbinates: Secondary | ICD-10-CM | POA: Diagnosis not present

## 2021-11-25 ENCOUNTER — Other Ambulatory Visit: Payer: Self-pay | Admitting: Family Medicine

## 2021-12-24 ENCOUNTER — Telehealth: Payer: Self-pay

## 2021-12-24 NOTE — Telephone Encounter (Signed)
Left message for patient to call office to schedule Medicare Well Visit.

## 2022-01-11 ENCOUNTER — Other Ambulatory Visit: Payer: Self-pay | Admitting: Family Medicine

## 2022-01-31 ENCOUNTER — Telehealth: Payer: Self-pay

## 2022-01-31 ENCOUNTER — Encounter: Payer: Self-pay | Admitting: Family Medicine

## 2022-01-31 ENCOUNTER — Ambulatory Visit (INDEPENDENT_AMBULATORY_CARE_PROVIDER_SITE_OTHER): Payer: Medicare Other | Admitting: Family Medicine

## 2022-01-31 VITALS — BP 115/70 | HR 75 | Temp 98.0°F | Ht 66.0 in | Wt 264.6 lb

## 2022-01-31 DIAGNOSIS — I10 Essential (primary) hypertension: Secondary | ICD-10-CM | POA: Diagnosis not present

## 2022-01-31 DIAGNOSIS — Z23 Encounter for immunization: Secondary | ICD-10-CM

## 2022-01-31 DIAGNOSIS — D751 Secondary polycythemia: Secondary | ICD-10-CM

## 2022-01-31 DIAGNOSIS — E7800 Pure hypercholesterolemia, unspecified: Secondary | ICD-10-CM | POA: Diagnosis not present

## 2022-01-31 LAB — COMPREHENSIVE METABOLIC PANEL
ALT: 20 U/L (ref 0–53)
AST: 16 U/L (ref 0–37)
Albumin: 3.9 g/dL (ref 3.5–5.2)
Alkaline Phosphatase: 77 U/L (ref 39–117)
BUN: 21 mg/dL (ref 6–23)
CO2: 27 mEq/L (ref 19–32)
Calcium: 9.1 mg/dL (ref 8.4–10.5)
Chloride: 104 mEq/L (ref 96–112)
Creatinine, Ser: 0.98 mg/dL (ref 0.40–1.50)
GFR: 76.61 mL/min (ref >60.00)
Glucose, Bld: 75 mg/dL (ref 70–99)
Potassium: 4.7 mEq/L (ref 3.5–5.1)
Sodium: 139 mEq/L (ref 135–145)
Total Bilirubin: 0.6 mg/dL (ref 0.2–1.2)
Total Protein: 6.9 g/dL (ref 6.0–8.3)

## 2022-01-31 LAB — CBC WITH DIFFERENTIAL/PLATELET
Basophils Absolute: 0 10*3/uL (ref 0.0–0.1)
Basophils Relative: 0.4 % (ref 0.0–3.0)
Eosinophils Absolute: 0.2 10*3/uL (ref 0.0–0.7)
Eosinophils Relative: 2.1 % (ref 0.0–5.0)
HCT: 50.1 % (ref 39.0–52.0)
Hemoglobin: 16.7 g/dL (ref 13.0–17.0)
Lymphocytes Relative: 34.3 % (ref 12.0–46.0)
Lymphs Abs: 2.7 10*3/uL (ref 0.7–4.0)
MCHC: 33.3 g/dL (ref 30.0–36.0)
MCV: 89.5 fl (ref 78.0–100.0)
Monocytes Absolute: 1.2 10*3/uL — ABNORMAL HIGH (ref 0.1–1.0)
Monocytes Relative: 15.4 % — ABNORMAL HIGH (ref 3.0–12.0)
Neutro Abs: 3.8 10*3/uL (ref 1.4–7.7)
Neutrophils Relative %: 47.8 % (ref 43.0–77.0)
Platelets: 208 10*3/uL (ref 150.0–400.0)
RBC: 5.6 Mil/uL (ref 4.22–5.81)
RDW: 15.8 % — ABNORMAL HIGH (ref 11.5–15.5)
WBC: 7.9 10*3/uL (ref 4.0–10.5)

## 2022-01-31 LAB — LIPID PANEL
Cholesterol: 135 mg/dL (ref 0–200)
HDL: 40.8 mg/dL (ref >39.00)
LDL Cholesterol: 68 mg/dL (ref 0–99)
NonHDL: 94.09
Total CHOL/HDL Ratio: 3
Triglycerides: 131 mg/dL (ref 0.0–149.0)
VLDL: 26.2 mg/dL (ref 0.0–40.0)

## 2022-01-31 MED ORDER — ZOSTER VAC RECOMB ADJUVANTED 50 MCG/0.5ML IM SUSR: 0.5000 mL | Freq: Once | INTRAMUSCULAR | 0 refills | Status: AC

## 2022-01-31 MED ORDER — FINASTERIDE 5 MG PO TABS
ORAL_TABLET | ORAL | 1 refills | Status: DC
Start: 2022-01-31 — End: 2022-07-27

## 2022-01-31 MED ORDER — PANTOPRAZOLE SODIUM 40 MG PO TBEC: 40.0000 mg | DELAYED_RELEASE_TABLET | Freq: Every day | ORAL | 1 refills | Status: DC

## 2022-01-31 NOTE — Progress Notes (Signed)
Office Note 01/31/2022  CC:  Chief Complaint  Patient presents with   Hypertension   Hyperlipidemia    Pt is fasting   HPI:  Patient is a 71 y.o. male who is here for follow-up hypertension, hyperlipidemia, and erythrocytosis. A/P as of last visit: "1) HTN: well controlled on losartan 100 qd and toprol xl 50 qd. Lytes/cr today.   2) HLD: cont atorva 20 qd. FLP and hepatic panel today.   3) Erythrocytosis: table dating back to 2009 (17-18 range). Iron normal. No s/s of OSA.  Epo normal.  Just following as long as stable as of 10/2020 CBC today.   4) URI with cough/congestion: resolving appropriately.   5) Musculoskeletal mid back pain---greatly improved with PT, which he finishes up with this week.   6) Preventative health: Vaccines: ALL UTD. Prostate ca screening is done by his urologist. Coloncopy 5 yr recall due at any time now."  INTERIM HX: Kaylon feels well. He is considering getting sinus surgery, has ENT follow-up soon.  ROS --> no fevers, no CP, no SOB, no wheezing, no cough, no dizziness, no HAs, no rashes, no melena/hematochezia.  No polyuria or polydipsia.  No myalgias or arthralgias.  No focal weakness, paresthesias, or tremors.  No acute vision or hearing abnormalities.  No dysuria or unusual/new urinary urgency or frequency.  No recent changes in lower legs. No n/v/d or abd pain.  No palpitations.     Past Medical History:  Diagnosis Date   BPH with obstruction/lower urinary tract symptoms    +Hx of acute urinary retention/acute cystitis: responding fairly well to tamsulosin and finasteride as of 09/2016 urol f/u.  Urolift and bladd bx 02/2018-->doing great as of 07/2019 urol f/u.   Deafness in right ear    Erythrocytosis    stable dating back to 2009 (17-18 range). Iron normal. No s/s of OSA.  Epo normal.  Just following as long as stable as of 10/2020 (no hem/onc ref)   Gross hematuria    hematuria protocol w/u being done by urol as of their 01/23/18 note.    History of back injury    Back fracture fell from tree   History of erectile dysfunction    History of fracture of right ankle 05/2015   History of kidney stones    History of staph infection 10 yrs ago   after back surgery   HTN (hypertension)    Hyperlipidemia    Low back pain    Microhematuria    chronic; pt gets periodic cystoscopy for monitoring, most recent 07/30/20-->bx showed benign urothelium and submucosa, with eosinophilic cystitis.   Nephrolithiasis 2015   CT showed 2cm left sided stone which was stable since 2006 imaging and likely in renal parenchyma.  2.5 cm as of 08/2017 neph f/u: renal u/s 12/2017-->no hydro. KUB showed stable L sided 18 mm stone. Cystoscopy 12/2017 erythematous mucosa at dome, no mass.    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  Annotation: previous methicillin sensitive  staphylococcus aureus 2006 Qualifier: Diagnosis of  By: Johnnye Sima MD, Dellis Filbert  (ID Specialist)     Prostate cancer screening    via Urol--> PSA 1.39 April 2019. 1.36 Feb 2020.   Tinnitus, left ear     Past Surgical History:  Procedure Laterality Date   COLONOSCOPY  10/12/2015   repeat 5 years   COLONOSCOPY W/ POLYPECTOMY  2007   Negative 2012 and 2017; Dr  Patil--recall 2022.   CYSTOSCOPY     with bladder biopsy:  dome of bladder with an area of chronic inflammation.  No malignancy.   CYSTOSCOPY WITH BIOPSY N/A 02/19/2018   Procedure: CYSTOSCOPY WITH BIOPSY/ FULGURATION / UROLIFT;  Surgeon: Festus Aloe, MD;  Location: [facility];  Service: Urology;  Laterality: N/A;   CYSTOSCOPY WITH FULGERATION N/A 07/30/2020   Bx benign. Procedure: CYSTOSCOPY WITH BLADDER BIOPSY/FULGERATION;  Surgeon: Festus Aloe, MD;  Location: [facility];  Service: Urology;  Laterality: N/A;   CYSTOSCOPY WITH INSERTION OF UROLIFT  02/19/2018   LUMBAR LAMINECTOMY     Dr Vertell Limber performed 2nd & 3rd procedures  (pt also has hx of fall and vertebral fracture years prior to his surgeries.   TONSILLECTOMY  as child    Family History  Problem Relation Age of Onset   Stroke Mother    Cancer Mother 39       melanoma   Cancer Father 51       lung cancer; [employer] 2 (nonsmoker)   Heart disease Maternal Grandmother    Heart disease Maternal Grandfather    Heart disease Paternal Grandmother    Cancer Brother        bladder cancer    Social History   Socioeconomic History   Marital status: Married    Spouse name: Not on file   Number of children: Not on file   Years of education: Not on file   Highest education level: Not on file  Occupational History   Not on file  Tobacco Use   Smoking status: Former    Packs/day: 2.00    Years: 20.00    Total pack years: 40.00    Types: Cigarettes    Quit date: 05/22/1973    Years since quitting: 48.7   Smokeless tobacco: Current    Types: Chew   Tobacco comments:    35 years ago as of 2013  Vaping Use   Vaping Use: Never used  Substance and Sexual Activity   Alcohol use: Not Currently   Drug use: No   Sexual activity: Not on file  Other Topics Concern   Not on file  Social History Narrative   Married, 1 son and 2 grandchildren.   Orig from [place] area, [employer].   Retired Electrical engineer (age 7).   [place]  Belongs to [employer].         Social Determinants of Health   Financial Resource Strain: Not on file  Food Insecurity: Not on file  Transportation Needs: Not on file  Physical Activity: Not on file  Stress: Not on file  Social Connections: Not on file  Intimate Partner Violence: Not on file    Outpatient Medications Prior to Visit  Medication Sig Dispense Refill   albuterol (VENTOLIN HFA) 108 (90 Base) MCG/ACT inhaler TAKE 2 PUFFS BY MOUTH EVERY 6 HOURS AS NEEDED FOR WHEEZE OR SHORTNESS OF BREATH 6.7 each 0   Ascorbic Acid (VITAMIN C) 1000 MG tablet Take 1,000 mg by mouth at bedtime.      atorvastatin (LIPITOR) 20 MG tablet Take 1 tablet (20 mg total) by mouth daily. 90 tablet 3   diclofenac (VOLTAREN) 75 MG EC tablet TAKE 1 TABLET BY MOUTH TWICE A DAY AS NEEDED FOR ARTHRITIS PAIN. 60 tablet 2   losartan (COZAAR) 100 MG tablet Take 1 tablet (100 mg total) by mouth daily. 90 tablet 3   metoprolol succinate (TOPROL-XL) 50 MG 24 hr tablet Take  1 tablet (50 mg total) by mouth daily. TAKE WITH OR IMMEDIATELY FOLLOWING A MEAL. 90 tablet 3   tamsulosin (FLOMAX) 0.4 MG CAPS capsule TAKE 1 CAPSULE BY MOUTH EVERY DAY 90 capsule 1   amoxicillin-clavulanate (AUGMENTIN) 875-125 MG tablet Take 1 tablet by mouth 2 (two) times daily. 20 tablet 0   finasteride (PROSCAR) 5 MG tablet TAKE 1 TABLET BY MOUTH EVERYDAY AT BEDTIME 30 tablet 0   pantoprazole (PROTONIX) 40 MG tablet TAKE 1 TABLET BY MOUTH EVERY DAY 30 tablet 0   No facility-administered medications prior to visit.    No Known Allergies  PE;    01/31/2022    9:54 AM 06/15/2021    4:04 PM 05/25/2021    2:45 PM  Vitals with BMI  Height '5\' 6"'$  '5\' 6"'$  '5\' 6"'$   Weight 264 lbs 10 oz 263 lbs 6 oz 263 lbs 6 oz  BMI 42.73 44.03 47.42  Systolic 595 638 756  Diastolic 70 76 63  Pulse 75 90 79   Gen: Alert, well appearing.  Patient is oriented to person, place, time, and situation. AFFECT: pleasant, lucid thought and speech.   Eyes: no injection, icteris, swelling, or exudate.  EOMI, PERRLA. Nose: no drainage or turbinate edema/swelling.  No injection or focal lesion.  Mouth: lips without lesion/swelling.  Oral mucosa pink and moist.  Dentition intact and without obvious caries or gingival swelling.  Oropharynx without erythema, exudate, or swelling.  Neck: supple/nontender.  No LAD, mass, or TM.  Carotid pulses 2+ bilaterally, without bruits. CV: RRR, no m/r/g.   LUNGS: CTA bilat, nonlabored resps, good aeration in all lung fields. ABD: soft, NT, ND, BS normal.  No hepatospenomegaly or mass.  No bruits. EXT: He has 3+ bilateral lower extremity  pitting edema Musculoskeletal: no joint swelling, erythema, warmth, or tenderness.  ROM of all joints intact. Skin - no sores or suspicious lesions or rashes or color changes  Pertinent labs:  Lab Results  Component Value Date   TSH 1.40 07/05/2015   Lab Results  Component Value Date   WBC 5.0 05/02/2021   HGB 17.6 (H) 05/02/2021   HCT 52.8 (H) 05/02/2021   MCV 89.6 05/02/2021   PLT 154.0 05/02/2021   Lab Results  Component Value Date   IRON 111 11/10/2020   TIBC 280 11/10/2020   FERRITIN 180 11/10/2020   Lab Results  Component Value Date   CREATININE 0.94 05/02/2021   BUN 19 05/02/2021   NA 140 05/02/2021   K 4.2 05/02/2021   CL 103 05/02/2021   CO2 26 05/02/2021   Lab Results  Component Value Date   ALT 16 05/02/2021   AST 17 05/02/2021   ALKPHOS 80 05/02/2021   BILITOT 0.8 05/02/2021   Lab Results  Component Value Date   CHOL 133 05/02/2021   Lab Results  Component Value Date   HDL 32.60 (L) 05/02/2021   Lab Results  Component Value Date   LDLCALC 80 05/02/2021   Lab Results  Component Value Date   TRIG 99.0 05/02/2021   Lab Results  Component Value Date   CHOLHDL 4 05/02/2021   Lab Results  Component Value Date   PSA 1.36 07/04/2018   PSA 1.36 07/04/2018   PSA 1.95 06/21/2011   ASSESSMENT AND PLAN:   #1 hypertension, well controlled on losartan 100 mg a day and Toprol-XL 50 mg a day. Electrolytes and creatinine today.  2.  Hyperlipidemia, doing well on Lipitor 20 mg a day. Lipid and hepatic  panels today.  3. Preventative health: PSAs followed by urology. Due for repeat colonoscopy this year. Vaccines: shingrix-> prescription to pharmacy.  Flu-> given today.  Otherwise all UTD.  4. Erythrocytosis: table dating back to 2009 (17-18 range). Iron normal. No s/s of OSA.  Epo normal.  Just following as long as stable as of 04/2021. CBC today.  An After Visit Summary was printed and given to the patient.  FOLLOW UP:  Return in about 6  months (around 08/01/2022) for routine chronic illness f/u.  Signed:  Crissie Sickles, MD           01/31/2022

## 2022-01-31 NOTE — Telephone Encounter (Signed)
Spoke with pt to schedule AWV in office. Patient declined to schedule wellness visit at this time.   

## 2022-02-21 DIAGNOSIS — R0981 Nasal congestion: Secondary | ICD-10-CM | POA: Diagnosis not present

## 2022-02-21 DIAGNOSIS — J324 Chronic pansinusitis: Secondary | ICD-10-CM | POA: Diagnosis not present

## 2022-02-21 DIAGNOSIS — J342 Deviated nasal septum: Secondary | ICD-10-CM | POA: Diagnosis not present

## 2022-02-21 DIAGNOSIS — J3489 Other specified disorders of nose and nasal sinuses: Secondary | ICD-10-CM | POA: Diagnosis not present

## 2022-02-21 DIAGNOSIS — J343 Hypertrophy of nasal turbinates: Secondary | ICD-10-CM | POA: Diagnosis not present

## 2022-03-27 ENCOUNTER — Other Ambulatory Visit: Payer: Self-pay | Admitting: Otolaryngology

## 2022-04-21 ENCOUNTER — Observation Stay (HOSPITAL_COMMUNITY)
Admission: EM | Admit: 2022-04-21 | Discharge: 2022-04-22 | Disposition: A | Discharge status: Home / Self Care | Payer: Medicare Other | Attending: Internal Medicine | Admitting: Internal Medicine

## 2022-04-21 ENCOUNTER — Other Ambulatory Visit: Payer: Self-pay

## 2022-04-21 ENCOUNTER — Emergency Department (HOSPITAL_COMMUNITY): Payer: Medicare Other

## 2022-04-21 ENCOUNTER — Encounter (HOSPITAL_COMMUNITY): Payer: Self-pay

## 2022-04-21 DIAGNOSIS — Z87891 Personal history of nicotine dependence: Secondary | ICD-10-CM | POA: Insufficient documentation

## 2022-04-21 DIAGNOSIS — E785 Hyperlipidemia, unspecified: Secondary | ICD-10-CM | POA: Diagnosis present

## 2022-04-21 DIAGNOSIS — G459 Transient cerebral ischemic attack, unspecified: Secondary | ICD-10-CM | POA: Diagnosis not present

## 2022-04-21 DIAGNOSIS — R410 Disorientation, unspecified: Secondary | ICD-10-CM | POA: Insufficient documentation

## 2022-04-21 DIAGNOSIS — I639 Cerebral infarction, unspecified: Secondary | ICD-10-CM | POA: Diagnosis not present

## 2022-04-21 DIAGNOSIS — R531 Weakness: Secondary | ICD-10-CM | POA: Insufficient documentation

## 2022-04-21 DIAGNOSIS — R200 Anesthesia of skin: Secondary | ICD-10-CM | POA: Diagnosis not present

## 2022-04-21 DIAGNOSIS — I10 Essential (primary) hypertension: Secondary | ICD-10-CM | POA: Insufficient documentation

## 2022-04-21 DIAGNOSIS — R202 Paresthesia of skin: Secondary | ICD-10-CM | POA: Diagnosis not present

## 2022-04-21 DIAGNOSIS — Z79899 Other long term (current) drug therapy: Secondary | ICD-10-CM | POA: Insufficient documentation

## 2022-04-21 DIAGNOSIS — E782 Mixed hyperlipidemia: Secondary | ICD-10-CM | POA: Diagnosis not present

## 2022-04-21 DIAGNOSIS — R4701 Aphasia: Secondary | ICD-10-CM | POA: Insufficient documentation

## 2022-04-21 DIAGNOSIS — R4781 Slurred speech: Secondary | ICD-10-CM | POA: Insufficient documentation

## 2022-04-21 DIAGNOSIS — R29818 Other symptoms and signs involving the nervous system: Secondary | ICD-10-CM | POA: Diagnosis not present

## 2022-04-21 DIAGNOSIS — I161 Hypertensive emergency: Secondary | ICD-10-CM | POA: Insufficient documentation

## 2022-04-21 LAB — CBC
HCT: 49.6 % (ref 39.0–52.0)
HCT: 44.3 % (ref 39.0–52.0)
Hemoglobin: 16.7 g/dL (ref 13.0–17.0)
Hemoglobin: 14.7 g/dL (ref 13.0–17.0)
MCH: 30.1 pg (ref 26.0–34.0)
MCH: 29.8 pg (ref 26.0–34.0)
MCHC: 33.7 g/dL (ref 30.0–36.0)
MCHC: 33.2 g/dL (ref 30.0–36.0)
MCV: 89.5 fL (ref 80.0–100.0)
MCV: 89.9 fL (ref 80.0–100.0)
Platelets: 193 10*3/uL (ref 150–400)
Platelets: 191 10*3/uL (ref 150–400)
RBC: 5.54 MIL/uL (ref 4.22–5.81)
RBC: 4.93 MIL/uL (ref 4.22–5.81)
RDW: 14.6 % (ref 11.5–15.5)
RDW: 14.8 % (ref 11.5–15.5)
WBC: 10.1 10*3/uL (ref 4.0–10.5)
WBC: 10.2 10*3/uL (ref 4.0–10.5)
nRBC: 0 % (ref 0.0–0.2)
nRBC: 0 % (ref 0.0–0.2)

## 2022-04-21 LAB — APTT: aPTT: 26 seconds (ref 24–36)

## 2022-04-21 LAB — DIFFERENTIAL
Abs Immature Granulocytes: 0.05 10*3/uL (ref 0.00–0.07)
Basophils Absolute: 0.1 10*3/uL (ref 0.0–0.1)
Basophils Relative: 1 %
Eosinophils Absolute: 0.1 10*3/uL (ref 0.0–0.5)
Eosinophils Relative: 1 %
Immature Granulocytes: 1 %
Lymphocytes Relative: 29 %
Lymphs Abs: 2.9 10*3/uL (ref 0.7–4.0)
Monocytes Absolute: 1.2 10*3/uL — ABNORMAL HIGH (ref 0.1–1.0)
Monocytes Relative: 12 %
Neutro Abs: 5.7 10*3/uL (ref 1.7–7.7)
Neutrophils Relative %: 56 %

## 2022-04-21 LAB — I-STAT CHEM 8, ED
BUN: 14 mg/dL (ref 8–23)
Calcium, Ion: 1.11 mmol/L — ABNORMAL LOW (ref 1.15–1.40)
Chloride: 105 mmol/L (ref 98–111)
Creatinine, Ser: 0.8 mg/dL (ref 0.61–1.24)
Glucose, Bld: 107 mg/dL — ABNORMAL HIGH (ref 70–99)
HCT: 48 % (ref 39.0–52.0)
Hemoglobin: 16.3 g/dL (ref 13.0–17.0)
Potassium: 3.8 mmol/L (ref 3.5–5.1)
Sodium: 141 mmol/L (ref 135–145)
TCO2: 24 mmol/L (ref 22–32)

## 2022-04-21 LAB — RAPID URINE DRUG SCREEN, HOSP PERFORMED
Amphetamines: NOT DETECTED
Barbiturates: NOT DETECTED
Benzodiazepines: NOT DETECTED
Cocaine: NOT DETECTED
Opiates: NOT DETECTED
Tetrahydrocannabinol: NOT DETECTED

## 2022-04-21 LAB — COMPREHENSIVE METABOLIC PANEL
ALT: 21 U/L (ref 0–44)
AST: 21 U/L (ref 15–41)
Albumin: 3.7 g/dL (ref 3.5–5.0)
Alkaline Phosphatase: 71 U/L (ref 38–126)
Anion gap: 5 (ref 5–15)
BUN: 15 mg/dL (ref 8–23)
CO2: 25 mmol/L (ref 22–32)
Calcium: 8 mg/dL — ABNORMAL LOW (ref 8.9–10.3)
Chloride: 102 mmol/L (ref 98–111)
Creatinine, Ser: 0.92 mg/dL (ref 0.61–1.24)
GFR, Estimated: >60 mL/min (ref >60)
Glucose, Bld: 117 mg/dL — ABNORMAL HIGH (ref 70–99)
Potassium: 3.5 mmol/L (ref 3.5–5.1)
Sodium: 132 mmol/L — ABNORMAL LOW (ref 135–145)
Total Bilirubin: 0.8 mg/dL (ref 0.3–1.2)
Total Protein: 7.2 g/dL (ref 6.5–8.1)

## 2022-04-21 LAB — ETHANOL: Alcohol, Ethyl (B): <10 mg/dL (ref <10)

## 2022-04-21 LAB — PROTIME-INR
INR: 1 (ref 0.8–1.2)
Prothrombin Time: 13.4 seconds (ref 11.4–15.2)

## 2022-04-21 LAB — CREATININE, SERUM
Creatinine, Ser: 1.16 mg/dL (ref 0.61–1.24)
GFR, Estimated: >60 mL/min (ref >60)

## 2022-04-21 LAB — CBG MONITORING, ED: Glucose-Capillary: 119 mg/dL — ABNORMAL HIGH (ref 70–99)

## 2022-04-21 MED ORDER — ACETAMINOPHEN 325 MG PO TABS: 650.0000 mg | ORAL_TABLET | ORAL | Status: DC | PRN

## 2022-04-21 MED ORDER — SENNOSIDES-DOCUSATE SODIUM 8.6-50 MG PO TABS: 1.0000 | ORAL_TABLET | Freq: Every evening | ORAL | Status: DC | PRN

## 2022-04-21 MED ORDER — ACETAMINOPHEN 650 MG RE SUPP: 650.0000 mg | RECTAL | Status: DC | PRN

## 2022-04-21 MED ORDER — ACETAMINOPHEN 160 MG/5ML PO SOLN: 650.0000 mg | ORAL | Status: DC | PRN

## 2022-04-21 MED ORDER — FINASTERIDE 5 MG PO TABS
5.0000 mg | ORAL_TABLET | Freq: Every day | ORAL | Status: DC
  Administered 2022-04-21: 5 mg via ORAL
  Filled 2022-04-21: qty 1

## 2022-04-21 MED ORDER — STROKE: EARLY STAGES OF RECOVERY BOOK
Freq: Once | Status: AC
  Filled 2022-04-21: qty 1

## 2022-04-21 MED ORDER — PANTOPRAZOLE SODIUM 40 MG PO TBEC
40.0000 mg | DELAYED_RELEASE_TABLET | Freq: Every day | ORAL | Status: DC
  Administered 2022-04-22: 40 mg via ORAL
  Filled 2022-04-21: qty 1

## 2022-04-21 MED ORDER — TAMSULOSIN HCL 0.4 MG PO CAPS
0.4000 mg | ORAL_CAPSULE | Freq: Every day | ORAL | Status: DC
  Administered 2022-04-22: 0.4 mg via ORAL
  Filled 2022-04-21: qty 1

## 2022-04-21 MED ORDER — SODIUM CHLORIDE 0.9% FLUSH
3.0000 mL | Freq: Once | INTRAVENOUS | Status: AC
  Administered 2022-04-21: 3 mL via INTRAVENOUS

## 2022-04-21 MED ORDER — ATORVASTATIN CALCIUM 10 MG PO TABS: 20.0000 mg | ORAL_TABLET | Freq: Every day | ORAL | Status: DC

## 2022-04-21 MED ORDER — ENOXAPARIN SODIUM 40 MG/0.4ML IJ SOSY
40.0000 mg | PREFILLED_SYRINGE | Freq: Every day | INTRAMUSCULAR | Status: DC
  Administered 2022-04-21: 40 mg via SUBCUTANEOUS
  Filled 2022-04-21: qty 0.4

## 2022-04-21 MED ORDER — IOHEXOL 350 MG/ML SOLN
100.0000 mL | Freq: Once | INTRAVENOUS | Status: AC | PRN
  Administered 2022-04-21: 100 mL via INTRAVENOUS

## 2022-04-21 MED ORDER — SODIUM CHLORIDE 0.9 % IV SOLN: INTRAVENOUS | Status: DC

## 2022-04-21 NOTE — Consult Note (Signed)
[facility] Telemedicine Consult   Requesting Provider: Dr. Rogene Houston Consult Participants: Dr. Jerelyn Charles, Telespecialist RN Arnette Schaumann   bedside RN [facility] Location of the provider: Home Location of the patient: [name]  This consult was provided via telemedicine with 2-way video and audio communication. The patient/family was informed that care would be provided in this way and agreed to receive care in this manner.   CC: Slurred speech, left-sided weakness   History is obtained from: Patient, chart, patient's wife   HPI: Derrick [name] is a 71 y.o. male past medical history as documented below, presented to the emergency room for evaluation of sudden onset of left-sided numbness and tingling along with slurred speech.  He reports that he went to bed last night at 9 PM with no symptoms and woke up this morning at 5 AM and was sitting in his then when he started noticing some tingling and numbness that happened on his left toes and then involved his left arm.  He also felt some numbness on his left face.  This lasted about 20 minutes.  He did not make much of it and went about his day.  He visited his mother-in-law in the hospital this evening and on the way back had again a sudden onset of left-sided numbness along with inability to hold objects from the left hand-she describes trying to hold a dog bowl and putting the clip of the dogs leash on the harness but could not do that.  His wife also noticed that his speech was very slurred.  She drove him to the hospital.  His speech improved on the way.  He still had some mild dysarthria and some mild numbness.  He was taken for stat CT and a code stroke was then activated.  When I saw him most of his symptoms had resolved with the exception of very mild slurred speech and I scored him a NIH of 1.   Past Medical History:  Diagnosis Date   BPH with obstruction/lower urinary tract symptoms    +Hx of acute urinary retention/acute  cystitis: responding fairly well to tamsulosin and finasteride as of 09/2016 urol f/u.  Urolift and bladd bx 02/2018-->doing great as of 07/2019 urol f/u.   Deafness in right ear    Erythrocytosis    stable dating back to 2009 (17-18 range). Iron normal. No s/s of OSA.  Epo normal.  Just following as long as stable as of 10/2020 (no hem/onc ref)   Gross hematuria    hematuria protocol w/u being done by urol as of their 01/23/18 note.   History of back injury    Back fracture fell from tree   History of erectile dysfunction    History of fracture of right ankle 05/2015   History of kidney stones    History of staph infection 10 yrs ago   after back surgery   HTN (hypertension)    Hyperlipidemia    Low back pain    Microhematuria    chronic; pt gets periodic cystoscopy for monitoring, most recent 07/30/20-->bx showed benign urothelium and submucosa, with eosinophilic cystitis.   Nephrolithiasis 2015   CT showed 2cm left sided stone which was stable since 2006 imaging and likely in renal parenchyma.  2.5 cm as of 08/2017 neph f/u: renal u/s 12/2017-->no hydro. KUB showed stable L sided 18 mm stone. Cystoscopy 12/2017 erythematous mucosa at dome, no mass.    Obesity, Class II, BMI 35-39.9    OSTEOMYELITIS, CHRONIC 02/21/2006  Staph infection s/p lumbar surgery (surgery x 3).  Annotation: previous methicillin sensitive  staphylococcus aureus 2006 Qualifier: Diagnosis of  By: Johnnye Sima MD, Dellis Filbert  (ID Specialist)     Prostate cancer screening    via Urol--> PSA 1.39 April 2019. 1.36 Feb 2020.   Tinnitus, left ear      Current Facility-Administered Medications:    sodium chloride flush (NS) 0.9 % injection 3 mL, 3 mL, Intravenous, Once, [place], [place] A, PA-C  Current Outpatient Medications:    albuterol (VENTOLIN HFA) 108 (90 Base) MCG/ACT inhaler, TAKE 2 PUFFS BY MOUTH EVERY 6 HOURS AS NEEDED FOR WHEEZE OR SHORTNESS OF BREATH, Disp: 6.7 each, Rfl: 0   Ascorbic Acid (VITAMIN C) 1000 MG tablet,  Take 1,000 mg by mouth at bedtime., Disp: , Rfl:    atorvastatin (LIPITOR) 20 MG tablet, Take 1 tablet (20 mg total) by mouth daily., Disp: 90 tablet, Rfl: 3   diclofenac (VOLTAREN) 75 MG EC tablet, TAKE 1 TABLET BY MOUTH TWICE A DAY AS NEEDED FOR ARTHRITIS PAIN., Disp: 60 tablet, Rfl: 2   finasteride (PROSCAR) 5 MG tablet, TAKE 1 TABLET BY MOUTH EVERYDAY AT BEDTIME, Disp: 90 tablet, Rfl: 1   losartan (COZAAR) 100 MG tablet, Take 1 tablet (100 mg total) by mouth daily., Disp: 90 tablet, Rfl: 3   metoprolol succinate (TOPROL-XL) 50 MG 24 hr tablet, Take 1 tablet (50 mg total) by mouth daily. TAKE WITH OR IMMEDIATELY FOLLOWING A MEAL., Disp: 90 tablet, Rfl: 3   pantoprazole (PROTONIX) 40 MG tablet, Take 1 tablet (40 mg total) by mouth daily., Disp: 90 tablet, Rfl: 1   tamsulosin (FLOMAX) 0.4 MG CAPS capsule, TAKE 1 CAPSULE BY MOUTH EVERY DAY, Disp: 90 capsule, Rfl: 1    LKW: 6:30 PM-considering that is the last known well because the morning episode had completely resolved in 20 minutes and he was symptom-free throughout the day IV thrombolysis given?: no, too mild to treat.  NIH 1.  Discussed in detail risk benefits and alternatives with the wife and decided not to use IV thrombolysis EVT: Exam not suggestive of LVO Premorbid modified Rankin scale (mRS): 0 Time of teleneurologist evaluation: 1927 hrs  Exam: Vitals:   04/21/22 1915 04/21/22 1941  BP: (!) 147/85 (!) 167/89  Pulse: 77 81  Resp: 17 (!) 23  Temp:    SpO2: 95% 96%    General: Awake alert in no distress Neurological exam Awake alert oriented x 3.  Mild dysarthria.  No aphasia.,  Cranial nerves II through 12 intact.  Motor exam with no drift-except left leg had a bit of a cramp but not necessarily weak.  Sensation intact without extinction.  No dysmetria   NIHSS 1A: Level of Consciousness - 0 1B: Ask Month and Age - 0 1C: 'Blink Eyes' & 'Squeeze Hands' - 0 2: Test Horizontal Extraocular Movements - 0 3: Test Visual Fields  - 0 4: Test Facial Palsy - 0 5A: Test Left Arm Motor Drift - 0 5B: Test Right Arm Motor Drift - 0 6A: Test Left Leg Motor Drift - 0 6B: Test Right Leg Motor Drift - 0 7: Test Limb Ataxia - 0 8: Test Sensation - 0 9: Test Language/Aphasia- 0 10: Test Dysarthria - 1 11: Test Extinction/Inattention - 0 NIHSS score: 1   Imaging Reviewed: CT head without contrast-aspects 10.  No bleed  Labs reviewed in epic and pertinent values follow: CBC    Component Value Date/Time   WBC 10.1 04/21/2022 1926  RBC 5.54 04/21/2022 1926   HGB 16.7 04/21/2022 1926   HCT 49.6 04/21/2022 1926   PLT 193 04/21/2022 1926   MCV 89.5 04/21/2022 1926   MCH 30.1 04/21/2022 1926   MCHC 33.7 04/21/2022 1926   RDW 14.6 04/21/2022 1926   LYMPHSABS 2.9 04/21/2022 1926   MONOABS 1.2 (H) 04/21/2022 1926   EOSABS 0.1 04/21/2022 1926   BASOSABS 0.1 04/21/2022 1926   CMP     Component Value Date/Time   NA 139 01/31/2022 1034   K 4.7 01/31/2022 1034   CL 104 01/31/2022 1034   CO2 27 01/31/2022 1034   GLUCOSE 75 01/31/2022 1034   BUN 21 01/31/2022 1034   CREATININE 0.98 01/31/2022 1034   CREATININE 1.12 01/01/2013 1604   CALCIUM 9.1 01/31/2022 1034   PROT 6.9 01/31/2022 1034   ALBUMIN 3.9 01/31/2022 1034   AST 16 01/31/2022 1034   ALT 20 01/31/2022 1034   ALKPHOS 77 01/31/2022 1034   BILITOT 0.6 01/31/2022 1034   GFRNONAA >60 06/05/2017 1243   GFRAA >60 06/05/2017 1243     Assessment: 71 year old man with above past medical history presenting for a second episode of left-sided numbness that started around 6:30 PM and lasted for about 45 minutes as well as slurred speech that started around the same time and still has very mild dysarthria.  Had another episode of left-sided numbness earlier this morning that lasted 20 minutes. Clinically suspect crescendo TIAs versus small lacunar infarction with fluctuating symptoms. Discussed risk benefits and alternatives of IV TNKase and due to very mild symptoms,  decided against use of IV TNKase and discussion with the patient and wife. He does still does need admission for stroke/TIA risk factor workup. Given his fluctuating symptoms, I would recommend ED to ED transfer to [facility] where he can also get an MRI as well as emergent in person neurological evaluation should he worsen.  Impression Stroke/TIA workup   Recommendations:  Stat CT angiography head and neck with perfusion. After imaging is completed and if there is no indication for emergent intervention, I would still recommend an emergent ED to ED transfer for admission to [facility]. I recommend this because he would need closer neuromonitoring given fluctuating symptoms. His last known normal remains at 6:30 PM.  He remains in the window for IV TNKase should he worsen till 58 PM.  His last known well is not resetting right now because he still is an NIH of 1. Admit to hospitalist Frequent neurochecks Telemetry 2D echo MRI brain without contrast A1c Lipid panel PT OT next speech therapy Allow for permissive hypertension-treat blood pressure only if systolic blood pressures greater than 220 on a as needed basis for the next 48 to 72 hours.  Long-term blood pressure goal although is 140/90 or below. Stroke team will follow the patient in the morning.  Please call the inpatient neurohospitalist if there is any sudden neurochange.  Also consider repeat imaging at that time. I will inform my colleague over at [facility] about this patient and he will be available to see the patient should anything require emergent evaluation overnight.  This with the patient's wife at bedside, patient and Dr. Rogene Houston over the phone.  -- Amie Portland, MD Neurologist [facility] Pager: [phone]

## 2022-04-21 NOTE — ED Notes (Signed)
Called report to [place] at [facility]. Called [facility] the patient is next on the list.

## 2022-04-21 NOTE — ED Provider Triage Note (Signed)
Emergency Medicine Provider Triage Evaluation Note  Derrick [name] , a 71 y.o. male  was evaluated in triage.  Patient is here with his wife.  Patient tells me that around 5 this morning he had numbness to his left upper and left lower extremity.  Also had some left facial numbness.  This resolved within 30 minutes.  Around 5:30 PM today the patient was carrying a bowl of water for the dog and all of a sudden dropped the bowl out of his left hand.  Wife says that he had very slurred and garbled speech at that time.  He also was unable to put the leash on the dog and seemed very confused.  She says that this only lasted a couple of minutes and that now he is almost back to baseline.  She says that his speech still seems very garbled to her.  He also says he is feeling as though his speech is slurred.     Positive:  Negative:   Physical Exam  Ht '5\' 7"'$  (1.702 m)   Wt 111.1 kg   BMI 38.37 kg/m  Gen:   Awake, no distress   Resp:  Normal effort  MSK:   Moves extremities without difficulty  Other:  Cranial nerves II through XII grossly intact.  Does not appear to have a strength deficit in any of his extremities.  May be very mild facial droop however looking at the photo in his chart this may be baseline.  No metria with finger-nose bilaterally.  Does have garbled speech.  Medical Decision Making  Medically screening exam initiated at 6:52 PM.  Appropriate orders placed.  Derrick [name] was informed that the remainder of the evaluation will be completed by another provider, this initial triage assessment does not replace that evaluation, and the importance of remaining in the ED until their evaluation is complete.    Due to new onset of symptoms around 5:30 PM, code stroke was ordered.  Continues to have slurred speech per patient and his wife.   [name], [place] 04/21/22 1854

## 2022-04-21 NOTE — Consult Note (Addendum)
Code stroke activated at [facility]. EDP had already assessed pt and NCCT completed. No symptoms reported at this time. Active code stroke confirmed with staff. Dr Rory Percy paged at [phone] with a call back at [facility]. Plans to transfer to [facility] for closer monitoring. Off camera at [facility].

## 2022-04-21 NOTE — ED Provider Notes (Addendum)
[facility] DEPT Provider Note   CSN: [id number] Arrival date & time: 04/21/22  1845     History  No chief complaint on file.   Derrick [name] is a 71 y.o. male.  Patient brought in by family member.  She went to bed last night at 2100 and everything was completely fine.  He woke at 5 in the morning and noticed numbness to the left side of his face left upper extremity and left lower extremity mostly at his fingers and his toes.  This lasted for 20 minutes and then resolved.  Patient was fine up until 1830 wife was there with him when he dropped a bowl of water could not hold onto things with his left hand.  And had significant aphasia wrong words were coming out and then his speech became slurred.  Speech is still a little slurred but has improved significantly according to wife.  And patient's left upper extremity is improved.  It may be some slight weakness in the left hand still.  No numbness.  No visual changes.  Patient does not have a history of any prior CVAs.  Patient not on blood thinners.  Patient does not have a headache.  Patient does have a history of hypertension.  Patient former smoker quit in 1975.  Patient blood pressure on arrival was 179/102 temp 97.6 pulse 78 respiration 16 room air oxygen sats 99%       Home Medications Prior to Admission medications   Medication Sig Start Date End Date Taking? Authorizing Provider  albuterol (VENTOLIN HFA) 108 (90 Base) MCG/ACT inhaler TAKE 2 PUFFS BY MOUTH EVERY 6 HOURS AS NEEDED FOR WHEEZE OR SHORTNESS OF BREATH 01/12/22   McGowen, Adrian Blackwater, MD  Ascorbic Acid (VITAMIN C) 1000 MG tablet Take 1,000 mg by mouth at bedtime.    [provider]  atorvastatin (LIPITOR) 20 MG tablet Take 1 tablet (20 mg total) by mouth daily. 05/02/21   McGowen, Adrian Blackwater, MD  diclofenac (VOLTAREN) 75 MG EC tablet TAKE 1 TABLET BY MOUTH TWICE A DAY AS NEEDED FOR ARTHRITIS PAIN. 04/25/21   McGowen, Adrian Blackwater, MD   finasteride (PROSCAR) 5 MG tablet TAKE 1 TABLET BY MOUTH EVERYDAY AT BEDTIME 01/31/22   McGowen, Adrian Blackwater, MD  losartan (COZAAR) 100 MG tablet Take 1 tablet (100 mg total) by mouth daily. 05/02/21   McGowen, Adrian Blackwater, MD  metoprolol succinate (TOPROL-XL) 50 MG 24 hr tablet Take 1 tablet (50 mg total) by mouth daily. TAKE WITH OR IMMEDIATELY FOLLOWING A MEAL. 05/02/21   McGowen, Adrian Blackwater, MD  pantoprazole (PROTONIX) 40 MG tablet Take 1 tablet (40 mg total) by mouth daily. 01/31/22   McGowen, Adrian Blackwater, MD  tamsulosin (FLOMAX) 0.4 MG CAPS capsule TAKE 1 CAPSULE BY MOUTH EVERY DAY 10/28/21   McGowen, Adrian Blackwater, MD      Allergies    Patient has no known allergies.    Review of Systems   Review of Systems  Constitutional:  Negative for chills and fever.  HENT:  Negative for ear pain and sore throat.   Eyes:  Negative for pain and visual disturbance.  Respiratory:  Negative for cough and shortness of breath.   Cardiovascular:  Negative for chest pain and palpitations.  Gastrointestinal:  Negative for abdominal pain and vomiting.  Genitourinary:  Negative for dysuria and hematuria.  Musculoskeletal:  Negative for arthralgias and back pain.  Skin:  Negative for color change and rash.  Neurological:  Positive  for speech difficulty, weakness and numbness. Negative for seizures, syncope and headaches.  All other systems reviewed and are negative.   Physical Exam Updated Vital Signs BP (!) 167/89   Pulse 81   Temp 97.6 F (36.4 C) (Oral)   Resp (!) 23   Ht 1.702 m (_0 )   Wt 111.1 kg   SpO2 96%   BMI 38.37 kg/m  Physical Exam Vitals and nursing note reviewed.  Constitutional:      General: He is not in acute distress.    Appearance: Normal appearance. He is well-developed.  HENT:     Head: Normocephalic and atraumatic.     Mouth/Throat:     Mouth: Mucous membranes are moist.  Eyes:     Extraocular Movements: Extraocular movements intact.     Conjunctiva/sclera: Conjunctivae normal.      Pupils: Pupils are equal, round, and reactive to light.  Cardiovascular:     Rate and Rhythm: Normal rate and regular rhythm.     Heart sounds: No murmur heard. Pulmonary:     Effort: Pulmonary effort is normal. No respiratory distress.     Breath sounds: Normal breath sounds. No wheezing, rhonchi or rales.  Abdominal:     Palpations: Abdomen is soft.     Tenderness: There is no abdominal tenderness.  Musculoskeletal:        General: No swelling.     Cervical back: Normal range of motion and neck supple.     Right lower leg: No edema.     Left lower leg: No edema.  Skin:    General: Skin is warm and dry.     Capillary Refill: Capillary refill takes less than 2 seconds.  Neurological:     Mental Status: He is alert and oriented to person, place, and time.     Cranial Nerves: Cranial nerve deficit present.     Sensory: No sensory deficit.     Motor: Weakness present.  Psychiatric:        Mood and Affect: Mood normal.     ED Results / Procedures / Treatments   Labs (all labs ordered are listed, but only abnormal results are displayed) Labs Reviewed  DIFFERENTIAL - Abnormal; Notable for the following components:      Result Value   Monocytes Absolute 1.2 (*)    All other components within normal limits  CBG MONITORING, ED - Abnormal; Notable for the following components:   Glucose-Capillary 119 (*)    All other components within normal limits  CBC  PROTIME-INR  APTT  COMPREHENSIVE METABOLIC PANEL  ETHANOL  I-STAT CHEM 8, ED    EKG EKG Interpretation  Date/Time:  Friday April 21 2022 18:51:51 EST Ventricular Rate:  81 PR Interval:  155 QRS Duration: 105 QT Interval:  402 QTC Calculation: 467 R Axis:   171 Text Interpretation: Sinus rhythm Left posterior fascicular block Low voltage, precordial leads Abnormal R-wave progression, late transition Confirmed by Fredia Sorrow [phone]) on 04/21/2022 7:10:49 PM  Radiology CT HEAD CODE STROKE WO  CONTRAST  Result Date: 04/21/2022 CLINICAL DATA:  Code stroke. EXAM: CT HEAD WITHOUT CONTRAST TECHNIQUE: Contiguous axial images were obtained from the base of the skull through the vertex without intravenous contrast. RADIATION DOSE REDUCTION: This exam was performed according to the departmental dose-optimization program which includes automated exposure control, adjustment of the mA and/or kV according to patient size and/or use of iterative reconstruction technique. COMPARISON:  None Available. FINDINGS: Brain: There is no mass, hemorrhage or extra-axial  collection. The size and configuration of the ventricles and extra-axial CSF spaces are normal. The brain parenchyma is normal, without evidence of acute or chronic infarction. Vascular: No abnormal hyperdensity of the major intracranial arteries or dural venous sinuses. No intracranial atherosclerosis. Skull: The visualized skull base, calvarium and extracranial soft tissues are normal. Sinuses/Orbits: No fluid levels or advanced mucosal thickening of the visualized paranasal sinuses. No mastoid or middle ear effusion. The orbits are normal. ASPECTS [facility] Stroke Program Early CT Score) - Ganglionic level infarction (caudate, lentiform nuclei, internal capsule, insula, M1-M3 cortex): 7 - Supraganglionic infarction (M4-M6 cortex): 3 Total score (0-10 with 10 being normal): 10 IMPRESSION: 1. No acute intracranial abnormality. 2. ASPECTS is 10. These results were called by telephone at the time of interpretation on 04/21/2022 at 7:35 pm to Dr. Rogene Houston, Who verbally acknowledged these results. Electronically Signed   By: Ulyses Jarred M.D.   On: 04/21/2022 19:35    Procedures Procedures    Medications Ordered in ED Medications  sodium chloride flush (NS) 0.9 % injection 3 mL (has no administration in time range)    ED Course/ Medical Decision Making/ A&P                           Medical Decision Making Amount and/or Complexity of Data  Reviewed Radiology: ordered.  Risk Decision regarding hospitalization.   CRITICAL CARE Performed by: Fredia Sorrow Total critical care time: 60 minutes Critical care time was exclusive of separately billable procedures and treating other patients. Critical care was necessary to treat or prevent imminent or life-threatening deterioration. Critical care was time spent personally by me on the following activities: development of treatment plan with patient and/or surrogate as well as nursing, discussions with consultants, evaluation of patient's response to treatment, examination of patient, obtaining history from patient or surrogate, ordering and performing treatments and interventions, ordering and review of laboratory studies, ordering and review of radiographic studies, pulse oximetry and re-evaluation of patient's condition.  Code stroke activated out in triage when patient arrived.  I met patient in the CT scanner.  Look to the patient when he came back from [place].  Patient had not been interviewed yet so we activated the telemedicine prompt her.  Patient last normal at 2100 then woke at 5 in the morning with a numbness to the left side of face left upper extremity and left lower extremity.  Which lasted 20 minutes from that point on then resolved.  Then had recurrent symptoms at 1830 that involve significant aphasia and weakness to the left upper extremity.  Most of that is improved there is still some slurred speech.  Maybe a little bit of weakness in the hand.  PET/CT according to radiology without any acute findings.  Teleneurology will interview.  Most likely based on the timing not a candidate for TNK at this point in time.  D/W with Dr. Rory Percy, he is recommending a CT a head and neck here along with perfusion studies.  And then transferred to the [place] ED will require medical admission.  But wants him to go to [facility] ED so he can get MRI quickly.  And wants that done over there.   Because he wants neurology to better see the patient over there.  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  CTA head and neck and perfusion study results are back.  They were reviewed by the neurologist.  I have contacted the hospitalist to  arrange admission at [facility].  We will go ahead and arrange ED to ED transfer to [facility] as per neurology's request.  We will go ahead and order for the MRI for there.  Other concern is that patient may have recurrent symptoms.  Apparently if he has recurrent symptoms he is a candidate for TNKase as they want him [place] for close monitoring.  Do not want him staying over here.  When patient arrives with contact neurohospitalist.  Medical hospitalist here has put in for admission to the hospitalist service there.  Patient excepted an ED to ED transfer by Dr. Darl Householder.  Final Clinical Impression(s) / ED Diagnoses Final diagnoses:  Cerebrovascular accident (CVA), unspecified mechanism [facility])    Rx / [place] Orders ED Discharge Orders     None         Fredia Sorrow, MD 04/21/22 1949    Fredia Sorrow, MD 04/21/22 2055    Fredia Sorrow, MD 04/21/22 2109

## 2022-04-21 NOTE — H&P (Addendum)
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  Per patient when he woke this morning around 6 AM he noticed his toes on his left foot went numb as well as his left fingers and his left cheek.  This resolved after approximately 20 minutes.  His mother-in-law was hospitalized, he visited, on the way home while driving, he noted his left fingers were getting numb.  While driving, he attempted to open a water bottle, he could not screw the cap off the bottle.  At home he went to give a bowl of water to the dogs, his left hand again became numb and he dropped the bowl to the floor.  He went to walk his dogs, on returning home, he spoke to his wife and his speech was slurred really badly.  He was sent to the ER.  The patient has no history of CVA, his systolic blood pressure at home is usually in the 120s.  Patient seen by teleneuro, who requested patient be admitted to [facility].  Review of Systems:  The patient denies anorexia, fever, weight loss,, vision loss, decreased hearing, hoarseness, chest pain, syncope, dyspnea on exertion, peripheral edema, balance deficits, hemoptysis, abdominal pain, melena, hematochezia, severe indigestion/heartburn, hematuria, incontinence, genital sores, muscle weakness, suspicious skin lesions, transient blindness, difficulty walking, depression, unusual weight change, abnormal bleeding, enlarged lymph nodes, angioedema, and breast masses. Positive: Left-sided weakness, numbness, slurred speech  Past Medical History: Past Medical History:  Diagnosis Date   BPH with obstruction/lower urinary tract symptoms    +Hx of acute urinary retention/acute cystitis: responding fairly well to tamsulosin and finasteride as of 09/2016 urol f/u.  Urolift and bladd bx 02/2018-->doing great as of 07/2019 urol f/u.   Deafness in  right ear    Erythrocytosis    stable dating back to 2009 (17-18 range). Iron normal. No s/s of OSA.  Epo normal.  Just following as long as stable as of 10/2020 (no hem/onc ref)   Gross hematuria    hematuria protocol w/u being done by urol as of their 01/23/18 note.   History of back injury    Back fracture fell from tree   History of erectile dysfunction    History of fracture of right ankle 05/2015   History of kidney stones    History of staph infection 10 yrs ago   after back surgery   HTN (hypertension)    Hyperlipidemia    Low back pain    Microhematuria    chronic; pt gets periodic cystoscopy for monitoring, most recent 07/30/20-->bx showed benign urothelium and submucosa, with eosinophilic cystitis.   Nephrolithiasis 2015   CT showed 2cm left sided stone which was stable since 2006 imaging and likely in renal parenchyma.  2.5 cm as of 08/2017 neph f/u: renal u/s 12/2017-->no hydro. KUB showed stable L sided 18 mm stone. Cystoscopy 12/2017 erythematous mucosa at dome, no mass.    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  Annotation: previous methicillin sensitive  staphylococcus aureus 2006 Qualifier: Diagnosis of  By: Ninetta Lights MD, Tinnie Gens  (ID Specialist)     Prostate cancer screening    via Urol--> PSA 1.39 April 2019. 1.36 Feb 2020.   Tinnitus, left ear    Past Surgical History:  Procedure Laterality Date  COLONOSCOPY  10/12/2015   repeat 5 years   COLONOSCOPY W/ POLYPECTOMY  2007   Negative 2012 and 2017; Dr Gita Kudo 2022.   CYSTOSCOPY     with bladder biopsy:  dome of bladder with an area of chronic inflammation.  No malignancy.   CYSTOSCOPY WITH BIOPSY N/A 02/19/2018   Procedure: CYSTOSCOPY WITH BIOPSY/ FULGURATION / UROLIFT;  Surgeon: Jerilee Field, MD;  Location: [facility];  Service: Urology;  Laterality: N/A;   CYSTOSCOPY WITH FULGERATION N/A 07/30/2020   Bx benign. Procedure:  CYSTOSCOPY WITH BLADDER BIOPSY/FULGERATION;  Surgeon: Jerilee Field, MD;  Location: [facility];  Service: Urology;  Laterality: N/A;   CYSTOSCOPY WITH INSERTION OF UROLIFT  02/19/2018   LUMBAR LAMINECTOMY     Dr Venetia Maxon performed 2nd & 3rd procedures (pt also has hx of fall and vertebral fracture years prior to his surgeries.   TONSILLECTOMY  as child    Medications: Prior to Admission medications   Medication Sig Start Date End Date Taking? Authorizing Provider  albuterol (VENTOLIN HFA) 108 (90 Base) MCG/ACT inhaler TAKE 2 PUFFS BY MOUTH EVERY 6 HOURS AS NEEDED FOR WHEEZE OR SHORTNESS OF BREATH 01/12/22   McGowen, Maryjean Morn, MD  Ascorbic Acid (VITAMIN C) 1000 MG tablet Take 1,000 mg by mouth at bedtime.    [provider]  atorvastatin (LIPITOR) 20 MG tablet Take 1 tablet (20 mg total) by mouth daily. 05/02/21   McGowen, Maryjean Morn, MD  diclofenac (VOLTAREN) 75 MG EC tablet TAKE 1 TABLET BY MOUTH TWICE A DAY AS NEEDED FOR ARTHRITIS PAIN. 04/25/21   McGowen, Maryjean Morn, MD  finasteride (PROSCAR) 5 MG tablet TAKE 1 TABLET BY MOUTH EVERYDAY AT BEDTIME 01/31/22   McGowen, Maryjean Morn, MD  losartan (COZAAR) 100 MG tablet Take 1 tablet (100 mg total) by mouth daily. 05/02/21   McGowen, Maryjean Morn, MD  metoprolol succinate (TOPROL-XL) 50 MG 24 hr tablet Take 1 tablet (50 mg total) by mouth daily. TAKE WITH OR IMMEDIATELY FOLLOWING A MEAL. 05/02/21   McGowen, Maryjean Morn, MD  pantoprazole (PROTONIX) 40 MG tablet Take 1 tablet (40 mg total) by mouth daily. 01/31/22   McGowen, Maryjean Morn, MD  tamsulosin (FLOMAX) 0.4 MG CAPS capsule TAKE 1 CAPSULE BY MOUTH EVERY DAY 10/28/21   McGowen, Maryjean Morn, MD    Allergies:  No Known Allergies  Social History:  reports that he quit smoking about 48 years ago. His smoking use included cigarettes. He has a 40.00 pack-year smoking history. His smokeless tobacco use includes chew. He reports that he does not currently use alcohol. He reports that he does not  use drugs.  Family History: Family History  Problem Relation Age of Onset   Stroke Mother    Cancer Mother 78       melanoma   Cancer Father 68       lung cancer; [employer] 2 (nonsmoker)   Heart disease Maternal Grandmother    Heart disease Maternal Grandfather    Heart disease Paternal Grandmother    Cancer Brother        bladder cancer    Physical Exam: Vitals:   04/21/22 1913 04/21/22 1915 04/21/22 1941 04/21/22 1945  BP: (!) 169/87 (!) 147/85 (!) 167/89 (!) 144/92  Pulse: 78 77 81 80  Resp: 17 17 (!) 23 (!) 23  Temp:      TempSrc:      SpO2: 95% 95% 96% 95%  Weight:      Height:  General:  Alert and oriented times three, well developed and nourished, no acute distress Eyes: PERRLA, pink conjunctiva, no scleral icterus ENT: Moist oral mucosa, neck supple, no thyromegaly Lungs: clear to ascultation, no wheeze, no crackles, no use of accessory muscles Cardiovascular: regular rate and rhythm, no regurgitation, no gallops, no murmurs. No carotid bruits, no JVD Abdomen: soft, positive BS, non-tender, non-distended, no organomegaly, not an acute abdomen GU: not examined Neuro: CN II - XII grossly intact, sensation intact Musculoskeletal: strength 5/5 all extremities, no clubbing, cyanosis or edema Skin: no rash, no subcutaneous crepitation, no decubitus Psych: appropriate patient   Labs on Admission:  Recent Labs    04/21/22 1926 04/21/22 1945  NA 132* 141  K 3.5 3.8  CL 102 105  CO2 25  --   GLUCOSE 117* 107*  BUN 15 14  CREATININE 0.92 0.80  CALCIUM 8.0*  --    Recent Labs    04/21/22 1926  AST 21  ALT 21  ALKPHOS 71  BILITOT 0.8  PROT 7.2  ALBUMIN 3.7   No results for input(s): "LIPASE", "AMYLASE" in the last 72 hours. Recent Labs    04/21/22 1926 04/21/22 1945  WBC 10.1  --   NEUTROABS 5.7  --   HGB 16.7 16.3  HCT 49.6 48.0  MCV 89.5  --   PLT 193  --      Radiological Exams on Admission: CT HEAD CODE STROKE WO  CONTRAST  Result Date: 04/21/2022 CLINICAL DATA:  Code stroke. EXAM: CT HEAD WITHOUT CONTRAST TECHNIQUE: Contiguous axial images were obtained from the base of the skull through the vertex without intravenous contrast. RADIATION DOSE REDUCTION: This exam was performed according to the departmental dose-optimization program which includes automated exposure control, adjustment of the mA and/or kV according to patient size and/or use of iterative reconstruction technique. COMPARISON:  None Available. FINDINGS: Brain: There is no mass, hemorrhage or extra-axial collection. The size and configuration of the ventricles and extra-axial CSF spaces are normal. The brain parenchyma is normal, without evidence of acute or chronic infarction. Vascular: No abnormal hyperdensity of the major intracranial arteries or dural venous sinuses. No intracranial atherosclerosis. Skull: The visualized skull base, calvarium and extracranial soft tissues are normal. Sinuses/Orbits: No fluid levels or advanced mucosal thickening of the visualized paranasal sinuses. No mastoid or middle ear effusion. The orbits are normal. ASPECTS [facility] Stroke Program Early CT Score) - Ganglionic level infarction (caudate, lentiform nuclei, internal capsule, insula, M1-M3 cortex): 7 - Supraganglionic infarction (M4-M6 cortex): 3 Total score (0-10 with 10 being normal): 10 IMPRESSION: 1. No acute intracranial abnormality. 2. ASPECTS is 10. These results were called by telephone at the time of interpretation on 04/21/2022 at 7:35 pm to Dr. Deretha Emory, Who verbally acknowledged these results. Electronically Signed   By: Deatra Robinson M.D.   On: 04/21/2022 19:35    Assessment/Plan Present on Admission: CVA -Admit to med telemetry -CVA order set initiated -Aspirin, lipid panel, high-dose statin -Neurochecks every hour -MRI head, 2D echo in a.m. -Neuro consult per a.m. team -Allow for passive hypertension -MRI to be done at [facility]    Hyperlipidemia -Stable, statin ordered  HTN -Passive hypertension  GERD -Protonix resumed   Chanette Demo 04/21/2022, 8:16 PM

## 2022-04-21 NOTE — ED Notes (Signed)
Patient transported to CT 

## 2022-04-21 NOTE — ED Triage Notes (Signed)
Patient reports that at 0500 today he had numbness in his left hand and leg. Patient states it resolved after 20 minutes. At 1730 today, the patient's wife reports that he dropped a bowl of water and had slurred speech. Wife states slurred speech is not as bad as it was, but not "quite right."

## 2022-04-22 ENCOUNTER — Inpatient Hospital Stay (HOSPITAL_BASED_OUTPATIENT_CLINIC_OR_DEPARTMENT_OTHER): Payer: Medicare Other

## 2022-04-22 ENCOUNTER — Inpatient Hospital Stay (HOSPITAL_COMMUNITY): Payer: Medicare Other

## 2022-04-22 DIAGNOSIS — R202 Paresthesia of skin: Secondary | ICD-10-CM | POA: Diagnosis not present

## 2022-04-22 DIAGNOSIS — Z87891 Personal history of nicotine dependence: Secondary | ICD-10-CM | POA: Diagnosis not present

## 2022-04-22 DIAGNOSIS — R200 Anesthesia of skin: Secondary | ICD-10-CM | POA: Diagnosis not present

## 2022-04-22 DIAGNOSIS — I161 Hypertensive emergency: Secondary | ICD-10-CM

## 2022-04-22 DIAGNOSIS — Z79899 Other long term (current) drug therapy: Secondary | ICD-10-CM | POA: Diagnosis not present

## 2022-04-22 DIAGNOSIS — I639 Cerebral infarction, unspecified: Secondary | ICD-10-CM | POA: Insufficient documentation

## 2022-04-22 DIAGNOSIS — I10 Essential (primary) hypertension: Secondary | ICD-10-CM | POA: Diagnosis not present

## 2022-04-22 DIAGNOSIS — G459 Transient cerebral ischemic attack, unspecified: Secondary | ICD-10-CM | POA: Diagnosis not present

## 2022-04-22 LAB — CBC
HCT: 46.8 % (ref 39.0–52.0)
Hemoglobin: 15.6 g/dL (ref 13.0–17.0)
MCH: 29.5 pg (ref 26.0–34.0)
MCHC: 33.3 g/dL (ref 30.0–36.0)
MCV: 88.5 fL (ref 80.0–100.0)
Platelets: 188 10*3/uL (ref 150–400)
RBC: 5.29 MIL/uL (ref 4.22–5.81)
RDW: 14.7 % (ref 11.5–15.5)
WBC: 8.5 10*3/uL (ref 4.0–10.5)
nRBC: 0 % (ref 0.0–0.2)

## 2022-04-22 LAB — BASIC METABOLIC PANEL
Anion gap: 8 (ref 5–15)
BUN: 13 mg/dL (ref 8–23)
CO2: 23 mmol/L (ref 22–32)
Calcium: 8.5 mg/dL — ABNORMAL LOW (ref 8.9–10.3)
Chloride: 108 mmol/L (ref 98–111)
Creatinine, Ser: 1.01 mg/dL (ref 0.61–1.24)
GFR, Estimated: >60 mL/min (ref >60)
Glucose, Bld: 101 mg/dL — ABNORMAL HIGH (ref 70–99)
Potassium: 4.1 mmol/L (ref 3.5–5.1)
Sodium: 139 mmol/L (ref 135–145)

## 2022-04-22 LAB — LIPID PANEL
Cholesterol: 118 mg/dL (ref 0–200)
HDL: 33 mg/dL — ABNORMAL LOW (ref >40)
LDL Cholesterol: 70 mg/dL (ref 0–99)
Total CHOL/HDL Ratio: 3.6 RATIO
Triglycerides: 74 mg/dL (ref <150)
VLDL: 15 mg/dL (ref 0–40)

## 2022-04-22 LAB — ECHOCARDIOGRAM COMPLETE
Area-P 1/2: 3.2 cm2
Height: 67 in
S' Lateral: 2.4 cm
Weight: 3920 oz

## 2022-04-22 MED ORDER — ASPIRIN 81 MG PO TBEC: 81.0000 mg | DELAYED_RELEASE_TABLET | Freq: Every day | ORAL | 11 refills | Status: DC

## 2022-04-22 MED ORDER — ATORVASTATIN CALCIUM 80 MG PO TABS
80.0000 mg | ORAL_TABLET | Freq: Every day | ORAL | Status: DC
  Administered 2022-04-22: 80 mg via ORAL
  Filled 2022-04-22: qty 1

## 2022-04-22 MED ORDER — ATORVASTATIN CALCIUM 80 MG PO TABS: 80.0000 mg | ORAL_TABLET | Freq: Every day | ORAL | 1 refills | Status: DC

## 2022-04-22 MED ORDER — ASPIRIN 325 MG PO TBEC
325.0000 mg | DELAYED_RELEASE_TABLET | Freq: Every day | ORAL | Status: DC
  Administered 2022-04-22: 325 mg via ORAL
  Filled 2022-04-22: qty 1

## 2022-04-22 NOTE — Evaluation (Signed)
Occupational Therapy Evaluation Patient Details Name: Derrick [name] MRN: [id number] DOB: Oct 29, 1950 Today's Date: 04/22/2022   History of Present Illness Patient is 71 y.o. 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 Per patient when he morning of 12/1 around 6 AM he noticed his toes on his left foot went numb as well as his left fingers and his left cheek.  This resolved after approximately 20 minutes.  His mother-in-law was hospitalized, he visited, on the way home while driving, he noted his left fingers were getting numb.  While driving, he attempted to open a water bottle, he could not screw the cap off the bottle.  At home he went to give a bowl of water to the dogs, his left hand again became numb and he dropped the bowl to the floor.  He went to walk his dogs, on returning home, he spoke to his wife and his speech was slurred really badly.  He was sent to the ER.  The patient has no history of CVA, his systolic blood pressure at home is usually in the 120s. CT work up reveals small area of ischemia corresponds to a superficial watershed  pattern at the right ACA/MCA and MCA/PCA junctions. MRI negative for acute CVA.   Clinical Impression   Pt admitted with the above. Pt currently with functional limitations due to the deficits listed below (see OT Problem List).  Pt will benefit from skilled OT to increase their safety and independence with ADL and functional mobility for ADL to facilitate discharge to venue listed below.        Recommendations for follow up therapy are one component of a multi-disciplinary discharge planning process, led by the attending physician.  Recommendations may be updated based on patient status, additional functional criteria and insurance authorization.   Follow Up Recommendations  No OT follow up     Assistance Recommended at Discharge Intermittent Supervision/Assistance  Patient can return home with  the following A little help with bathing/dressing/bathroom    Functional Status Assessment  Patient has had a recent decline in their functional status and demonstrates the ability to make significant improvements in function in a reasonable and predictable amount of time.  Equipment Recommendations  None recommended by OT       Precautions / Restrictions Precautions Precautions: Fall Restrictions Weight Bearing Restrictions: No      Mobility Bed Mobility               General bed mobility comments: pt sitting EOB  on arrival    Transfers Overall transfer level: Needs assistance Equipment used: None Transfers: Sit to/from Stand Sit to Stand: Min guard, Supervision           General transfer comment: guard/supervision for sit<>stand from EOB      Balance Overall balance assessment: Mild deficits observed, not formally tested                                         ADL either performed or assessed with clinical judgement   ADL Overall ADL's : Needs assistance/impaired Eating/Feeding: Set up;Sitting   Grooming: Set up;Standing   Upper Body Bathing: Set up;Sitting   Lower Body Bathing: Minimal assistance   Upper Body Dressing : Set up;Sitting   Lower Body Dressing: Minimal assistance;Sit to/from stand;Cueing for safety;Cueing for sequencing   Toilet  Transfer: Designer, fashion/clothing and Hygiene: Supervision/safety       Functional mobility during ADLs: Min guard       Vision Patient Visual Report: No change from baseline              Pertinent Vitals/Pain Pain Assessment Pain Assessment: No/denies pain     Hand Dominance Right   Extremity/Trunk Assessment Upper Extremity Assessment Upper Extremity Assessment: Overall WFL for tasks assessed;Defer to OT evaluation   Lower Extremity Assessment Lower Extremity Assessment: Overall WFL for tasks assessed;LLE deficits/detail LLE Deficits / Details:  slight weakness with hip flexion, knee extension compared to Rt LE LLE Sensation: WNL LLE Coordination: WNL   Cervical / Trunk Assessment Cervical / Trunk Assessment: Normal   Communication Communication Communication: Expressive difficulties (some slurred speech)   Cognition Arousal/Alertness: Awake/alert Behavior During Therapy: WFL for tasks assessed/performed, Restless Overall Cognitive Status: Within Functional Limits for tasks assessed                                                  Home Living Family/patient expects to be discharged to:: Private residence Living Arrangements: Spouse/significant other Available Help at Discharge: Family Type of Home: House Home Access: Stairs to enter [employer] of Steps: pt does climb in a deer stand   Home Layout: One level     Bathroom Shower/Tub: Teacher, early years/pre: Standard     Home Equipment: Cane - single point   Additional Comments: 3 walking sticks      Prior Functioning/Environment Prior Level of Function : Independent/Modified Independent             Mobility Comments: enjoys hunting; pt does climb in a deer stand. pt had a significant fall last year whiel in a deer stand in [place], had to have multiple back surgeries after injury. ADLs Comments: independent        OT Problem List: Decreased strength      OT Treatment/Interventions: Self-care/ADL training;Therapeutic activities;Patient/family education    OT Goals(Current goals can be found in the care plan section) Acute Rehab OT Goals Patient Stated Goal: get back to what i want to do OT Goal Formulation: With patient Time For Goal Achievement: 05/06/22 Potential to Achieve Goals: Good ADL Goals Pt Will Perform Lower Body Bathing: (P) with modified independence;sit to/from stand Pt Will Perform Lower Body Dressing: (P) with modified independence;sit to/from stand Pt Will Transfer to Toilet: (P) with  modified independence Pt Will Perform Toileting - Clothing Manipulation and hygiene: (P) with modified independence;sit to/from stand Pt Will Perform Tub/Shower Transfer: (P) Tub transfer;with modified independence  OT Frequency: Min 2X/week       AM-PAC OT "6 Clicks" Daily Activity     Outcome Measure Help from another person eating meals?: None Help from another person taking care of personal grooming?: None Help from another person toileting, which includes using toliet, bedpan, or urinal?: A Little Help from another person bathing (including washing, rinsing, drying)?: A Little Help from another person to put on and taking off regular upper body clothing?: None Help from another person to put on and taking off regular lower body clothing?: A Little 6 Click Score: 21   End of Session Equipment Utilized During Treatment: Gait belt Nurse Communication: Mobility status  Activity Tolerance: Patient tolerated treatment well Patient left:  in bed;with call bell/phone within reach;with nursing/sitter in room  OT Visit Diagnosis: Unsteadiness on feet (R26.81)                Time: 1292-9090 OT Time Calculation (min): 20 min Charges:  OT General Charges $OT Visit: 1 Visit OT Evaluation $OT Eval Low Complexity: 1 Low  Kari Baars, OT Acute Rehabilitation Services [phone] Office- [phone]     Glenn Christo, Edwena Felty D 04/22/2022, 1:05 PM

## 2022-04-22 NOTE — Care Management Obs Status (Signed)
[facility] NOTIFICATION   Patient Details  Name: Derrick [name] MRN: [id number] Date of Birth: 1950/07/23   Medicare Observation Status Notification Given:  Yes    Carles Collet, RN 04/22/2022, 1:41 PM

## 2022-04-22 NOTE — Progress Notes (Signed)
STROKE TEAM PROGRESS NOTE   INTERVAL HISTORY His wife is at the bedside.  Presented 12/1 with c/o slurred speech, and intermittent left-sided tingling & numbness. Patient reported he took his BP at this time and it was elevated, in the 170s.  Transferred from [place] for MRI and stroke work-up. MRI was negative for acute CVA.   Vitals:   04/22/22 0145 04/22/22 0344 04/22/22 0745 04/22/22 1139  BP: 117/77 115/77 131/86 129/72  Pulse: 65 63 68 77  Resp: '15 12 19 '$ (!) 22  Temp: (!) 97.5 F (36.4 C) 97.7 F (36.5 C) 98.2 F (36.8 C) 98.4 F (36.9 C)  TempSrc: Oral Oral Oral Oral  SpO2: 94% 94% 94% 94%  Weight:      Height:       CBC:  Recent Labs  Lab 04/21/22 1926 04/21/22 1945 04/21/22 2228 04/22/22 0236  WBC 10.1  --  10.2 8.5  NEUTROABS 5.7  --   --   --   HGB 16.7   < > 14.7 15.6  HCT 49.6   < > 44.3 46.8  MCV 89.5  --  89.9 88.5  PLT 193  --  191 188   < > = values in this interval not displayed.   Basic Metabolic Panel:  Recent Labs  Lab 04/21/22 1926 04/21/22 1945 04/21/22 2228 04/22/22 0236  NA 132* 141  --  139  K 3.5 3.8  --  4.1  CL 102 105  --  108  CO2 25  --   --  23  GLUCOSE 117* 107*  --  101*  BUN 15 14  --  13  CREATININE 0.92 0.80 1.16 1.01  CALCIUM 8.0*  --   --  8.5*   Lipid Panel:  Recent Labs  Lab 04/22/22 0236  CHOL 118  TRIG 74  HDL 33*  CHOLHDL 3.6  VLDL 15  LDLCALC 70   HgbA1c: No results for input(s): "HGBA1C" in the last 168 hours. Reordered/Pending  Urine Drug Screen:  Recent Labs  Lab 04/21/22 2228  LABOPIA NONE DETECTED  COCAINSCRNUR NONE DETECTED  LABBENZ NONE DETECTED  AMPHETMU NONE DETECTED  THCU NONE DETECTED  LABBARB NONE DETECTED    Alcohol Level  Recent Labs  Lab 04/21/22 1926  ETH <10    IMAGING past 24 hours MR BRAIN WO CONTRAST  Result Date: 04/22/2022 CLINICAL DATA:  TIA.  Left-sided numbness and tingling EXAM: MRI HEAD WITHOUT CONTRAST TECHNIQUE: Multiplanar, multiecho pulse sequences of  the brain and surrounding structures were obtained without intravenous contrast. COMPARISON:  None Available. FINDINGS: Brain: No acute infarction, hemorrhage, hydrocephalus, extra-axial collection or mass lesion. Mild chronic small vessel ischemia in the deep cerebral white matter and pons. Vascular: Normal flow voids. Skull and upper cervical spine: Normal marrow signal Sinuses/Orbits: Opacification of right-sided maxillary, ethmoid, sphenoid, and frontal sinuses due to nasal cavity obstruction. IMPRESSION: 1. No acute finding including infarct. 2. Chronic small vessel ischemia in the cerebral white matter. 3. Active sinusitis with diffuse obstruction of right-sided air cells, new from 2019. Electronically Signed   By: Jorje Guild M.D.   On: 04/22/2022 09:41   CT ANGIO HEAD NECK W WO CM W PERF (CODE STROKE)  Result Date: 04/21/2022 CLINICAL DATA:  Left-sided numbness and tingling EXAM: CT ANGIOGRAPHY HEAD AND NECK CT PERFUSION BRAIN TECHNIQUE: Multidetector CT imaging of the head and neck was performed using the standard protocol during bolus administration of intravenous contrast. Multiplanar CT image reconstructions and MIPs were obtained  to evaluate the vascular anatomy. Carotid stenosis measurements (when applicable) are obtained utilizing NASCET criteria, using the distal internal carotid diameter as the denominator. Multiphase CT imaging of the brain was performed following IV bolus contrast injection. Subsequent parametric perfusion maps were calculated using RAPID software. RADIATION DOSE REDUCTION: This exam was performed according to the departmental dose-optimization program which includes automated exposure control, adjustment of the mA and/or kV according to patient size and/or use of iterative reconstruction technique. CONTRAST:  155m OMNIPAQUE IOHEXOL 350 MG/ML SOLN COMPARISON:  None Available. FINDINGS: CTA NECK FINDINGS SKELETON: There is no bony spinal canal stenosis. No lytic or blastic  lesion. OTHER NECK: Normal pharynx, larynx and major salivary glands. No cervical lymphadenopathy. Unremarkable thyroid gland. UPPER CHEST: No pneumothorax or pleural effusion. No nodules or masses. AORTIC ARCH: There is no calcific atherosclerosis of the aortic arch. There is no aneurysm, dissection or hemodynamically significant stenosis of the visualized portion of the aorta. Conventional 3 vessel aortic branching pattern. The visualized proximal subclavian arteries are widely patent. RIGHT CAROTID SYSTEM: Normal without aneurysm, dissection or stenosis. LEFT CAROTID SYSTEM: Normal without aneurysm, dissection or stenosis. VERTEBRAL ARTERIES: Left dominant configuration. Both origins are clearly patent. There is no dissection, occlusion or flow-limiting stenosis to the skull base (V1-V3 segments). CTA HEAD FINDINGS POSTERIOR CIRCULATION: --Vertebral arteries: Normal V4 segments. --Inferior cerebellar arteries: Normal. --Basilar artery: Normal. --Superior cerebellar arteries: Normal. --Posterior cerebral arteries (PCA): Normal. ANTERIOR CIRCULATION: --Intracranial internal carotid arteries: Normal. --Anterior cerebral arteries (ACA): Normal. Both A1 segments are present. Patent anterior communicating artery (a-comm). --Middle cerebral arteries (MCA): Normal. VENOUS SINUSES: As permitted by contrast timing, patent. ANATOMIC VARIANTS: None Review of the MIP images confirms the above findings. Right-sided pansinusitis. CT Brain Perfusion Findings: ASPECTS: 10 CBF (<30%) Volume: 021mPerfusion (Tmax>6.0s) volume: 1059mismatch Volume: 64m95mfarction Location:Area of ischemia corresponds to a superficial watershed pattern at the right ACA/MCA and MCA/PCA junctions. IMPRESSION: 1. No emergent large vessel occlusion or high-grade stenosis of the intracranial or cervical arteries. 2. Small area of ischemia corresponds to a superficial watershed pattern at the right ACA/MCA and MCA/PCA junctions. Discussed with Dr. AshiAmie [place] telephone at 8:36 p.m. on 04/21/2022. Electronically Signed   By: KeviUlyses Jarred.   On: 04/21/2022 20:37   CT HEAD CODE STROKE WO CONTRAST  Result Date: 04/21/2022 CLINICAL DATA:  Code stroke. EXAM: CT HEAD WITHOUT CONTRAST TECHNIQUE: Contiguous axial images were obtained from the base of the skull through the vertex without intravenous contrast. RADIATION DOSE REDUCTION: This exam was performed according to the departmental dose-optimization program which includes automated exposure control, adjustment of the mA and/or kV according to patient size and/or use of iterative reconstruction technique. COMPARISON:  None Available. FINDINGS: Brain: There is no mass, hemorrhage or extra-axial collection. The size and configuration of the ventricles and extra-axial CSF spaces are normal. The brain parenchyma is normal, without evidence of acute or chronic infarction. Vascular: No abnormal hyperdensity of the major intracranial arteries or dural venous sinuses. No intracranial atherosclerosis. Skull: The visualized skull base, calvarium and extracranial soft tissues are normal. Sinuses/Orbits: No fluid levels or advanced mucosal thickening of the visualized paranasal sinuses. No mastoid or middle ear effusion. The orbits are normal. ASPECTS ([facility] Early CT Score) - Ganglionic level infarction (caudate, lentiform nuclei, internal capsule, insula, M1-M3 cortex): 7 - Supraganglionic infarction (M4-M6 cortex): 3 Total score (0-10 with 10 being normal): 10 IMPRESSION: 1. No acute intracranial abnormality. 2. ASPECTS is 10. These results were  called by telephone at the time of interpretation on 04/21/2022 at 7:35 pm to Dr. Rogene Houston, Who verbally acknowledged these results. Electronically Signed   By: Ulyses Jarred M.D.   On: 04/21/2022 19:35    PHYSICAL EXAM  General: Awake alert in no distress Neurological exam NIH: 1 Awake alert oriented x 4.  Mild dysarthria. No aphasia., Cranial nerves  II through 12 intact.  Motor exam with no drift.  Sensation intact without extinction.   ASSESSMENT/PLAN Derrick [name] is a 71 y.o. 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 Per patient when he morning of 12/1 around 6 AM he noticed his toes on his left foot went numb as well as his left fingers and his left cheek. This resolved after approximately 20 minutes. On assessment today, patient is ambulating in room with no assistance/devices utilized. Patient still had some slurred speech, but he and his wife stated that it was improved.   Given negative MRI and improved symptoms, Neurology will ill sign off and clear patient for discharge. Patient will need to follow-up with outpatient neurology within 10-14 days.   TIA:    Code Stroke CT head: No acute abnormality. ASPECTS is 10. CTA head & neck No emergent large vessel occlusion or high-grade stenosis of the intracranial or cervical arteries. Small area of ischemia corresponds to a superficial watershed pattern at the right ACA/MCA and MCA/PCA junctions MRI: No acute finding including infarct. Chronic small vessel ischemia in the cerebral white matter. Active sinusitis with diffuse obstruction of right-sided air cells, new from 2019 2D Echo Pending LDL 70 HgbA1c No results found for requested labs within last 1095 days. Reordered/Pending VTE prophylaxis - lovenox    Diet   Diet Heart Room service appropriate? Yes; Fluid consistency: Thin   aspirin 81 mg daily prior to admission, now on aspirin 325 mg daily.  Therapy recommendations:  Outpatient PT and SLP Disposition:  Home  Hypertension Home meds: Toprol-XL '50mg'$  Stable Permissive hypertension (OK if < 220/120) but gradually normalize in 5-7 days Long-term BP goal normotensive  Hyperlipidemia Home meds: losartan. Atorvastatin started in hospital LDL 70, goal < 70 Continue statin at discharge  Diabetes type II   Home meds:  none HgbA1c Pending., goal < 7.0 CBGs Recent Labs    04/21/22 1916  GLUCAP 119*     Other Stroke Risk Factors Advanced Age >/= 58  Tobacco use. Advised to stop using chewing tobacco. Former smoker.  Obesity, Body mass index is 38.37 kg/m., BMI >/= 30 associated with increased stroke risk, recommend weight loss, diet and exercise as appropriate  Family hx stroke (Mother)    Hospital day # 1  Neurology will sign off on this patient. Please call with any further questions/concerns.   Rolan Bucco, DNP, AGACNP-BC [facility] Pager: [phone]  To contact Stroke Continuity provider, please refer to [email or web address] After hours, contact General Neurology

## 2022-04-22 NOTE — Plan of Care (Signed)
  Problem: Education: Goal: Knowledge of disease or condition will improve Outcome: Progressing Goal: Knowledge of secondary prevention will improve (MUST DOCUMENT ALL) Outcome: Progressing Goal: Knowledge of patient specific risk factors will improve [place]ta [place] N/A or DELETE if not current risk factor) Outcome: Progressing   Problem: Ischemic Stroke/TIA Tissue Perfusion: Goal: Complications of ischemic stroke/TIA will be minimized Outcome: Progressing   Problem: Ischemic Stroke/TIA Tissue Perfusion: Goal: Complications of ischemic stroke/TIA will be minimized Outcome: Progressing

## 2022-04-22 NOTE — Progress Notes (Signed)
  Echocardiogram 2D Echocardiogram has been performed.  Derrick [name] 04/22/2022, 10:08 AM

## 2022-04-22 NOTE — Progress Notes (Signed)
[facility] night cross cover note:   I was contacted by RN regarding patient's request for discontinuation of existing IV fluids, as he is eating and drinking without complication.   Per my chart review, VSS appear able. Presenting labs show no e/o aki, but are notable for acute hyponatremia, with presenting sodium of 132. It appears that the patient was started on NS @ 75 cc/hr around 2100 on 04/21/22, and has now been on these continuous IV fluids for approximately 6 hours.   I d\c'ed IVF's at this time, and ordered BMP to be checked this AM for further trending of serum Na level.      Babs Bertin, DO Hospitalist

## 2022-04-22 NOTE — Discharge Summary (Signed)
Physician Discharge Summary  Derrick [name] VOH:[id number] DOB: Sep 24, 1950 DOA: 04/21/2022  PCP: [name], MD  Admit date: 04/21/2022 Discharge date: 04/22/2022  Admitted From: Home Disposition:  Home  Recommendations for Outpatient Follow-up:  Follow up with PCP in 1-2 weeks  Home Health:None  Equipment/Devices:None  Discharge Condition:Stable  CODE STATUS:Full  Diet recommendation: Low salt low fat diet   Brief/Interim Summary: This is a 71 year old male with past medical history of hypertension, dyslipidemia, chronic back pain with chronic lumbar osteomyelitis.  Patient presents with acute recurrent parasthesias of the L foot, hand, cheek - waxing and waning throughout the day.   Imaging unremarkable for any acute findings, negative CT and MRI, at this time patient's symptoms have resolved likely TIA in nature due to profound hypertension/hypertensive emergency, risk factors discussed with patient, appreciate neurology insight recommendations - otherwise stable and agreeable for discharge home.  Close follow-up with PCP in the next 1 to 2 weeks.  Discussed dietary and lifestyle changes.  Recommend tobacco cessation as well.  Otherwise continue medications as below.  Discharge Diagnoses:  Principal Problem:   Hypertensive emergency Active Problems:   Hyperlipidemia   TIA (transient ischemic attack)    Discharge Instructions   Allergies as of 04/22/2022   No Known Allergies      Medication List     STOP taking these medications    losartan 100 MG tablet Commonly known as: COZAAR   metoprolol succinate 50 MG 24 hr tablet Commonly known as: TOPROL-XL       TAKE these medications    albuterol 108 (90 Base) MCG/ACT inhaler Commonly known as: VENTOLIN HFA TAKE 2 PUFFS BY MOUTH EVERY 6 HOURS AS NEEDED FOR WHEEZE OR SHORTNESS OF BREATH   aspirin EC 81 MG tablet Take 1 tablet (81 mg total) by mouth daily. Swallow whole.   atorvastatin 80 MG  tablet Commonly known as: LIPITOR Take 1 tablet (80 mg total) by mouth daily. Start taking on: April 23, 2022 What changed:  medication strength how much to take   diclofenac 75 MG EC tablet Commonly known as: VOLTAREN TAKE 1 TABLET BY MOUTH TWICE A DAY AS NEEDED FOR ARTHRITIS PAIN.   finasteride 5 MG tablet Commonly known as: PROSCAR TAKE 1 TABLET BY MOUTH EVERYDAY AT BEDTIME   pantoprazole 40 MG tablet Commonly known as: PROTONIX Take 1 tablet (40 mg total) by mouth daily.   tamsulosin 0.4 MG Caps capsule Commonly known as: FLOMAX TAKE 1 CAPSULE BY MOUTH EVERY DAY   vitamin C 1000 MG tablet Take 1,000 mg by mouth at bedtime.        No Known Allergies  Consultations: Neuro  Procedures/Studies: MR BRAIN WO CONTRAST  Result Date: 04/22/2022 CLINICAL DATA:  TIA.  Left-sided numbness and tingling EXAM: MRI HEAD WITHOUT CONTRAST TECHNIQUE: Multiplanar, multiecho pulse sequences of the brain and surrounding structures were obtained without intravenous contrast. COMPARISON:  None Available. FINDINGS: Brain: No acute infarction, hemorrhage, hydrocephalus, extra-axial collection or mass lesion. Mild chronic small vessel ischemia in the deep cerebral white matter and pons. Vascular: Normal flow voids. Skull and upper cervical spine: Normal marrow signal Sinuses/Orbits: Opacification of right-sided maxillary, ethmoid, sphenoid, and frontal sinuses due to nasal cavity obstruction. IMPRESSION: 1. No acute finding including infarct. 2. Chronic small vessel ischemia in the cerebral white matter. 3. Active sinusitis with diffuse obstruction of right-sided air cells, new from 2019. Electronically Signed   By: Jorje Guild M.D.   On: 04/22/2022 09:41   CT ANGIO  HEAD NECK W WO CM W PERF (CODE STROKE)  Result Date: 04/21/2022 CLINICAL DATA:  Left-sided numbness and tingling EXAM: CT ANGIOGRAPHY HEAD AND NECK CT PERFUSION BRAIN TECHNIQUE: Multidetector CT imaging of the head and neck was  performed using the standard protocol during bolus administration of intravenous contrast. Multiplanar CT image reconstructions and MIPs were obtained to evaluate the vascular anatomy. Carotid stenosis measurements (when applicable) are obtained utilizing NASCET criteria, using the distal internal carotid diameter as the denominator. Multiphase CT imaging of the brain was performed following IV bolus contrast injection. Subsequent parametric perfusion maps were calculated using RAPID software. RADIATION DOSE REDUCTION: This exam was performed according to the departmental dose-optimization program which includes automated exposure control, adjustment of the mA and/or kV according to patient size and/or use of iterative reconstruction technique. CONTRAST:  114m OMNIPAQUE IOHEXOL 350 MG/ML SOLN COMPARISON:  None Available. FINDINGS: CTA NECK FINDINGS SKELETON: There is no bony spinal canal stenosis. No lytic or blastic lesion. OTHER NECK: Normal pharynx, larynx and major salivary glands. No cervical lymphadenopathy. Unremarkable thyroid gland. UPPER CHEST: No pneumothorax or pleural effusion. No nodules or masses. AORTIC ARCH: There is no calcific atherosclerosis of the aortic arch. There is no aneurysm, dissection or hemodynamically significant stenosis of the visualized portion of the aorta. Conventional 3 vessel aortic branching pattern. The visualized proximal subclavian arteries are widely patent. RIGHT CAROTID SYSTEM: Normal without aneurysm, dissection or stenosis. LEFT CAROTID SYSTEM: Normal without aneurysm, dissection or stenosis. VERTEBRAL ARTERIES: Left dominant configuration. Both origins are clearly patent. There is no dissection, occlusion or flow-limiting stenosis to the skull base (V1-V3 segments). CTA HEAD FINDINGS POSTERIOR CIRCULATION: --Vertebral arteries: Normal V4 segments. --Inferior cerebellar arteries: Normal. --Basilar artery: Normal. --Superior cerebellar arteries: Normal. --Posterior  cerebral arteries (PCA): Normal. ANTERIOR CIRCULATION: --Intracranial internal carotid arteries: Normal. --Anterior cerebral arteries (ACA): Normal. Both A1 segments are present. Patent anterior communicating artery (a-comm). --Middle cerebral arteries (MCA): Normal. VENOUS SINUSES: As permitted by contrast timing, patent. ANATOMIC VARIANTS: None Review of the MIP images confirms the above findings. Right-sided pansinusitis. CT Brain Perfusion Findings: ASPECTS: 10 CBF (<30%) Volume: 049mPerfusion (Tmax>6.0s) volume: 1070mismatch Volume: 2m76mfarction Location:Area of ischemia corresponds to a superficial watershed pattern at the right ACA/MCA and MCA/PCA junctions. IMPRESSION: 1. No emergent large vessel occlusion or high-grade stenosis of the intracranial or cervical arteries. 2. Small area of ischemia corresponds to a superficial watershed pattern at the right ACA/MCA and MCA/PCA junctions. Discussed with Dr. AshiAmie [place] telephone at 8:36 p.m. on 04/21/2022. Electronically Signed   By: KeviUlyses Jarred.   On: 04/21/2022 20:37   CT HEAD CODE STROKE WO CONTRAST  Result Date: 04/21/2022 CLINICAL DATA:  Code stroke. EXAM: CT HEAD WITHOUT CONTRAST TECHNIQUE: Contiguous axial images were obtained from the base of the skull through the vertex without intravenous contrast. RADIATION DOSE REDUCTION: This exam was performed according to the departmental dose-optimization program which includes automated exposure control, adjustment of the mA and/or kV according to patient size and/or use of iterative reconstruction technique. COMPARISON:  None Available. FINDINGS: Brain: There is no mass, hemorrhage or extra-axial collection. The size and configuration of the ventricles and extra-axial CSF spaces are normal. The brain parenchyma is normal, without evidence of acute or chronic infarction. Vascular: No abnormal hyperdensity of the major intracranial arteries or dural venous sinuses. No intracranial  atherosclerosis. Skull: The visualized skull base, calvarium and extracranial soft tissues are normal. Sinuses/Orbits: No fluid levels or advanced mucosal thickening of the visualized  paranasal sinuses. No mastoid or middle ear effusion. The orbits are normal. ASPECTS [facility] Stroke Program Early CT Score) - Ganglionic level infarction (caudate, lentiform nuclei, internal capsule, insula, M1-M3 cortex): 7 - Supraganglionic infarction (M4-M6 cortex): 3 Total score (0-10 with 10 being normal): 10 IMPRESSION: 1. No acute intracranial abnormality. 2. ASPECTS is 10. These results were called by telephone at the time of interpretation on 04/21/2022 at 7:35 pm to Dr. Rogene Houston, Who verbally acknowledged these results. Electronically Signed   By: Ulyses Jarred M.D.   On: 04/21/2022 19:35     Subjective: No acute issues or events overnight, paresthesias resolved   Discharge Exam: Vitals:   04/22/22 0745 04/22/22 1139  BP: 131/86 129/72  Pulse: 68 77  Resp: 19 (!) 22  Temp: 98.2 F (36.8 C) 98.4 F (36.9 C)  SpO2: 94% 94%   Vitals:   04/22/22 0145 04/22/22 0344 04/22/22 0745 04/22/22 1139  BP: 117/77 115/77 131/86 129/72  Pulse: 65 63 68 77  Resp: '15 12 19 '$ (!) 22  Temp: (!) 97.5 F (36.4 C) 97.7 F (36.5 C) 98.2 F (36.8 C) 98.4 F (36.9 C)  TempSrc: Oral Oral Oral Oral  SpO2: 94% 94% 94% 94%  Weight:      Height:        General: Pt is alert, awake, not in acute distress Cardiovascular: RRR, S1/S2 +, no rubs, no gallops Respiratory: CTA bilaterally, no wheezing, no rhonchi Abdominal: Soft, NT, ND, bowel sounds + Extremities: no edema, no cyanosis    The results of significant diagnostics from this hospitalization (including imaging, microbiology, ancillary and laboratory) are listed below for reference.     Microbiology: No results found for this or any previous visit (from the past 240 hour(s)).   Labs: BNP (last 3 results) No results for input(s): "BNP" in the last 8760  hours. Basic Metabolic Panel: Recent Labs  Lab 04/21/22 1926 04/21/22 1945 04/21/22 2228 04/22/22 0236  NA 132* 141  --  139  K 3.5 3.8  --  4.1  CL 102 105  --  108  CO2 25  --   --  23  GLUCOSE 117* 107*  --  101*  BUN 15 14  --  13  CREATININE 0.92 0.80 1.16 1.01  CALCIUM 8.0*  --   --  8.5*   Liver Function Tests: Recent Labs  Lab 04/21/22 1926  AST 21  ALT 21  ALKPHOS 71  BILITOT 0.8  PROT 7.2  ALBUMIN 3.7   No results for input(s): "LIPASE", "AMYLASE" in the last 168 hours. No results for input(s): "AMMONIA" in the last 168 hours. CBC: Recent Labs  Lab 04/21/22 1926 04/21/22 1945 04/21/22 2228 04/22/22 0236  WBC 10.1  --  10.2 8.5  NEUTROABS 5.7  --   --   --   HGB 16.7 16.3 14.7 15.6  HCT 49.6 48.0 44.3 46.8  MCV 89.5  --  89.9 88.5  PLT 193  --  191 188   Cardiac Enzymes: No results for input(s): "CKTOTAL", "CKMB", "CKMBINDEX", "TROPONINI" in the last 168 hours. BNP: Invalid input(s): "POCBNP" CBG: Recent Labs  Lab 04/21/22 1916  GLUCAP 119*   D-Dimer No results for input(s): "DDIMER" in the last 72 hours. Hgb A1c No results for input(s): "HGBA1C" in the last 72 hours. Lipid Profile Recent Labs    04/22/22 0236  CHOL 118  HDL 33*  LDLCALC 70  TRIG 74  CHOLHDL 3.6   Thyroid function studies No results for  input(s): "TSH", "T4TOTAL", "T3FREE", "THYROIDAB" in the last 72 hours.  Invalid input(s): "FREET3" Anemia work up No results for input(s): "VITAMINB12", "FOLATE", "FERRITIN", "TIBC", "IRON", "RETICCTPCT" in the last 72 hours. Urinalysis    Component Value Date/Time   COLORURINE YELLOW 06/05/2017 1527   APPEARANCEUR HAZY (A) 06/05/2017 1527   LABSPEC 1.018 06/05/2017 1527   PHURINE 5.0 06/05/2017 1527   GLUCOSEU NEGATIVE 06/05/2017 1527   HGBUR MODERATE (A) 06/05/2017 1527   BILIRUBINUR NEGATIVE 06/05/2017 1527   KETONESUR NEGATIVE 06/05/2017 1527   PROTEINUR NEGATIVE 06/05/2017 1527   NITRITE NEGATIVE 06/05/2017 1527    LEUKOCYTESUR LARGE (A) 06/05/2017 1527   Sepsis Labs Recent Labs  Lab 04/21/22 1926 04/21/22 2228 04/22/22 0236  WBC 10.1 10.2 8.5   Microbiology No results found for this or any previous visit (from the past 240 hour(s)).   Time coordinating discharge: Over 30 minutes  SIGNED:   Little Ishikawa, DO [facility] 04/22/2022, 1:14 PM Pager   If 7PM-7AM, please contact night-coverage [email or web address]

## 2022-04-22 NOTE — Plan of Care (Signed)

## 2022-04-22 NOTE — TOC Transition Note (Signed)
Transition of Care [facility]) - CM/SW Discharge Note   Patient Details  Name: Derrick [name] MRN: [id number] Date of Birth: [birth year]  Transition of [facility]) CM/SW Contact:  Carles Collet, RN Phone Number: 04/22/2022, 1:42 PM   Clinical Narrative:     Derrick [name] w patient at bedside. He declines HH and outpatient PT OT. He denies need for additional DME at home  No other TOC needs identified        Patient Goals and CMS Choice        Discharge Placement                       Discharge Plan and Services                                     Social Determinants of Health (SDOH) Interventions     Readmission Risk Interventions     No data to display

## 2022-04-22 NOTE — Care Management CC44 (Signed)
Condition Code 44 Documentation Completed  Patient Details  Name: Derrick [name] MRN: [id number] Date of Birth: 13-Jul-1950   Condition Code 44 given:  Yes Patient signature on Condition Code 44 notice:  Yes Documentation of 2 MD's agreement:  Yes Code 44 added to claim:  Yes    Carles Collet, RN 04/22/2022, 1:41 PM

## 2022-04-22 NOTE — Progress Notes (Incomplete)
Received from [employer]. Oriented to room and surroundings.

## 2022-04-22 NOTE — Evaluation (Signed)
Physical Therapy Evaluation Patient Details Name: Derrick [name] MRN: [id number] DOB: 01-Apr-1951 Today's Date: 04/22/2022  History of Present Illness  Patient is 71 y.o. 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 Per patient when he morning of 12/1 around 6 AM he noticed his toes on his left foot went numb as well as his left fingers and his left cheek.  This resolved after approximately 20 minutes.  His mother-in-law was hospitalized, he visited, on the way home while driving, he noted his left fingers were getting numb.  While driving, he attempted to open a water bottle, he could not screw the cap off the bottle.  At home he went to give a bowl of water to the dogs, his left hand again became numb and he dropped the bowl to the floor.  He went to walk his dogs, on returning home, he spoke to his wife and his speech was slurred really badly.  He was sent to the ER.  The patient has no history of CVA, his systolic blood pressure at home is usually in the 120s. CT work up reveals small area of ischemia corresponds to a superficial watershed  pattern at the right ACA/MCA and MCA/PCA junctions. MRI negative for acute CVA.   Clinical Impression  Derrick [name] is 71 y.o. male admitted with above HPI and diagnosis. Patient is currently limited by functional impairments below (see PT problem list). Patient lives with his wife and is independent at baseline. Overall pt mobilizing well, min guard/supervision for transfers. Pt steady through majority of gait with guarding for safety and no AD. Pt had 2 major forward LOB episodes due to catching toe while walking. Pt demonstrated safe reactions to correct balance and required min assist to prevent full LOB. Discussed this with patient and spouse and concern for safety, encouraged use of walking stick/SPC for ambulation to reduce risk of falls. Patient will benefit from continued skilled PT interventions to  address impairments and progress independence with mobility, recommending OPPT for balance training. Acute PT will follow and progress as able.        Recommendations for follow up therapy are one component of a multi-disciplinary discharge planning process, led by the attending physician.  Recommendations may be updated based on patient status, additional functional criteria and insurance authorization.  Follow Up Recommendations Outpatient PT (neuro PT)      Assistance Recommended at Discharge Intermittent Supervision/Assistance  Patient can return home with the following  A little help with walking and/or transfers;A little help with bathing/dressing/bathroom;Assist for transportation;Help with stairs or ramp for entrance    Equipment Recommendations None recommended by PT  Recommendations for Other Services       Functional Status Assessment Patient has had a recent decline in their functional status and demonstrates the ability to make significant improvements in function in a reasonable and predictable amount of time.     Precautions / Restrictions Precautions Precautions: Fall Restrictions Weight Bearing Restrictions: No      Mobility  Bed Mobility               General bed mobility comments: pt sitting EOB with OT on arrival    Transfers Overall transfer level: Needs assistance Equipment used: None Transfers: Sit to/from Stand Sit to Stand: Min guard, Supervision           General transfer comment: guard/supervision for sit<>stand from EOB    Ambulation/Gait Ambulation/Gait assistance: Min guard,  Min assist Gait Distance (Feet): 200 Feet Assistive device: 1 person hand held assist, None Gait Pattern/deviations: Step-through pattern, Decreased stride length, Decreased dorsiflexion - left, Decreased dorsiflexion - right Gait velocity: fair     General Gait Details: pt with wide BOS and low hip flexion throughout, min guard mostly with 2 episodes of  forward LOB due to toe catching and min assist required to prevent LOB.  Stairs Stairs: Yes Stairs assistance: Min guard Stair Management: One rail Left, Step to pattern, Forwards Number of Stairs: 3 General stair comments: overall steady, no LOB noted and pt clearing step for 6" and 4" steps with no difficulty  Wheelchair Mobility    Modified Rankin (Stroke Patients Only)       Balance                                             Pertinent Vitals/Pain Pain Assessment Pain Assessment: No/denies pain    Home Living Family/patient expects to be discharged to:: Private residence Living Arrangements: Spouse/significant other Available Help at Discharge: Family Type of Home: House Home Access: Stairs to enter   [employer] of Steps: pt does climb in a deer stand   Home Layout: One level Home Equipment: [place] - single point Additional Comments: 3 walking sticks    Prior Function Prior Level of Function : Independent/Modified Independent             Mobility Comments: enjoys hunting; pt does climb in a deer stand. pt had a significant fall last year whiel in a deer stand in [place], had to have multiple back surgeries after injury. ADLs Comments: independent     Hand Dominance   Dominant Hand: Right    Extremity/Trunk Assessment   Upper Extremity Assessment Upper Extremity Assessment: Overall WFL for tasks assessed;Defer to OT evaluation    Lower Extremity Assessment Lower Extremity Assessment: Overall WFL for tasks assessed;LLE deficits/detail LLE Deficits / Details: slight weakness with hip flexion, knee extension compared to Rt LE LLE Sensation: WNL LLE Coordination: WNL    Cervical / Trunk Assessment Cervical / Trunk Assessment: Normal  Communication   Communication: Expressive difficulties (some slurred speech)  Cognition Arousal/Alertness: Awake/alert Behavior During Therapy: WFL for tasks assessed/performed,  Restless Overall Cognitive Status: Within Functional Limits for tasks assessed                                          General Comments      Exercises     Assessment/Plan    PT Assessment Patient needs continued PT services  PT Problem List Decreased strength;Decreased activity tolerance;Decreased balance;Decreased mobility;Decreased safety awareness;Decreased knowledge of use of DME;Obesity       PT Treatment Interventions DME instruction;Gait training;Stair training;Functional mobility training;Therapeutic activities;Therapeutic exercise;Balance training;Neuromuscular re-education;Patient/family education    PT Goals (Current goals can be found in the Care Plan section)  Acute Rehab PT Goals Patient Stated Goal: get home and back to normal independence PT Goal Formulation: With patient/family Time For Goal Achievement: 05/06/22 Potential to Achieve Goals: Good    Frequency Min 3X/week     Co-evaluation               AM-PAC PT "6 Clicks" Mobility  Outcome Measure Help needed turning from your back to  your side while in a flat bed without using bedrails?: None Help needed moving from lying on your back to sitting on the side of a flat bed without using bedrails?: None Help needed moving to and from a bed to a chair (including a wheelchair)?: A Little Help needed standing up from a chair using your arms (e.g., wheelchair or bedside chair)?: A Little Help needed to walk in hospital room?: A Little Help needed climbing 3-5 steps with a railing? : A Little 6 Click Score: 20    End of Session Equipment Utilized During Treatment: Gait belt Activity Tolerance: Patient tolerated treatment well Patient left: in bed;with call bell/phone within reach;with family/visitor present Nurse Communication: Mobility status PT Visit Diagnosis: Unsteadiness on feet (R26.81);Muscle weakness (generalized) (M62.81);Difficulty in walking, not elsewhere classified  (R26.2)    Time: 1093-2355 PT Time Calculation (min) (ACUTE ONLY): 19 min   Charges:   PT Evaluation $PT Eval Low Complexity: 1 Low          Verner Mould, DPT Acute Rehabilitation Services Office [phone]  04/22/22 12:22 PM

## 2022-04-24 ENCOUNTER — Telehealth: Payer: Self-pay

## 2022-04-24 LAB — HEMOGLOBIN A1C
Hgb A1c MFr Bld: 5.6 % (ref 4.8–5.6)
Hgb A1c MFr Bld: 5.6 % (ref 4.8–5.6)
Mean Plasma Glucose: 114 mg/dL
Mean Plasma Glucose: 114 mg/dL

## 2022-04-24 NOTE — Telephone Encounter (Signed)
Provider agreed with 11:so slot for tomorrow. Please schedule pt

## 2022-04-24 NOTE — Telephone Encounter (Signed)
Spoke to patient.  Confirmed appt with Dr. Anitra Lauth for tomorrow 12/4 at 11:20AM

## 2022-04-24 NOTE — Telephone Encounter (Signed)
Derrick [name] was seen in ED over the weekend; initail thought was that he had a stroke, but all test results were normal. Suggested to follow up with PCP as soon as possible.  Dr. Anitra Lauth -  Can I schedule him tomorrow 12/5 in your 11:20AM (hospital follow up)?  This will be overbooking your schedule. First available "open" slot is next Tuesday 12/12.  Thank you!

## 2022-04-25 ENCOUNTER — Encounter: Payer: Self-pay | Admitting: Family Medicine

## 2022-04-25 ENCOUNTER — Ambulatory Visit (INDEPENDENT_AMBULATORY_CARE_PROVIDER_SITE_OTHER): Payer: Medicare Other | Admitting: Family Medicine

## 2022-04-25 VITALS — BP 154/82 | HR 99 | Temp 97.8°F | Ht 67.0 in | Wt 266.2 lb

## 2022-04-25 DIAGNOSIS — I10 Essential (primary) hypertension: Secondary | ICD-10-CM

## 2022-04-25 DIAGNOSIS — I639 Cerebral infarction, unspecified: Secondary | ICD-10-CM

## 2022-04-25 DIAGNOSIS — G459 Transient cerebral ischemic attack, unspecified: Secondary | ICD-10-CM | POA: Diagnosis not present

## 2022-04-25 MED ORDER — LOSARTAN POTASSIUM 100 MG PO TABS: 100.0000 mg | ORAL_TABLET | Freq: Every day | ORAL | 3 refills | Status: DC

## 2022-04-25 MED ORDER — METOPROLOL SUCCINATE ER 50 MG PO TB24: 50.0000 mg | ORAL_TABLET | Freq: Every day | ORAL | 3 refills | Status: DC

## 2022-04-25 NOTE — Progress Notes (Signed)
04/25/2022  CC:  Chief Complaint  Patient presents with   Follow-up    ED    Patient is a 71 y.o. male who presents for  hospital follow up. Dates hospitalized: 04/21/2022 to 04/22/2022. Days since d/c from hospital: 3 days Patient was discharged from hospital to home. Reason for admission to hospital: TIA and hypertensive urgency.  I have reviewed patient's discharge summary plus pertinent specific notes, labs, and imaging from the hospitalization.   Patient presented with acute recurrent paresthesias of the left foot, left hand, and cheek.  Also had slurred speech and aphasia as well as some left hand weakness. Head CT no acute disease. MRA head and neck was without emergent large vessel occlusion or high-grade stenosis of the intracranial or cervical arteries. There was a small area of ischemia which corresponded to a superficial watershed pattern at the right ACA/MCA and MCA/PCA junctions. Echocardiogram showed diastolic dysfunction, mild AR and moderate PR.  Otherwise normal.  CURRENTLY:  Patient has felt well since discharge home. Home blood pressures 120s over 70s. No tingling, numbness, dysarthria, or weakness.  Medication reconciliation was done today and patient is taking meds as recommended by discharging hospitalist/specialist.   Atorvastatin 80 mg a day, aspirin 81 mg a day, finasteride 5 mg a day, pantoprazole 40 mg a day, losartan 100 mg a day, Toprol-XL 50 mg a day, and Flomax 0.4 mg a day.  PMH:  Past Medical History:  Diagnosis Date   BPH with obstruction/lower urinary tract symptoms    +Hx of acute urinary retention/acute cystitis: responding fairly well to tamsulosin and finasteride as of 09/2016 urol f/u.  Urolift and bladd bx 02/2018-->doing great as of 07/2019 urol f/u.   Deafness in right ear    Erythrocytosis    stable dating back to 2009 (17-18 range). Iron normal. No s/s of OSA.  Epo normal.  Just following as long as stable as of 10/2020 (no hem/onc ref)    Gross hematuria    hematuria protocol w/u being done by urol as of their 01/23/18 note.   History of back injury    Back fracture fell from tree   History of erectile dysfunction    History of fracture of right ankle 05/2015   History of kidney stones    History of staph infection 10 yrs ago   after back surgery   HTN (hypertension)    Hyperlipidemia    Low back pain    Microhematuria    chronic; pt gets periodic cystoscopy for monitoring, most recent 07/30/20-->bx showed benign urothelium and submucosa, with eosinophilic cystitis.   Nephrolithiasis 2015   CT showed 2cm left sided stone which was stable since 2006 imaging and likely in renal parenchyma.  2.5 cm as of 08/2017 neph f/u: renal u/s 12/2017-->no hydro. KUB showed stable L sided 18 mm stone. Cystoscopy 12/2017 erythematous mucosa at dome, no mass.    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  Annotation: previous methicillin sensitive  staphylococcus aureus 2006 Qualifier: Diagnosis of  By: Johnnye Sima MD, Dellis Filbert  (ID Specialist)     Prostate cancer screening    via Urol--> PSA 1.39 April 2019. 1.36 Feb 2020.   Tinnitus, left ear     PSH:  Past Surgical History:  Procedure Laterality Date   COLONOSCOPY  10/12/2015   repeat 5 years   COLONOSCOPY W/ POLYPECTOMY  2007   Negative 2012 and 2017; Dr Ollen Barges  2022.   CYSTOSCOPY     with bladder biopsy:  dome of bladder with an area of chronic inflammation.  No malignancy.   CYSTOSCOPY WITH BIOPSY N/A 02/19/2018   Procedure: CYSTOSCOPY WITH BIOPSY/ FULGURATION / UROLIFT;  Surgeon: Festus Aloe, MD;  Location: [facility];  Service: Urology;  Laterality: N/A;   CYSTOSCOPY WITH FULGERATION N/A 07/30/2020   Bx benign. Procedure: CYSTOSCOPY WITH BLADDER BIOPSY/FULGERATION;  Surgeon: Festus Aloe, MD;  Location: [facility];  Service: Urology;  Laterality: N/A;   CYSTOSCOPY WITH  INSERTION OF UROLIFT  02/19/2018   LUMBAR LAMINECTOMY     Dr Vertell Limber performed 2nd & 3rd procedures (pt also has hx of fall and vertebral fracture years prior to his surgeries.   TONSILLECTOMY  as child    MEDS:  Outpatient Medications Prior to Visit  Medication Sig Dispense Refill   albuterol (VENTOLIN HFA) 108 (90 Base) MCG/ACT inhaler TAKE 2 PUFFS BY MOUTH EVERY 6 HOURS AS NEEDED FOR WHEEZE OR SHORTNESS OF BREATH 6.7 each 0   Ascorbic Acid (VITAMIN C) 1000 MG tablet Take 1,000 mg by mouth at bedtime.     aspirin EC 81 MG tablet Take 1 tablet (81 mg total) by mouth daily. Swallow whole. 30 tablet 11   atorvastatin (LIPITOR) 80 MG tablet Take 1 tablet (80 mg total) by mouth daily. 30 tablet 1   diclofenac (VOLTAREN) 75 MG EC tablet TAKE 1 TABLET BY MOUTH TWICE A DAY AS NEEDED FOR ARTHRITIS PAIN. 60 tablet 2   finasteride (PROSCAR) 5 MG tablet TAKE 1 TABLET BY MOUTH EVERYDAY AT BEDTIME 90 tablet 1   pantoprazole (PROTONIX) 40 MG tablet Take 1 tablet (40 mg total) by mouth daily. 90 tablet 1   tamsulosin (FLOMAX) 0.4 MG CAPS capsule TAKE 1 CAPSULE BY MOUTH EVERY DAY 90 capsule 1   No facility-administered medications prior to visit.    Physical Exam    04/25/2022   11:16 AM 04/22/2022   11:39 AM 04/22/2022    7:45 AM  Vitals with BMI  Height '5\' 7"'$     Weight 266 lbs 3 oz    BMI 86.76    Systolic 195 093 267  Diastolic 96 72 86  Pulse 99 77 68    Gen: Alert, well appearing.  Patient is oriented to person, place, time, and situation. AFFECT: pleasant, lucid thought and speech. Neuro: CN 2-12 intact bilaterally, strength 5/5 in proximal and distal upper extremities and lower extremities bilaterally.  No sensory deficits.  No tremor.   No ataxia.  Upper extremity and lower extremity DTRs symmetric.  No pronator drift.  Pertinent labs/imaging Last CBC Lab Results  Component Value Date   WBC 8.5 04/22/2022   HGB 15.6 04/22/2022   HCT 46.8 04/22/2022   MCV 88.5 04/22/2022   MCH 29.5  04/22/2022   RDW 14.7 04/22/2022   PLT 188 12/45/8099   Last metabolic panel Lab Results  Component Value Date   GLUCOSE 101 (H) 04/22/2022   NA 139 04/22/2022   K 4.1 04/22/2022   CL 108 04/22/2022   CO2 23 04/22/2022   BUN 13 04/22/2022   CREATININE 1.01 04/22/2022   GFRNONAA >60 04/22/2022   CALCIUM 8.5 (L) 04/22/2022   PHOS 2.6 06/19/2012   PROT 7.2 04/21/2022   ALBUMIN 3.7 04/21/2022   BILITOT 0.8 04/21/2022   ALKPHOS 71 04/21/2022   AST 21 04/21/2022   ALT 21 04/21/2022   ANIONGAP 8 04/22/2022   Last lipids Lab Results  Component  Value Date   CHOL 118 04/22/2022   HDL 33 (L) 04/22/2022   LDLCALC 70 04/22/2022   LDLDIRECT 133.0 06/26/2016   TRIG 74 04/22/2022   CHOLHDL 3.6 04/22/2022   Last hemoglobin A1c Lab Results  Component Value Date   HGBA1C 5.6 04/22/2022   ASSESSMENT/PLAN:  #1 TIA. Discussed the need for tight blood pressure control, LDL maintenance less than 70, hemoglobin A1c less than 5.7%. Emphasized the need for him to take aspirin 81 mg a day, atorvastatin 80 mg a day, losartan 100 mg a day, and Toprol-XL 50 mg a day. Continue to monitor blood pressure every 1 to 2 days over the next month.  #2 hypertension, well controlled per home measurements. (Elevated in the emergency department recently but normalized in the hospital.  Elevated here today.) Continue losartan 100 mg a day and Toprol-XL 50 mg a day.  FOLLOW UP: 1 month  Signed:  Crissie Sickles, MD           04/25/2022

## 2022-04-26 ENCOUNTER — Telehealth: Payer: Self-pay

## 2022-04-26 NOTE — Telephone Encounter (Signed)
Pt repeated blood pressure after reporting earlier blood pressure, it was 144/80. Advised to keep checking blood pressure every 1-2 days and take Losartan '100mg'$  and Metoprolol XL '50mg'$  once daily. He was advised of signs to look for in case of elevation.

## 2022-04-26 NOTE — Telephone Encounter (Signed)
Patient was seen yesterday.  His blood pressure was elevated yesterday at his visit with Dr. Anitra Lauth.  Today his blood pressure reading was 168 / 96.  Please advise. [phone]

## 2022-04-30 ENCOUNTER — Emergency Department (HOSPITAL_COMMUNITY): Payer: Medicare Other

## 2022-04-30 ENCOUNTER — Emergency Department (HOSPITAL_COMMUNITY)
Admission: EM | Admit: 2022-04-30 | Discharge: 2022-04-30 | Disposition: A | Discharge status: Home / Self Care | Payer: Medicare Other | Attending: Emergency Medicine | Admitting: Emergency Medicine

## 2022-04-30 DIAGNOSIS — Z7982 Long term (current) use of aspirin: Secondary | ICD-10-CM | POA: Insufficient documentation

## 2022-04-30 DIAGNOSIS — R200 Anesthesia of skin: Secondary | ICD-10-CM

## 2022-04-30 DIAGNOSIS — I10 Essential (primary) hypertension: Secondary | ICD-10-CM

## 2022-04-30 DIAGNOSIS — Z79899 Other long term (current) drug therapy: Secondary | ICD-10-CM | POA: Diagnosis not present

## 2022-04-30 DIAGNOSIS — G459 Transient cerebral ischemic attack, unspecified: Secondary | ICD-10-CM | POA: Diagnosis not present

## 2022-04-30 DIAGNOSIS — R202 Paresthesia of skin: Secondary | ICD-10-CM | POA: Diagnosis not present

## 2022-04-30 LAB — CBC WITH DIFFERENTIAL/PLATELET
Abs Immature Granulocytes: 0.02 10*3/uL (ref 0.00–0.07)
Basophils Absolute: 0.1 10*3/uL (ref 0.0–0.1)
Basophils Relative: 1 %
Eosinophils Absolute: 0.1 10*3/uL (ref 0.0–0.5)
Eosinophils Relative: 2 %
HCT: 51.4 % (ref 39.0–52.0)
Hemoglobin: 17.5 g/dL — ABNORMAL HIGH (ref 13.0–17.0)
Immature Granulocytes: 0 %
Lymphocytes Relative: 34 %
Lymphs Abs: 2.7 10*3/uL (ref 0.7–4.0)
MCH: 30.1 pg (ref 26.0–34.0)
MCHC: 34 g/dL (ref 30.0–36.0)
MCV: 88.3 fL (ref 80.0–100.0)
Monocytes Absolute: 1.2 10*3/uL — ABNORMAL HIGH (ref 0.1–1.0)
Monocytes Relative: 15 %
Neutro Abs: 3.9 10*3/uL (ref 1.7–7.7)
Neutrophils Relative %: 48 %
Platelets: 226 10*3/uL (ref 150–400)
RBC: 5.82 MIL/uL — ABNORMAL HIGH (ref 4.22–5.81)
RDW: 14.6 % (ref 11.5–15.5)
WBC: 8.1 10*3/uL (ref 4.0–10.5)
nRBC: 0 % (ref 0.0–0.2)

## 2022-04-30 LAB — BASIC METABOLIC PANEL
Anion gap: 10 (ref 5–15)
BUN: 14 mg/dL (ref 8–23)
CO2: 24 mmol/L (ref 22–32)
Calcium: 8.7 mg/dL — ABNORMAL LOW (ref 8.9–10.3)
Chloride: 104 mmol/L (ref 98–111)
Creatinine, Ser: 0.92 mg/dL (ref 0.61–1.24)
GFR, Estimated: >60 mL/min (ref >60)
Glucose, Bld: 92 mg/dL (ref 70–99)
Potassium: 4.5 mmol/L (ref 3.5–5.1)
Sodium: 138 mmol/L (ref 135–145)

## 2022-04-30 MED ORDER — CLONIDINE HCL 0.1 MG PO TABS: 0.1000 mg | ORAL_TABLET | Freq: Every day | ORAL | 0 refills | Status: AC | PRN

## 2022-04-30 NOTE — ED Triage Notes (Signed)
Patient discharged last week after an admission for a TIA. Patient instructed at discharged to monitor his blood pressure. Patient states he has been measuring his blood pressure at home at meals times and the last four readings have each been progressively higher than the last. Patient is alert, oriented, and in no apparent distress at this time. Patient denies headache, denies dizziness, denies blurred vision. Patient has no complaints at this timer.

## 2022-04-30 NOTE — ED Provider Notes (Signed)
[facility] EMERGENCY DEPARTMENT Provider Note   CSN: [id number] Arrival date & time: 04/30/22  1105     History  Chief Complaint  Patient presents with   Hypertension    Derrick [name] is a 71 y.o. male.  Pt is a 71 yo male with pmhx significant for HTN, HLD, BPH, chronic osteomyelitis of the back, obesity, HOH, and recent TIA.  Pt was hospitalized from 12/1-12/2 with TIA sx.  His BP was elevated and it was thought his HTN called the TIA.  Pt said he has been doing well since he was d/c until today when sbp went up to 168.  He initially did not have any neurologic sx, but now has lip and tongue numbness.  Pt denies any vision disturbances or weakness in his arms or legs.  He is able to find his words appropriately.  He has been compliant with his meds.        Home Medications Prior to Admission medications   Medication Sig Start Date End Date Taking? Authorizing Provider  cloNIDine (CATAPRES) 0.1 MG tablet Take 1 tablet (0.1 mg total) by mouth daily as needed (Take once if systolic blood pressure over 170). 04/30/22  Yes Isla Pence, MD  albuterol (VENTOLIN HFA) 108 (90 Base) MCG/ACT inhaler TAKE 2 PUFFS BY MOUTH EVERY 6 HOURS AS NEEDED FOR WHEEZE OR SHORTNESS OF BREATH 01/12/22   McGowen, Adrian Blackwater, MD  Ascorbic Acid (VITAMIN C) 1000 MG tablet Take 1,000 mg by mouth at bedtime.    [provider]  aspirin EC 81 MG tablet Take 1 tablet (81 mg total) by mouth daily. Swallow whole. 04/22/22 04/22/23  Little Ishikawa, MD  atorvastatin (LIPITOR) 80 MG tablet Take 1 tablet (80 mg total) by mouth daily. 04/23/22   Little Ishikawa, MD  finasteride (PROSCAR) 5 MG tablet TAKE 1 TABLET BY MOUTH EVERYDAY AT BEDTIME 01/31/22   McGowen, Adrian Blackwater, MD  losartan (COZAAR) 100 MG tablet Take 1 tablet (100 mg total) by mouth daily. 04/25/22   McGowen, Adrian Blackwater, MD  metoprolol succinate (TOPROL-XL) 50 MG 24 hr tablet Take 1 tablet (50 mg total) by mouth daily. Take  with or immediately following a meal. 04/25/22   McGowen, Adrian Blackwater, MD  pantoprazole (PROTONIX) 40 MG tablet Take 1 tablet (40 mg total) by mouth daily. 01/31/22   McGowen, Adrian Blackwater, MD  tamsulosin (FLOMAX) 0.4 MG CAPS capsule TAKE 1 CAPSULE BY MOUTH EVERY DAY 10/28/21   McGowen, Adrian Blackwater, MD      Allergies    Patient has no known allergies.    Review of Systems   Review of Systems  Neurological:  Positive for numbness.  All other systems reviewed and are negative.   Physical Exam Updated Vital Signs BP 133/76   Pulse 87   Temp 97.6 F (36.4 C) (Oral)   Resp 20   SpO2 99%  Physical Exam Vitals and nursing note reviewed.  Constitutional:      Appearance: Normal appearance. He is obese.  HENT:     Head: Normocephalic and atraumatic.     Right Ear: External ear normal.     Left Ear: External ear normal.     Nose: Nose normal.     Mouth/Throat:     Mouth: Mucous membranes are moist.     Pharynx: Oropharynx is clear.  Eyes:     Extraocular Movements: Extraocular movements intact.     Conjunctiva/sclera: Conjunctivae normal.     Pupils:  Pupils are equal, round, and reactive to light.  Cardiovascular:     Rate and Rhythm: Normal rate and regular rhythm.     Pulses: Normal pulses.     Heart sounds: Normal heart sounds.  Pulmonary:     Effort: Pulmonary effort is normal.     Breath sounds: Normal breath sounds.  Abdominal:     General: Abdomen is flat. Bowel sounds are normal.     Palpations: Abdomen is soft.  Musculoskeletal:        General: Normal range of motion.     Cervical back: Normal range of motion and neck supple.  Skin:    General: Skin is warm.     Capillary Refill: Capillary refill takes less than 2 seconds.  Neurological:     General: No focal deficit present.     Mental Status: He is alert and oriented to person, place, and time.  Psychiatric:        Mood and Affect: Mood normal.        Behavior: Behavior normal.     ED Results / Procedures /  Treatments   Labs (all labs ordered are listed, but only abnormal results are displayed) Labs Reviewed  CBC WITH DIFFERENTIAL/PLATELET - Abnormal; Notable for the following components:      Result Value   RBC 5.82 (*)    Hemoglobin 17.5 (*)    Monocytes Absolute 1.2 (*)    All other components within normal limits  BASIC METABOLIC PANEL - Abnormal; Notable for the following components:   Calcium 8.7 (*)    All other components within normal limits    EKG EKG Interpretation  Date/Time:  Sunday April 30 2022 13:27:51 EST Ventricular Rate:  85 PR Interval:  151 QRS Duration: 100 QT Interval:  397 QTC Calculation: 473 R Axis:   152 Text Interpretation: Sinus rhythm Left posterior fascicular block Low voltage, precordial leads Abnormal R-wave progression, late transition No significant change since last tracing Confirmed by Isla Pence [phone]) on 04/30/2022 1:52:20 PM  Radiology MR BRAIN WO CONTRAST  Result Date: 04/30/2022 CLINICAL DATA:  TIA. EXAM: MRI HEAD WITHOUT CONTRAST TECHNIQUE: Multiplanar, multiecho pulse sequences of the brain and surrounding structures were obtained without intravenous contrast. COMPARISON:  CT Head 04/21/22, MRI Brain 04/22/22 FINDINGS: Brain: No acute infarction, hemorrhage, hydrocephalus, extra-axial collection or mass lesion. Findings of severe chronic microvascular ischemic change, unchanged from prior exam. Cavum septum pellucidum. Vascular: Normal flow voids. Skull and upper cervical spine: Normal marrow signal. Sinuses/Orbits: Redemonstrated extensive OMC pattern polypoid mucosal thickening in the right frontal, ethmoid, and maxillary sinuses. Other: None. IMPRESSION: 1. No acute intracranial process. 2. Stable severe chronic microvascular ischemic change. 3. Redemonstrated extensive OMC pattern polypoid mucosal thickening in the right frontal, ethmoid, and maxillary sinuses. Electronically Signed   By: Marin Roberts M.D.   On: 04/30/2022 14:31     Procedures Procedures    Medications Ordered in ED Medications - No data to display  ED Course/ Medical Decision Making/ A&P                           Medical Decision Making Amount and/or Complexity of Data Reviewed Radiology: ordered.  Risk Prescription drug management.   This patient presents to the ED for concern of numbness and htn, this involves an extensive number of treatment options, and is a complaint that carries with it a high risk of complications and morbidity.  The differential diagnosis includes cva,  tia, electrolyte abn   Co morbidities that complicate the patient evaluation  HTN, HLD, BPH, chronic osteomyelitis of the back, obesity, HOH, and recent TIA   Additional history obtained:  Additional history obtained from epic chart review External records from outside source obtained and reviewed including wife   Lab Tests:  I Ordered, and personally interpreted labs.  The pertinent results include:  cbc nl, bmp nl   Imaging Studies ordered:  I ordered imaging studies including MRI  I independently visualized and interpreted imaging which showed   No acute intracranial process.  2. Stable severe chronic microvascular ischemic change.  3. Redemonstrated extensive OMC pattern polypoid mucosal thickening  in the right frontal, ethmoid, and maxillary sinuses.   I agree with the radiologist interpretation   Cardiac Monitoring:  The patient was maintained on a cardiac monitor.  I personally viewed and interpreted the cardiac monitored which showed an underlying rhythm of: nsr   Medicines ordered and prescription drug management:   I have reviewed the patients home medicines and have made adjustments as needed   Test Considered:  MRI   Critical Interventions:  MRI   Consultations Obtained:  I requested consultation with the neurologist (Dr. Quinn Axe),  and discussed lab and imaging findings as well as pertinent plan - she recommends d/c  with outpatient f/u   Problem List / ED Course:  TIA sx:  MRI neg.  BP back to nl.  Pt given a rx for clonidine to take if sbp goes above 170.  Pt is to return if worse.  F/u with pcp.   Reevaluation:  After the interventions noted above, I reevaluated the patient and found that they have :improved   Social Determinants of Health:  Lives at home   Dispostion:  After consideration of the diagnostic results and the patients response to treatment, I feel that the patent would benefit from discharge with outpatient f/u.          Final Clinical Impression(s) / ED Diagnoses Final diagnoses:  Hypertension, unspecified type  Numbness    Rx / DC Orders ED Discharge Orders          Ordered    cloNIDine (CATAPRES) 0.1 MG tablet  Daily PRN        04/30/22 1512              Isla Pence, MD 04/30/22 1524

## 2022-04-30 NOTE — ED Provider Triage Note (Signed)
Emergency Medicine Provider Triage Evaluation Note  Derrick [name] , a 71 y.o. male  was evaluated in triage.  Pt complains of elevated blood pressure.  Patient recently had TIA and is concerned about elevated blood pressure.  Blood pressure uptrending.  Recently increased ASA to 325.  Patient is currently asymptomatic.  Review of Systems  Positive: Elevated BP Negative: CP  Physical Exam  BP (!) 132/90 (BP Location: Left Arm)   Pulse (!) 104   Temp (!) 97.5 F (36.4 C) (Oral)   Resp 18   SpO2 95%  Gen:   Awake, no distress   Resp:  Normal effort  MSK:   Moves extremities without difficulty  Other:    Medical Decision Making  Medically screening exam initiated at 11:30 AM.  Appropriate orders placed.  TYRA MICHELLE was informed that the remainder of the evaluation will be completed by another provider, this initial triage assessment does not replace that evaluation, and the importance of remaining in the ED until their evaluation is complete.  Routine labs   Karie Kirks 04/30/22 1131

## 2022-04-30 NOTE — ED Notes (Signed)
Patient transported to MRI 

## 2022-05-06 ENCOUNTER — Other Ambulatory Visit: Payer: Self-pay | Admitting: Family Medicine

## 2022-05-08 ENCOUNTER — Other Ambulatory Visit: Payer: Self-pay

## 2022-05-08 ENCOUNTER — Telehealth: Payer: Self-pay

## 2022-05-08 NOTE — Telephone Encounter (Signed)
Per Chart pt was to continue Losartan. Please advise.

## 2022-05-08 NOTE — Telephone Encounter (Signed)
Pt informed

## 2022-05-08 NOTE — Telephone Encounter (Signed)
Has questions on BP meds and what he should be taking.  Patient states that Dr. Anitra Lauth took him off of Losartan. Blood pressure has been 144/90.  I scheduled a follow up for 12/26 with Dr. Anitra Lauth

## 2022-05-08 NOTE — Telephone Encounter (Signed)
I did NOT take him off any of his blood pressure medications. He should definitely be taking losartan 100 mg daily and toprol xl 50 mg daily.

## 2022-05-16 ENCOUNTER — Ambulatory Visit (INDEPENDENT_AMBULATORY_CARE_PROVIDER_SITE_OTHER): Payer: Medicare Other | Admitting: Family Medicine

## 2022-05-16 ENCOUNTER — Ambulatory Visit: Payer: Medicare Other | Attending: Family Medicine

## 2022-05-16 ENCOUNTER — Encounter: Payer: Self-pay | Admitting: Family Medicine

## 2022-05-16 VITALS — BP 117/70 | HR 75 | Temp 97.3°F | Ht 67.0 in | Wt 261.2 lb

## 2022-05-16 DIAGNOSIS — G459 Transient cerebral ischemic attack, unspecified: Secondary | ICD-10-CM

## 2022-05-16 DIAGNOSIS — I639 Cerebral infarction, unspecified: Secondary | ICD-10-CM

## 2022-05-16 DIAGNOSIS — I10 Essential (primary) hypertension: Secondary | ICD-10-CM | POA: Diagnosis not present

## 2022-05-16 MED ORDER — CLOPIDOGREL BISULFATE 75 MG PO TABS: 75.0000 mg | ORAL_TABLET | Freq: Every day | ORAL | 1 refills | Status: DC

## 2022-05-16 NOTE — Progress Notes (Unsigned)
Enrolled patient for a 7 day Zio XT monitor to be mailed to patients home   DOD to read 

## 2022-05-16 NOTE — Progress Notes (Signed)
OFFICE VISIT  05/16/2022  CC:  Chief Complaint  Patient presents with   Medical Management of Chronic Issues   Patient is a 71 y.o. male who presents for f/u HTN and HLD. I last saw him on 04/25/2022. A/P as of that visit: "#1 TIA. Discussed the need for tight blood pressure control, LDL maintenance less than 70, hemoglobin A1c less than 5.7%. Emphasized the need for him to take aspirin 81 mg a day, atorvastatin 80 mg a day, losartan 100 mg a day, and Toprol-XL 50 mg a day. Continue to monitor blood pressure every 1 to 2 days over the next month.   #2 hypertension, well controlled per home measurements. (Elevated in the emergency department recently but normalized in the hospital.  Elevated here today.) Continue losartan 100 mg a day and Toprol-XL 50 mg a day."  INTERIM HX: Home blood pressures around 130/80 average. He is currently taking blood pressure medications correctly: Losartan 100 mg a day and Toprol-XL 50 mg a day.  He feels well. However, 3 days ago he felt acute onset of numbness in the left hand and the left side of his lips.  This lasted about 10 minutes.  He did not feel any weakness and had no speech abnormalities or cognitive changes.  No dizziness or headaches.  His blood pressure was normal at home at that time. Denies palpitations.  Review of systems: No chest pain, no shortness of breath, no abdominal pain, no nausea or vomiting, no diaphoresis, no lower extremity swelling or pain.  No focal weakness, no acute hearing or vision changes. No tremor, no ataxia.  Past Medical History:  Diagnosis Date   BPH with obstruction/lower urinary tract symptoms    +Hx of acute urinary retention/acute cystitis: responding fairly well to tamsulosin and finasteride as of 09/2016 urol f/u.  Urolift and bladd bx 02/2018-->doing great as of 07/2019 urol f/u.   Deafness in right ear    Erythrocytosis    stable dating back to 2009 (17-18 range). Iron normal. No s/s of OSA.  Epo  normal.  Just following as long as stable as of 10/2020 (no hem/onc ref)   Gross hematuria    hematuria protocol w/u being done by urol as of their 01/23/18 note.   History of back injury    Back fracture fell from tree   History of erectile dysfunction    History of fracture of right ankle 05/2015   History of kidney stones    History of staph infection 10 yrs ago   after back surgery   HTN (hypertension)    Hyperlipidemia    Low back pain    Microhematuria    chronic; pt gets periodic cystoscopy for monitoring, most recent 07/30/20-->bx showed benign urothelium and submucosa, with eosinophilic cystitis.   Nephrolithiasis 2015   CT showed 2cm left sided stone which was stable since 2006 imaging and likely in renal parenchyma.  2.5 cm as of 08/2017 neph f/u: renal u/s 12/2017-->no hydro. KUB showed stable L sided 18 mm stone. Cystoscopy 12/2017 erythematous mucosa at dome, no mass.    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  Annotation: previous methicillin sensitive  staphylococcus aureus 2006 Qualifier: Diagnosis of  By: Johnnye Sima MD, Dellis Filbert  (ID Specialist)     Prostate cancer screening    via Urol--> PSA 1.39 April 2019. 1.36 Feb 2020.   TIA (transient ischemic attack)    03/2022  Tinnitus, left ear     Past Surgical History:  Procedure Laterality Date   COLONOSCOPY  10/12/2015   repeat 5 years   COLONOSCOPY W/ POLYPECTOMY  2007   Negative 2012 and 2017; Dr Ollen Barges 2022.   CYSTOSCOPY     with bladder biopsy:  dome of bladder with an area of chronic inflammation.  No malignancy.   CYSTOSCOPY WITH BIOPSY N/A 02/19/2018   Procedure: CYSTOSCOPY WITH BIOPSY/ FULGURATION / UROLIFT;  Surgeon: Festus Aloe, MD;  Location: [facility];  Service: Urology;  Laterality: N/A;   CYSTOSCOPY WITH FULGERATION N/A 07/30/2020   Bx benign. Procedure: CYSTOSCOPY WITH BLADDER BIOPSY/FULGERATION;  Surgeon: Festus Aloe, MD;  Location: [facility];  Service: Urology;  Laterality: N/A;   CYSTOSCOPY WITH INSERTION OF UROLIFT  02/19/2018   LUMBAR LAMINECTOMY     Dr Vertell Limber performed 2nd & 3rd procedures (pt also has hx of fall and vertebral fracture years prior to his surgeries.   TONSILLECTOMY  as child   TRANSTHORACIC ECHOCARDIOGRAM     03/2022 EF normal, grd I DD, mild AI, mod Pulm insuff    Outpatient Medications Prior to Visit  Medication Sig Dispense Refill   albuterol (VENTOLIN HFA) 108 (90 Base) MCG/ACT inhaler TAKE 2 PUFFS BY MOUTH EVERY 6 HOURS AS NEEDED FOR WHEEZE OR SHORTNESS OF BREATH 6.7 each 0   Ascorbic Acid (VITAMIN C) 1000 MG tablet Take 1,000 mg by mouth at bedtime.     atorvastatin (LIPITOR) 80 MG tablet Take 1 tablet (80 mg total) by mouth daily. 30 tablet 1   cloNIDine (CATAPRES) 0.1 MG tablet Take 1 tablet (0.1 mg total) by mouth daily as needed (Take once if systolic blood pressure over 170). 30 tablet 0   finasteride (PROSCAR) 5 MG tablet TAKE 1 TABLET BY MOUTH EVERYDAY AT BEDTIME 90 tablet 1   losartan (COZAAR) 100 MG tablet TAKE 1 TABLET BY MOUTH EVERY DAY 90 tablet 3   metoprolol succinate (TOPROL-XL) 50 MG 24 hr tablet Take 1 tablet (50 mg total) by mouth daily. Take with or immediately following a meal. 90 tablet 3   pantoprazole (PROTONIX) 40 MG tablet Take 1 tablet (40 mg total) by mouth daily. 90 tablet 1   tamsulosin (FLOMAX) 0.4 MG CAPS capsule TAKE 1 CAPSULE BY MOUTH EVERY DAY 90 capsule 1   aspirin EC 81 MG tablet Take 1 tablet (81 mg total) by mouth daily. Swallow whole. 30 tablet 11   No facility-administered medications prior to visit.    No Known Allergies  Review of Systems As per HPI  PE:    05/16/2022   10:22 AM 05/16/2022   10:15 AM 04/30/2022    3:15 PM  Vitals with BMI  Height  '5\' 7"'$    Weight  261 lbs 3 oz   BMI  46.9   Systolic [phone]  Diastolic 70 67 72  Pulse 75 76 90   Physical Exam  Gen: Alert, well appearing.   Patient is oriented to person, place, time, and situation. AFFECT: pleasant, lucid thought and speech. CV: RRR, no m/r/g.   LUNGS: CTA bilat, nonlabored resps, good aeration in all lung fields.   LABS:  Last CBC Lab Results  Component Value Date   WBC 8.1 04/30/2022   HGB 17.5 (H) 04/30/2022   HCT 51.4 04/30/2022   MCV 88.3 04/30/2022   MCH 30.1 04/30/2022   RDW 14.6 04/30/2022   PLT 226 24/40/1027   Last metabolic panel Lab Results  Component Value Date   GLUCOSE 92 04/30/2022   NA 138 04/30/2022   K 4.5 04/30/2022   CL 104 04/30/2022   CO2 24 04/30/2022   BUN 14 04/30/2022   CREATININE 0.92 04/30/2022   GFRNONAA >60 04/30/2022   CALCIUM 8.7 (L) 04/30/2022   PHOS 2.6 06/19/2012   PROT 7.2 04/21/2022   ALBUMIN 3.7 04/21/2022   BILITOT 0.8 04/21/2022   ALKPHOS 71 04/21/2022   AST 21 04/21/2022   ALT 21 04/21/2022   ANIONGAP 10 04/30/2022   Last lipids Lab Results  Component Value Date   CHOL 118 04/22/2022   HDL 33 (L) 04/22/2022   LDLCALC 70 04/22/2022   LDLDIRECT 133.0 06/26/2016   TRIG 74 04/22/2022   CHOLHDL 3.6 04/22/2022   Last hemoglobin A1c Lab Results  Component Value Date   HGBA1C 5.6 04/22/2022     Lab Results  Component Value Date   PSA 1.36 07/04/2018   PSA 1.36 07/04/2018   PSA 1.95 06/21/2011   IMPRESSION AND PLAN:  1 hypertension, well-controlled on losartan 100 mg a day and Toprol-XL 50 mg a day.  2.  TIA on 04/21/2022. He had a brief recurrence of milder symptoms 3 days ago. Evaluation when in the hospital for his TIA was suggestive of small vessel cerebrovascular disease. He went home on aspirin and statin. Will discontinue aspirin and start Plavix 75 mg a day. Continue statin.  Blood pressure control is good. Hemoglobin A1c was normal earlier this month. Will complete workup with outpatient rhythm monitoring--ordered today. Refer to neurology as well.  An After Visit Summary was printed and given to the patient.  FOLLOW  UP: Return in about 4 weeks (around 06/13/2022) for f/u tia/bp.  Signed:  Crissie Sickles, MD           05/16/2022

## 2022-05-16 NOTE — Pre-Procedure Instructions (Signed)
Surgical Instructions    Your procedure is scheduled on Wednesday, January 3rd.  Report to [facility] Main Entrance "A" at 10:00 A.M., then check in with the Admitting office.  Call this number if you have problems the morning of surgery:  [phone]   If you have any questions prior to your surgery date call [phone]: Open Monday-Friday 8am-4pm    Remember:  Do not eat after midnight the night before your surgery  You may drink clear liquids until 09:00 AM the morning of your surgery.   Clear liquids allowed are: Water, Non-Citrus Juices (without pulp), Carbonated Beverages, Clear Tea, Black Coffee Only (NO MILK, CREAM OR POWDERED CREAMER of any kind), and Gatorade.    Take these medicines the morning of surgery with A SIP OF WATER  atorvastatin (LIPITOR)  metoprolol succinate (TOPROL-XL)  pantoprazole (PROTONIX)  tamsulosin (FLOMAX)    If needed: albuterol (VENTOLIN HFA)- if needed, bring with you on day of surgery  cloNIDine (CATAPRES)     Follow your surgeon's instructions on when to stop Plavix.  If no instructions were given by your surgeon then you will need to call the office to get those instructions.     As of today, STOP taking any Aspirin (unless otherwise instructed by your surgeon) Aleve, Naproxen, Ibuprofen, Motrin, Advil, Goody's, BC's, all herbal medications, fish oil, and all vitamins.                     Do NOT Smoke (Tobacco/Vaping) for 24 hours prior to your procedure.  If you use a CPAP at night, you may bring your mask/headgear for your overnight stay.   Contacts, glasses, piercing's, hearing aid's, dentures or partials may not be worn into surgery, please bring cases for these belongings.    For patients admitted to the hospital, discharge time will be determined by your treatment team.   Patients discharged the day of surgery will not be allowed to drive home, and someone needs to stay with them for 24 hours.  SURGICAL WAITING ROOM  VISITATION Patients having surgery or a procedure may have no more than 2 support people in the waiting area - these visitors may rotate.   Children under the age of 46 must have an adult with them who is not the patient. If the patient needs to stay at the hospital during part of their recovery, the visitor guidelines for inpatient rooms apply. Pre-op nurse will coordinate an appropriate time for 1 support person to accompany patient in pre-op.  This support person may not rotate.   Please refer to the [facility] website for the visitor guidelines for Inpatients (after your surgery is over and you are in a regular room).    Special instructions:   [facility]- Preparing For Surgery  Before surgery, you can play an important role. Because skin is not sterile, your skin needs to be as free of germs as possible. You can reduce the number of germs on your skin by washing with CHG (chlorahexidine gluconate) Soap before surgery.  CHG is an antiseptic cleaner which kills germs and bonds with the skin to continue killing germs even after washing.    Oral Hygiene is also important to reduce your risk of infection.  Remember - BRUSH YOUR TEETH THE MORNING OF SURGERY WITH YOUR REGULAR TOOTHPASTE  Please do not use if you have an allergy to CHG or antibacterial soaps. If your skin becomes reddened/irritated stop using the CHG.  Do not shave (including legs  and underarms) for at least 48 hours prior to first CHG shower. It is OK to shave your face.  Please follow these instructions carefully.   Shower the NIGHT BEFORE SURGERY and the MORNING OF SURGERY  If you chose to wash your hair, wash your hair first as usual with your normal shampoo.  After you shampoo, rinse your hair and body thoroughly to remove the shampoo.  Use CHG Soap as you would any other liquid soap. You can apply CHG directly to the skin and wash gently with a scrungie or a clean washcloth.   Apply the CHG Soap to your body ONLY  FROM THE NECK DOWN.  Do not use on open wounds or open sores. Avoid contact with your eyes, ears, mouth and genitals (private parts). Wash Face and genitals (private parts)  with your normal soap.   Wash thoroughly, paying special attention to the area where your surgery will be performed.  Thoroughly rinse your body with warm water from the neck down.  DO NOT shower/wash with your normal soap after using and rinsing off the CHG Soap.  Pat yourself dry with a CLEAN TOWEL.  Wear CLEAN PAJAMAS to bed the night before surgery  Place CLEAN SHEETS on your bed the night before your surgery  DO NOT SLEEP WITH PETS.   Day of Surgery: Take a shower with CHG soap. Do not wear jewelry  Do not wear lotions, powders, colognes, or deodorant. Men may shave face and neck. Do not bring valuables to the hospital. [facility] is not responsible for any belongings or valuables.  Wear Clean/Comfortable clothing the morning of surgery Remember to brush your teeth WITH YOUR REGULAR TOOTHPASTE.   Please read over the following fact sheets that you were given.    If you received a COVID test during your pre-op visit  it is requested that you wear a mask when out in public, stay away from anyone that may not be feeling well and notify your surgeon if you develop symptoms. If you have been in contact with anyone that has tested positive in the last 10 days please notify you surgeon.

## 2022-05-16 NOTE — Patient Instructions (Addendum)
Stop aspirin. Start clopidogrel-->I sent this to your pharmacy today.

## 2022-05-17 ENCOUNTER — Other Ambulatory Visit: Payer: Self-pay

## 2022-05-17 ENCOUNTER — Encounter (HOSPITAL_COMMUNITY)
Admission: RE | Admit: 2022-05-17 | Discharge: 2022-05-17 | Disposition: A | Discharge status: Home / Self Care | Payer: Medicare Other | Source: Ambulatory Visit | Attending: Otolaryngology | Admitting: Otolaryngology

## 2022-05-17 ENCOUNTER — Encounter (HOSPITAL_COMMUNITY): Payer: Self-pay

## 2022-05-17 ENCOUNTER — Encounter (HOSPITAL_COMMUNITY): Payer: Self-pay | Admitting: Vascular Surgery

## 2022-05-17 DIAGNOSIS — J324 Chronic pansinusitis: Secondary | ICD-10-CM | POA: Insufficient documentation

## 2022-05-17 DIAGNOSIS — I10 Essential (primary) hypertension: Secondary | ICD-10-CM | POA: Insufficient documentation

## 2022-05-17 DIAGNOSIS — J342 Deviated nasal septum: Secondary | ICD-10-CM | POA: Diagnosis not present

## 2022-05-17 DIAGNOSIS — Z8673 Personal history of transient ischemic attack (TIA), and cerebral infarction without residual deficits: Secondary | ICD-10-CM | POA: Diagnosis not present

## 2022-05-17 DIAGNOSIS — Z87891 Personal history of nicotine dependence: Secondary | ICD-10-CM | POA: Insufficient documentation

## 2022-05-17 DIAGNOSIS — E785 Hyperlipidemia, unspecified: Secondary | ICD-10-CM | POA: Diagnosis not present

## 2022-05-17 DIAGNOSIS — H9191 Unspecified hearing loss, right ear: Secondary | ICD-10-CM | POA: Insufficient documentation

## 2022-05-17 DIAGNOSIS — Z01812 Encounter for preprocedural laboratory examination: Secondary | ICD-10-CM | POA: Insufficient documentation

## 2022-05-17 NOTE — Progress Notes (Signed)
   05/17/22 1119  OBSTRUCTIVE SLEEP APNEA  Have you ever been diagnosed with sleep apnea through a sleep study? No  Do you snore loudly (loud enough to be heard through closed doors)?  0  Do you often feel tired, fatigued, or sleepy during the daytime (such as falling asleep during driving or talking to someone)? 0  Has anyone observed you stop breathing during your sleep? 0  Do you have, or are you being treated for high blood pressure? 1  BMI more than 35 kg/m2? 1  Age > 50 (1-yes) 1  Neck circumference greater than:Male 16 inches or larger, Male 17inches or larger? 1  Male Gender (Yes=1) 1  Obstructive Sleep Apnea Score 5

## 2022-05-17 NOTE — Progress Notes (Signed)
Anesthesia PAT Evaluation:  Case: [id number] Date/Time: 05/24/22 1146   Procedures:      SINUS ENDO WITH FUSION (Right)     NASAL SEPTOPLASTY WITH TURBINATE REDUCTION (Bilateral)   Anesthesia type: General   Pre-op diagnosis:      Chronic pansinusitis     Nasal septal deviation     Hypertrophy of inferior nasal turbinate     Nasal congestion with rhinorrhea   Location: [facility] OR ROOM 08 / [place] [facility]   Surgeons: Skotnicki, Meghan A, DO       DISCUSSION: Patient is a 71 year old male scheduled for the above procedure.  History includes former smoker (quit 05/22/73), HTN, HLD, chronic osteomyelitis following lumbar surgery (s/p prolonged antibiotics and removal of hardware 2008), right ear deafness, erythrocytosis, TIA (04/21/22), BPH. 04/22/22 echo showed mild AI and moderate PR. BMI is consistent with morbid obesity. OSA screening score ws 5.   [place] admission 04/21/22-04/22/22 for TIA. He had a neurology telemedicine consult with Amie Portland, MD on 04/21/22 while in [facility] ED for two transient episodes of left sided numbness and tingling. The first was that morning and lasted about 20 minutes. The second episode occurred later that day after visiting his mother-in-law in the hospital. He again had left sided N/T but also difficulty holding objects in his left hand and his wife felt his speech was slurred. Symptoms started improving en route to ED. BP 167/89. Head CT was negative for acute findings. He was transferred to [facility] for overnight admission and additional work-up including CTA head/neck, echocardiogram, brain MRI. CTA showed no emergent large vessel occlusion or high grade stenosis, but small area of ischemia that corresponded to superficial watershed pattern at the right ACA/MCA and MCA/PCA junctions. 04/22/22 echo showed LVEF 65-70%, no regional wall motion abnormalities, grade 1 diastolic dysfunction, normal RVSF, mild AI, moderate pulmonic valve regurgitation, no intracardiac source of embolism on  TTE. 04/22/22 Brain MRI showed no acute finding including infarct, chronic small vessel ischemia in the cerebral white matter, and active sinusitis with diffuse obstruction of right-sided air cells. He was discharged with diagnosis of hypertensive emergency, HLD, and TIA and instructed to follow-up with PCP in 1-2 weeks. Per his Discharge Instructions, he was to stop taking losartan 100 mg daily and metoprolol succinate 50 mg daily.   He had [place] ED visit 04/30/22 for HTN. His SBP was up to 168. He thought his lip and tongue felt numb. Brain MRI was negative for acute findings. ED provider consulted with neurologist Dr. Quinn Axe with plans for out-patient follow-up. Clonidine 0.1 mg daily PRN for SBP > 170 prescribed. The following week, he called PCP Dr. Anitra Lauth to clarify medications since losartan and metoprolol discontinued when he was in the hospital. Dr. Anitra Lauth had him resume. In the interim, he also switched from ASA 81 mg to 325 mg daily after discussion with a family friend who is a Hydrograph[employer]or.   He had follow-up with Dr. Anitra Lauth on 05/16/22. Given recent TIA, he was prescribed Plavix 75 mg daily, ordered a ZioXT long term monitor, and referred  to neurology. Derrick [name] did not discuss surgery plans with Dr. Anitra Lauth. The monitor equipment is being mailed to him. He is scheduled for neurology out-patient visit with Dr. Kai Levins at [facility] on 05/23/22 8:00 AM.   I talked with Derrick [name] and his wife during his PAT visit. I discussed that with recent TIA, his surgery would likely have to be postponed for up  to 9 months depending on neurology recommendations--as he is at increased risk for perioperative CVA given recent events. Holding Plavix and/or ASA so soon after his TIA would also likely further increase his risk. Anesthesiologist Neoma Laming, MD is in agreement. I also reached out to Dr. Anitra Lauth regarding surgery plans who agrees that surgery should be postponed. He also  clarified that he recommended that Derrick [name] take Plavix 75 mg daily and ASA 81 mg daily instead of ASA 325 mg daily. I have notified Derrick [name] of this. I advised that when he is rescheduled for sinus surgery, that he will need neurology input regarding timing, as well as preoperative ASA and Plavix instructions if he is still remains on them at that time. I spoke with Angela Nevin at Dr. Dorathy Kinsman office and left a detailed message for her scheduler Angie about this. Derrick [name] says he also plans to follow-up with Dr. Fredric Dine.    VS: BP 121/71   Pulse 76   Temp (!) 36.3 C   Resp 18   Ht '5\' 7"'$  (1.702 m)   Wt 119.3 kg   SpO2 98%   BMI 41.19 kg/m    PROVIDERS: McGowen, Adrian Blackwater, MD is PCP  Festus Aloe, MD is urologist   LABS: Most recent lab results in [facility] include: Lab Results  Component Value Date   WBC 8.1 04/30/2022   HGB 17.5 (H) 04/30/2022   HCT 51.4 04/30/2022   PLT 226 04/30/2022   GLUCOSE 92 04/30/2022   CHOL 118 04/22/2022   TRIG 74 04/22/2022   HDL 33 (L) 04/22/2022   LDLCALC 70 04/22/2022   ALT 21 04/21/2022   AST 21 04/21/2022   NA 138 04/30/2022   K 4.5 04/30/2022   CL 104 04/30/2022   CREATININE 0.92 04/30/2022   BUN 14 04/30/2022   CO2 24 04/30/2022   INR 1.0 04/21/2022   HGBA1C 5.6 04/22/2022    IMAGES: MRI Brain 04/30/22: IMPRESSION: 1. No acute intracranial process. 2. Stable severe chronic microvascular ischemic change. 3. Redemonstrated extensive OMC pattern polypoid mucosal thickening in the right frontal, ethmoid, and maxillary sinuses.  CT Brain/CTA head/neck 04/21/22: IMPRESSION: 1. No emergent large vessel occlusion or high-grade stenosis of the intracranial or cervical arteries. 2. Small area of ischemia corresponds to a superficial watershed pattern at the right ACA/MCA and MCA/PCA junctions.   EKG: 04/30/22: Sinus rhythm Left posterior fascicular block Low voltage, precordial leads Abnormal R-wave progression, late  transition No significant change since last tracing Confirmed by Isla Pence [phone]) on 04/30/2022 1:52:20 PM   CV: Long term monitor 05/16/22: Pending.  Echo 04/22/22: IMPRESSIONS   1. Left ventricular ejection fraction, by estimation, is 65 to 70%. The  left ventricle has normal function. The left ventricle has no regional  wall motion abnormalities. Left ventricular diastolic parameters are  consistent with Grade I diastolic  dysfunction (impaired relaxation).   2. Right ventricular systolic function is normal. The right ventricular  size is normal.   3. The mitral valve is normal in structure. No evidence of mitral valve  regurgitation. No evidence of mitral stenosis.   4. The aortic valve is normal in structure. Aortic valve regurgitation is  mild. No aortic stenosis is present.   5. Pulmonic valve regurgitation is moderate.   6. The inferior vena cava is normal in size with greater than 50%  respiratory variability, suggesting right atrial pressure of 3 mmHg.  - Conclusion(s)/Recommendation(s): No intracardiac source of embolism  detected on this transthoracic study.  Consider a transesophageal  echocardiogram to exclude cardiac source of embolism if clinically  indicated.   Past Medical History:  Diagnosis Date   BPH with obstruction/lower urinary tract symptoms    +Hx of acute urinary retention/acute cystitis: responding fairly well to tamsulosin and finasteride as of 09/2016 urol f/u.  Urolift and bladd bx 02/2018-->doing great as of 07/2019 urol f/u.   Deafness in right ear    Erythrocytosis    stable dating back to 2009 (17-18 range). Iron normal. No s/s of OSA.  Epo normal.  Just following as long as stable as of 10/2020 (no hem/onc ref)   Gross hematuria    hematuria protocol w/u being done by urol as of their 01/23/18 note.   History of back injury    Back fracture fell from tree   History of erectile dysfunction    History of fracture of right ankle 05/2015    History of kidney stones    History of staph infection 10 yrs ago   after back surgery   HTN (hypertension)    Hyperlipidemia    Low back pain    Microhematuria    chronic; pt gets periodic cystoscopy for monitoring, most recent 07/30/20-->bx showed benign urothelium and submucosa, with eosinophilic cystitis.   Nephrolithiasis 2015   CT showed 2cm left sided stone which was stable since 2006 imaging and likely in renal parenchyma.  2.5 cm as of 08/2017 neph f/u: renal u/s 12/2017-->no hydro. KUB showed stable L sided 18 mm stone. Cystoscopy 12/2017 erythematous mucosa at dome, no mass.    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  Annotation: previous methicillin sensitive  staphylococcus aureus 2006 Qualifier: Diagnosis of  By: Johnnye Sima MD, Dellis Filbert  (ID Specialist)     Prostate cancer screening    via Urol--> PSA 1.39 April 2019. 1.36 Feb 2020.   TIA (transient ischemic attack)    03/2022   Tinnitus, left ear     Past Surgical History:  Procedure Laterality Date   COLONOSCOPY  10/12/2015   repeat 5 years   COLONOSCOPY W/ POLYPECTOMY  2007   Negative 2012 and 2017; Dr Ollen Barges 2022.   CYSTOSCOPY     with bladder biopsy:  dome of bladder with an area of chronic inflammation.  No malignancy.   CYSTOSCOPY WITH BIOPSY N/A 02/19/2018   Procedure: CYSTOSCOPY WITH BIOPSY/ FULGURATION / UROLIFT;  Surgeon: Festus Aloe, MD;  Location: [facility];  Service: Urology;  Laterality: N/A;   CYSTOSCOPY WITH FULGERATION N/A 07/30/2020   Bx benign. Procedure: CYSTOSCOPY WITH BLADDER BIOPSY/FULGERATION;  Surgeon: Festus Aloe, MD;  Location: [facility];  Service: Urology;  Laterality: N/A;   CYSTOSCOPY WITH INSERTION OF UROLIFT  02/19/2018   LUMBAR LAMINECTOMY     Dr Vertell Limber performed 2nd & 3rd procedures (pt also has hx of fall and vertebral fracture years prior to his surgeries.   TONSILLECTOMY  as  child   TRANSTHORACIC ECHOCARDIOGRAM     03/2022 EF normal, grd I DD, mild AI, mod Pulm insuff    MEDICATIONS:  albuterol (VENTOLIN HFA) 108 (90 Base) MCG/ACT inhaler   Ascorbic Acid (VITAMIN C) 1000 MG tablet   atorvastatin (LIPITOR) 80 MG tablet   cloNIDine (CATAPRES) 0.1 MG tablet   clopidogrel (PLAVIX) 75 MG tablet   finasteride (PROSCAR) 5 MG tablet   losartan (COZAAR) 100 MG tablet   metoprolol succinate (TOPROL-XL) 50 MG 24 hr tablet  pantoprazole (PROTONIX) 40 MG tablet   tamsulosin (FLOMAX) 0.4 MG CAPS capsule   No current facility-administered medications for this encounter.    Myra Gianotti, PA-C Surgical Short Stay/Anesthesiology [facility] Phone [phone] [facility] Phone [phone] 05/17/2022 5:01 PM

## 2022-05-17 NOTE — Progress Notes (Signed)
Initial neurology clinic note  [name] MRN: [id number] DOB: 04-05-1951  Referring provider: [name], MD  Primary care provider: [name], MD  Reason for consult:  TIA  Subjective:  This is Mr. Derrick [name], a 71 y.o. 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 The patient is alone today.  Patient woke on 04/21/22 with left toes, fingers, and cheek being numb. It resolved after 20 minutes. He was driving later that day and noticed left fingers getting numb. He tried to open a bottle and was unable. He later had his hand go numb again and dropped a water bowl for his dogs. He was speaking to his wife and noticed to have slurred speech. Per patient, the words he said didn't make sense and seemed liked the words were slow. Per patient, his mother in law had a stroke earlier that day and maybe there was additional stress evolved. He was sent to the ED at this point. He was seen by teleneurology and NIHSS was 1 for mildly slurred speech. CTH showed no acute process (aspects 10). Given his mild symptoms, decided against IV TNKase. CTA head and neck showed no LVO or high grade stenosis. CT perfusion showed an area of ischemia corresponding to the watershed area of the right ACA/MCA and MCA/PCA junctions. MRI brain on 04/22/22 showed no acute infarct but chronic small vessel disease. Echo on 04/22/22 showed normal EF, normal LA size, no evidence of shunt. BP was elevated at the time of presentation and felt to be a factor in patient's symptoms. Patient was started on asa 81 mg daily, atorvastatin 80 mg daily and discharged on 04/22/22.  On 04/30/22 patient again had elevated SBP to 168 and had recurrence of symptoms with lip and tongue numbness. Patient had another MRI brain that was similar to MRI on 04/22/22  with no acute infarct.  Around 05/13/22 patient had acute onset of left lip and left hand numbness that lasted about 10 minutes. Per patient, the first episode was very noticeable and the other two were minor and perhaps more concerning only because of the first episode. He has had no previous episodes since 05/13/22.  Patient is currently on asa 81 mg daily, plavix 75 mg daily, and atorvastatin 80 mg daily for secondary stroke prevention. He has been on plavix for about 1 week.  MEDICATIONS:  Outpatient Encounter Medications as of 05/23/2022  Medication Sig   Ascorbic Acid (VITAMIN C) 1000 MG tablet Take 1,000 mg by mouth at bedtime.   aspirin EC 81 MG tablet Take 81 mg by mouth daily. Swallow whole.   atorvastatin (LIPITOR) 80 MG tablet Take 1 tablet (80 mg total) by mouth daily.   cloNIDine (CATAPRES) 0.1 MG tablet Take 1 tablet (0.1 mg total) by mouth daily as needed (Take once if systolic blood pressure over 170).   finasteride (PROSCAR) 5 MG tablet TAKE 1 TABLET BY MOUTH EVERYDAY AT BEDTIME   losartan (COZAAR) 100 MG tablet TAKE 1 TABLET BY MOUTH EVERY DAY   metoprolol succinate (TOPROL-XL) 50 MG 24 hr tablet Take 1 tablet (50 mg total) by mouth daily. Take with or immediately following a meal.   tamsulosin (FLOMAX) 0.4 MG CAPS capsule TAKE 1 CAPSULE BY MOUTH EVERY DAY   albuterol (VENTOLIN HFA) 108 (90 Base) MCG/ACT inhaler TAKE 2 PUFFS BY  MOUTH EVERY 6 HOURS AS NEEDED FOR WHEEZE OR SHORTNESS OF BREATH   clopidogrel (PLAVIX) 75 MG tablet Take 1 tablet (75 mg total) by mouth daily.   pantoprazole (PROTONIX) 40 MG tablet Take 1 tablet (40 mg total) by mouth daily.   No facility-administered encounter medications on file as of 05/23/2022.    PAST MEDICAL HISTORY: Past Medical History:  Diagnosis Date   BPH with obstruction/lower urinary tract symptoms    +Hx of acute urinary retention/acute cystitis: responding fairly well to tamsulosin and finasteride as of 09/2016 urol f/u.  Urolift and  bladd bx 02/2018-->doing great as of 07/2019 urol f/u.   Deafness in right ear    Erythrocytosis    stable dating back to 2009 (17-18 range). Iron normal. No s/s of OSA.  Epo normal.  Just following as long as stable as of 10/2020 (no hem/onc ref)   Gross hematuria    hematuria protocol w/u being done by urol as of their 01/23/18 note.   History of back injury    Back fracture fell from tree   History of erectile dysfunction    History of fracture of right ankle 05/2015   History of kidney stones    History of staph infection 10 yrs ago   after back surgery   HTN (hypertension)    Hyperlipidemia    Low back pain    Microhematuria    chronic; pt gets periodic cystoscopy for monitoring, most recent 07/30/20-->bx showed benign urothelium and submucosa, with eosinophilic cystitis.   Nephrolithiasis 2015   CT showed 2cm left sided stone which was stable since 2006 imaging and likely in renal parenchyma.  2.5 cm as of 08/2017 neph f/u: renal u/s 12/2017-->no hydro. KUB showed stable L sided 18 mm stone. Cystoscopy 12/2017 erythematous mucosa at dome, no mass.    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  Annotation: previous methicillin sensitive  staphylococcus aureus 2006 Qualifier: Diagnosis of  By: Johnnye Sima MD, Dellis Filbert  (ID Specialist)     Prostate cancer screening    via Urol--> PSA 1.39 April 2019. 1.36 Feb 2020.   TIA (transient ischemic attack)    03/2022   Tinnitus, left ear     PAST SURGICAL HISTORY: Past Surgical History:  Procedure Laterality Date   COLONOSCOPY  10/12/2015   repeat 5 years   COLONOSCOPY W/ POLYPECTOMY  2007   Negative 2012 and 2017; Dr Ollen Barges 2022.   CYSTOSCOPY     with bladder biopsy:  dome of bladder with an area of chronic inflammation.  No malignancy.   CYSTOSCOPY WITH BIOPSY N/A 02/19/2018   Procedure: CYSTOSCOPY WITH BIOPSY/ FULGURATION / UROLIFT;  Surgeon: Festus Aloe, MD;  Location: [facility];  Service: Urology;  Laterality: N/A;   CYSTOSCOPY WITH FULGERATION N/A 07/30/2020   Bx benign. Procedure: CYSTOSCOPY WITH BLADDER BIOPSY/FULGERATION;  Surgeon: Festus Aloe, MD;  Location: [facility];  Service: Urology;  Laterality: N/A;   CYSTOSCOPY WITH INSERTION OF UROLIFT  02/19/2018   LUMBAR LAMINECTOMY     Dr Vertell Limber performed 2nd & 3rd procedures (pt also has hx of fall and vertebral fracture years prior to his surgeries.   TONSILLECTOMY  as child   TRANSTHORACIC ECHOCARDIOGRAM     03/2022 EF normal, grd I DD, mild AI, mod Pulm insuff    ALLERGIES: No Known Allergies  FAMILY HISTORY: Family History  Problem Relation Age of Onset  Stroke Mother    Cancer Mother 87       melanoma   Cancer Father 82       lung cancer; [employer] 2 (nonsmoker)   Heart disease Maternal Grandmother    Heart disease Maternal Grandfather    Heart disease Paternal Grandmother    Cancer Brother        bladder cancer    SOCIAL HISTORY: Social History   Tobacco Use   Smoking status: Former    Packs/day: 2.00    Years: 20.00    Total pack years: 40.00    Types: Cigarettes    Quit date: 05/22/1973    Years since quitting: 49.0   Smokeless tobacco: Current    Types: Chew   Tobacco comments:    35 years ago as of 2013  Vaping Use   Vaping Use: Never used  Substance Use Topics   Alcohol use: Not Currently   Drug use: No   Social History   Social History Narrative   Married, 1 son and 2 grandchildren.   Orig from [place] area, [employer].   Retired Electrical engineer (age 23).   [place]  Belongs to [employer].          Objective:  Vital Signs:  BP (!) 130/103   Pulse 90   Ht '5\' 7"'$  (1.702 m)   Wt 262 lb 3.2 oz (118.9 kg)   SpO2 95%   BMI 41.07 kg/m   General: No acute distress.  Patient appears well-groomed.   Head:  Normocephalic/atraumatic Neck: supple Heart: regular rate and rhythm Lungs: Clear to  auscultation bilaterally. Vascular: No carotid bruits.  Neurological Exam: Mental status: alert and oriented to person, place, and time, speech fluent and not dysarthric, language intact. Able to name and repeat.  Cranial nerves: CN I: not tested CN II: pupils equal, round and reactive to light, visual fields intact CN III, IV, VI:  full range of motion, no nystagmus, no ptosis CN V: facial sensation intact. CN VII: upper and lower face symmetric CN VIII: hearing intact CN IX, X: gag intact, uvula midline CN XI: sternocleidomastoid and trapezius muscles intact CN XII: tongue midline  Bulk & Tone: normal, no fasciculations. Motor:  muscle strength 5/5 throughout Deep Tendon Reflexes:  2+ throughout   Sensation:  Pinprick sensation intact. Finger to nose testing:  Without dysmetria.   Heel to shin:  Without dysmetria.   Gait:  Normal station and stride.  Romberg with mild sway.   Labs and Imaging review: Internal labs: Lab Results  Component Value Date   HGBA1C 5.6 04/22/2022   No results found for: "VITAMINB12" Lab Results  Component Value Date   TSH 1.40 07/05/2015   No results found for: "ESRSEDRATE", "POCTSEDRATE"  Lipid panel (04/22/22): Component     Latest Ref Rng 04/22/2022  Cholesterol     0 - 200 mg/dL 118   Triglycerides     <150 mg/dL 74   HDL Cholesterol     >40 mg/dL 33 (L)   Total CHOL/HDL Ratio     RATIO 3.6   VLDL     0 - 40 mg/dL 15   LDL (calc)     0 - 99 mg/dL 70      Imaging: CT head wo contrast (04/21/22): FINDINGS: Brain: There is no mass, hemorrhage or extra-axial collection. The size and configuration of the ventricles and extra-axial CSF spaces are normal. The brain parenchyma is normal, without evidence of acute or chronic  infarction.   Vascular: No abnormal hyperdensity of the major intracranial arteries or dural venous sinuses. No intracranial atherosclerosis.   Skull: The visualized skull base, calvarium and extracranial  soft tissues are normal.   Sinuses/Orbits: No fluid levels or advanced mucosal thickening of the visualized paranasal sinuses. No mastoid or middle ear effusion. The orbits are normal.   ASPECTS [facility] Stroke Program Early CT Score)   - Ganglionic level infarction (caudate, lentiform nuclei, internal capsule, insula, M1-M3 cortex): 7   - Supraganglionic infarction (M4-M6 cortex): 3   Total score (0-10 with 10 being normal): 10   IMPRESSION: 1. No acute intracranial abnormality. 2. ASPECTS is 10.  CTA head and neck/CTP (04/21/22): FINDINGS: CTA NECK FINDINGS   SKELETON: There is no bony spinal canal stenosis. No lytic or blastic lesion.   OTHER NECK: Normal pharynx, larynx and major salivary glands. No cervical lymphadenopathy. Unremarkable thyroid gland.   UPPER CHEST: No pneumothorax or pleural effusion. No nodules or masses.   AORTIC ARCH:   There is no calcific atherosclerosis of the aortic arch. There is no aneurysm, dissection or hemodynamically significant stenosis of the visualized portion of the aorta. Conventional 3 vessel aortic branching pattern. The visualized proximal subclavian arteries are widely patent.   RIGHT CAROTID SYSTEM: Normal without aneurysm, dissection or stenosis.   LEFT CAROTID SYSTEM: Normal without aneurysm, dissection or stenosis.   VERTEBRAL ARTERIES: Left dominant configuration. Both origins are clearly patent. There is no dissection, occlusion or flow-limiting stenosis to the skull base (V1-V3 segments).   CTA HEAD FINDINGS   POSTERIOR CIRCULATION:   --Vertebral arteries: Normal V4 segments.   --Inferior cerebellar arteries: Normal.   --Basilar artery: Normal.   --Superior cerebellar arteries: Normal.   --Posterior cerebral arteries (PCA): Normal.   ANTERIOR CIRCULATION:   --Intracranial internal carotid arteries: Normal.   --Anterior cerebral arteries (ACA): Normal. Both A1 segments are present. Patent anterior  communicating artery (a-comm).   --Middle cerebral arteries (MCA): Normal.   VENOUS SINUSES: As permitted by contrast timing, patent.   ANATOMIC VARIANTS: None   Review of the MIP images confirms the above findings.   Right-sided pansinusitis.   CT Brain Perfusion Findings:   ASPECTS: 10   CBF (<30%) Volume: 15m   Perfusion (Tmax>6.0s) volume: 150m  Mismatch Volume: 10100m Infarction Location:Area of ischemia corresponds to a superficial watershed pattern at the right ACA/MCA and MCA/PCA junctions.   IMPRESSION: 1. No emergent large vessel occlusion or high-grade stenosis of the intracranial or cervical arteries. 2. Small area of ischemia corresponds to a superficial watershed pattern at the right ACA/MCA and MCA/PCA junctions.  MRI brain wo contrast (04/22/22): FINDINGS: Brain: No acute infarction, hemorrhage, hydrocephalus, extra-axial collection or mass lesion. Mild chronic small vessel ischemia in the deep cerebral white matter and pons.   Vascular: Normal flow voids.   Skull and upper cervical spine: Normal marrow signal   Sinuses/Orbits: Opacification of right-sided maxillary, ethmoid, sphenoid, and frontal sinuses due to nasal cavity obstruction.   IMPRESSION: 1. No acute finding including infarct. 2. Chronic small vessel ischemia in the cerebral white matter. 3. Active sinusitis with diffuse obstruction of right-sided air cells, new from 2019.  MRI brain wo contrast (04/30/22): FINDINGS: Brain: No acute infarction, hemorrhage, hydrocephalus, extra-axial collection or mass lesion. Findings of severe chronic microvascular ischemic change, unchanged from prior exam. Cavum septum pellucidum.   Vascular: Normal flow voids.   Skull and upper cervical spine: Normal marrow signal.   Sinuses/Orbits: Redemonstrated extensive OMC pattern  polypoid mucosal thickening in the right frontal, ethmoid, and maxillary sinuses.   Other: None.   IMPRESSION: 1. No  acute intracranial process. 2. Stable severe chronic microvascular ischemic change. 3. Redemonstrated extensive OMC pattern polypoid mucosal thickening in the right frontal, ethmoid, and maxillary sinuses.  Echo (04/22/22): IMPRESSIONS   1. Left ventricular ejection fraction, by estimation, is 65 to 70%. The  left ventricle has normal function. The left ventricle has no regional  wall motion abnormalities. Left ventricular diastolic parameters are  consistent with Grade I diastolic dysfunction (impaired relaxation).   2. Right ventricular systolic function is normal. The right ventricular  size is normal.   3. The mitral valve is normal in structure. No evidence of mitral valve  regurgitation. No evidence of mitral stenosis.   4. The aortic valve is normal in structure. Aortic valve regurgitation is  mild. No aortic stenosis is present.   5. Pulmonic valve regurgitation is moderate.   6. The inferior vena cava is normal in size with greater than 50%  respiratory variability, suggesting right atrial pressure of 3 mmHg.   Conclusion(s)/Recommendation(s): No intracardiac source of embolism  detected on this transthoracic study. Consider a transesophageal  echocardiogram to exclude cardiac source of embolism if clinically  indicated.   Assessment/Plan:  BRECKAN CAFIERO is a 71 y.o. male who presents for evaluation of transient episodes of left sided numbness. 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 His neurological examination is essentially normal today. Available diagnostic data is significant for MRI brain x 2 showing no acute infarct but severe chronic microvascular ischemia. Vessel imaging with CTA head and neck did not show significant stenosis. I discussed the results of the MRI brain with patient and showed him the images. I explained that his chronic microvascular ischemic changes and  likely his TIA symptoms were due to long standing hypertension. I explained that good BP control would be important to reduce future stroke risk. Given how symptoms have been stereotyped, another possible explanation is seizure, though perhaps less likely. I discussed this with patient and symptoms to monitor for. I will continue DAPT for 3 total weeks, then only asa monotherapy thereafter.  PLAN: -Blood work: TSH -Continue asa 81 mg daily, plavix 75 mg daily for 3 total weeks (2 more weeks), then asa 81 mg monotherapy -Ziopatch - has 3 more days. Unlikely cardioembolic, but will follow up on results. -Continue atorvastatin 80 mg daily -Discussed stroke warning signs and to go to nearest ED if focal neurologic deficits appear -Patient to call if having more episodes or with any concerns/questions  -Return to clinic in 3 months  The impression above as well as the plan as outlined below were extensively discussed with the patient who voiced understanding. All questions were answered to their satisfaction.  When available, results of the above investigations and possible further recommendations will be communicated to the patient via telephone/MyChart. Patient to call office if not contacted after expected testing turnaround time.   Total time spent reviewing records, interview, history/exam, documentation, and coordination of care on day of encounter:  50 min   Thank you for allowing me to participate in patient's care.  If I can answer any additional questions, I would be pleased to do so.  Kai Levins, MD   CC: McGowen, Adrian Blackwater, MD [address] [place] [address]  CC: Referring provider: [name], MD  [address] [place],  [place] [address]

## 2022-05-17 NOTE — Progress Notes (Signed)
PCP - Dr. Julien Nordmann Cardiologist - denies  PPM/ICD - denies   Chest x-ray - 06/19/10 EKG - 04/30/22 Stress Test - 15 yrs ago per pt, ok per pt ECHO - 04/22/22 Cardiac Cath - denies  Sleep Study - denies   DM- denies   Blood Thinner Instructions: Aspirin Instructions: f/u with PCP about Plavix (pt has not started taking this yet)  ERAS Protcol - yes, no drink   COVID TEST- n/a   Anesthesia review: yes, pt states he recently had a TIA. Ebony Hail saw pt in PAT  Patient denies shortness of breath, fever, cough and chest pain at PAT appointment   All instructions explained to the patient, with a verbal understanding of the material. Patient agrees to go over the instructions while at home for a better understanding.  The opportunity to ask questions was provided.

## 2022-05-19 DIAGNOSIS — G459 Transient cerebral ischemic attack, unspecified: Secondary | ICD-10-CM

## 2022-05-23 ENCOUNTER — Ambulatory Visit (INDEPENDENT_AMBULATORY_CARE_PROVIDER_SITE_OTHER): Payer: Medicare Other | Admitting: Neurology

## 2022-05-23 ENCOUNTER — Other Ambulatory Visit (INDEPENDENT_AMBULATORY_CARE_PROVIDER_SITE_OTHER): Payer: Medicare Other

## 2022-05-23 ENCOUNTER — Encounter: Payer: Self-pay | Admitting: Neurology

## 2022-05-23 VITALS — BP 140/70 | HR 90 | Ht 67.0 in | Wt 262.2 lb

## 2022-05-23 DIAGNOSIS — E782 Mixed hyperlipidemia: Secondary | ICD-10-CM | POA: Diagnosis not present

## 2022-05-23 DIAGNOSIS — I10 Essential (primary) hypertension: Secondary | ICD-10-CM

## 2022-05-23 DIAGNOSIS — I161 Hypertensive emergency: Secondary | ICD-10-CM

## 2022-05-23 DIAGNOSIS — G459 Transient cerebral ischemic attack, unspecified: Secondary | ICD-10-CM | POA: Diagnosis not present

## 2022-05-23 LAB — TSH: TSH: 1.33 u[IU]/mL (ref 0.35–5.50)

## 2022-05-23 NOTE — Patient Instructions (Signed)
I saw you today for the episodes of numbness on your left face, hand, and feet. Your symptoms are likely due to transient ischemic attack, or temporary reduced blood flow to your brain.  Your main risk factor is high blood pressure. I would like to check your thyroid today to make sure this isn't contributing.  Continue aspirin 81 mg daily and plavix 75 mg daily for 2 more weeks (3 weeks total). After 2 more weeks, stop the plavix and just take aspirin 81 mg daily. After 3 weeks, the risk of bleeding outweighs the benefits of being on both aspirin and plavix.  Continue taking your blood pressure medications and your cholesterol medication, atorvastatin 80 mg daily.  I would like to see you back in clinic in 3 months. You are to call if you have any more episodes. We can see you sooner if needed.  If you have any symptoms of stroke, one sided weakness, numbness, difficulty talking, face droop, call 911 and go to the nearest ED.  The physicians and staff at [facility] are committed to providing excellent care. You may receive a survey requesting feedback about your experience at our office. We strive to receive "very good" responses to the survey questions. If you feel that your experience would prevent you from giving the office a "very good " response, please contact our office to try to remedy the situation. We may be reached at [phone]. Thank you for taking the time out of your busy day to complete the survey.  Kai Levins, MD [facility]

## 2022-05-24 ENCOUNTER — Ambulatory Visit (HOSPITAL_COMMUNITY): Admission: RE | Admit: 2022-05-24 | Payer: Medicare Other | Source: Ambulatory Visit | Admitting: Otolaryngology

## 2022-05-24 SURGERY — SURGERY, PARANASAL SINUS, ENDOSCOPIC, WITH NASAL SEPTOPLASTY, TURBINOPLASTY, AND MAXILLARY SINUSOTOMY
Anesthesia: General | Laterality: Right

## 2022-05-31 DIAGNOSIS — G459 Transient cerebral ischemic attack, unspecified: Secondary | ICD-10-CM | POA: Diagnosis not present

## 2022-06-09 ENCOUNTER — Other Ambulatory Visit: Payer: Self-pay | Admitting: Family Medicine

## 2022-06-13 ENCOUNTER — Ambulatory Visit: Payer: Medicare Other | Admitting: Family Medicine

## 2022-06-16 ENCOUNTER — Encounter: Payer: Self-pay | Admitting: Family Medicine

## 2022-06-16 ENCOUNTER — Ambulatory Visit (INDEPENDENT_AMBULATORY_CARE_PROVIDER_SITE_OTHER): Payer: Medicare Other | Admitting: Family Medicine

## 2022-06-16 VITALS — BP 96/61 | HR 81 | Temp 97.8°F | Ht 67.0 in | Wt 264.6 lb

## 2022-06-16 DIAGNOSIS — I472 Ventricular tachycardia, unspecified: Secondary | ICD-10-CM | POA: Diagnosis not present

## 2022-06-16 DIAGNOSIS — G459 Transient cerebral ischemic attack, unspecified: Secondary | ICD-10-CM | POA: Diagnosis not present

## 2022-06-16 DIAGNOSIS — I10 Essential (primary) hypertension: Secondary | ICD-10-CM

## 2022-06-16 DIAGNOSIS — I471 Supraventricular tachycardia, unspecified: Secondary | ICD-10-CM

## 2022-06-16 NOTE — Progress Notes (Signed)
OFFICE VISIT  06/16/2022  CC: No chief complaint on file.   Patient is a 72 y.o. male who presents for 1 month follow-up hypertension and recent TIA. A/P as of last visit: "1 hypertension, well-controlled on losartan 100 mg a day and Toprol-XL 50 mg a day.   2.  TIA on 04/21/2022. He had a brief recurrence of milder symptoms 3 days ago. Evaluation when in the hospital for his TIA was suggestive of small vessel cerebrovascular disease. He went home on aspirin and statin. Will discontinue aspirin and start Plavix 75 mg a day. Continue statin.  Blood pressure control is good. Hemoglobin A1c was normal earlier this month. Will complete workup with outpatient rhythm monitoring--ordered today. Refer to neurology as well."  INTERIM HX: Feeling very well.  No neurologic symptoms.  He saw the neurologist 05/23/22 and he was continued on Plavix and aspirin with the plan of switching to aspirin monotherapy after 3 weeks total Plavix.  His 7-day outpatient rhythm monitoring showed predominant underlying sinus rhythm with some runs of V. tach and SVT of up to 10 beats. He has not had any palpitations or dizziness.  His home blood pressures were reviewed today and they are consistently in the 120s over 70s.  Past Medical History:  Diagnosis Date   BPH with obstruction/lower urinary tract symptoms    +Hx of acute urinary retention/acute cystitis: responding fairly well to tamsulosin and finasteride as of 09/2016 urol f/u.  Urolift and bladd bx 02/2018-->doing great as of 07/2019 urol f/u.   Deafness in right ear    Erythrocytosis    stable dating back to 2009 (17-18 range). Iron normal. No s/s of OSA.  Epo normal.  Just following as long as stable as of 10/2020 (no hem/onc ref)   Gross hematuria    hematuria protocol w/u being done by urol as of their 01/23/18 note.   History of back injury    Back fracture fell from tree   History of erectile dysfunction    History of fracture of right ankle  05/2015   History of kidney stones    History of staph infection 10 yrs ago   after back surgery   HTN (hypertension)    Hyperlipidemia    Low back pain    Microhematuria    chronic; pt gets periodic cystoscopy for monitoring, most recent 07/30/20-->bx showed benign urothelium and submucosa, with eosinophilic cystitis.   Nephrolithiasis 2015   CT showed 2cm left sided stone which was stable since 2006 imaging and likely in renal parenchyma.  2.5 cm as of 08/2017 neph f/u: renal u/s 12/2017-->no hydro. KUB showed stable L sided 18 mm stone. Cystoscopy 12/2017 erythematous mucosa at dome, no mass.    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  Annotation: previous methicillin sensitive  staphylococcus aureus 2006 Qualifier: Diagnosis of  By: Johnnye Sima MD, Dellis Filbert  (ID Specialist)     Prostate cancer screening    via Urol--> PSA 1.39 April 2019. 1.36 Feb 2020.   TIA (transient ischemic attack)    03/2022   Tinnitus, left ear     Past Surgical History:  Procedure Laterality Date   COLONOSCOPY  10/12/2015   repeat 5 years   COLONOSCOPY W/ POLYPECTOMY  2007   Negative 2012 and 2017; Dr Ollen Barges 2022.   CYSTOSCOPY     with bladder biopsy:  dome of bladder with an area of chronic inflammation.  No malignancy.   CYSTOSCOPY WITH BIOPSY N/A 02/19/2018   Procedure: CYSTOSCOPY WITH BIOPSY/ FULGURATION / UROLIFT;  Surgeon: Festus Aloe, MD;  Location: [facility];  Service: Urology;  Laterality: N/A;   CYSTOSCOPY WITH FULGERATION N/A 07/30/2020   Bx benign. Procedure: CYSTOSCOPY WITH BLADDER BIOPSY/FULGERATION;  Surgeon: Festus Aloe, MD;  Location[facility];  Service: Urology;  Laterality: N/A;   CYSTOSCOPY WITH INSERTION OF UROLIFT  02/19/2018   LUMBAR LAMINECTOMY     Dr Vertell Limber performed 2nd & 3rd procedures (pt also has hx of fall and vertebral fracture years prior to his surgeries.    TONSILLECTOMY  as child   TRANSTHORACIC ECHOCARDIOGRAM     03/2022 EF normal, grd I DD, mild AI, mod Pulm insuff    Outpatient Medications Prior to Visit  Medication Sig Dispense Refill   Ascorbic Acid (VITAMIN C) 1000 MG tablet Take 1,000 mg by mouth at bedtime.     aspirin EC 81 MG tablet Take 81 mg by mouth daily. Swallow whole.     atorvastatin (LIPITOR) 80 MG tablet Take 1 tablet (80 mg total) by mouth daily. 30 tablet 1   cloNIDine (CATAPRES) 0.1 MG tablet Take 1 tablet (0.1 mg total) by mouth daily as needed (Take once if systolic blood pressure over 170). 30 tablet 0   clopidogrel (PLAVIX) 75 MG tablet Take 1 tablet (75 mg total) by mouth daily. 30 tablet 1   finasteride (PROSCAR) 5 MG tablet TAKE 1 TABLET BY MOUTH EVERYDAY AT BEDTIME 90 tablet 1   fluticasone (FLONASE) 50 MCG/ACT nasal spray Place 1 spray into both nostrils 2 (two) times daily.     losartan (COZAAR) 100 MG tablet TAKE 1 TABLET BY MOUTH EVERY DAY 90 tablet 3   metoprolol succinate (TOPROL-XL) 50 MG 24 hr tablet Take 1 tablet (50 mg total) by mouth daily. Take with or immediately following a meal. 90 tablet 3   pantoprazole (PROTONIX) 40 MG tablet Take 1 tablet (40 mg total) by mouth daily. 90 tablet 1   tamsulosin (FLOMAX) 0.4 MG CAPS capsule TAKE 1 CAPSULE BY MOUTH EVERY DAY 90 capsule 1   albuterol (VENTOLIN HFA) 108 (90 Base) MCG/ACT inhaler TAKE 2 PUFFS BY MOUTH EVERY 6 HOURS AS NEEDED FOR WHEEZE OR SHORTNESS OF BREATH (Patient not taking: Reported on 06/16/2022) 6.7 each 0   No facility-administered medications prior to visit.    No Known Allergies  Review of Systems As per HPI  PE:    06/16/2022    8:21 AM 05/23/2022    8:55 AM 05/23/2022    8:20 AM  Vitals with BMI  Height '5\' 7"'$   '5\' 7"'$   Weight 264 lbs 10 oz  262 lbs 3 oz  BMI 31.51  76.16  Systolic 96 073 710  Diastolic 61 70 626  Pulse 81  90     Physical Exam  Gen: Alert, well appearing.  Patient is oriented to person, place, time, and  situation. AFFECT: pleasant, lucid thought and speech. No further exam today  LABS:  Last CBC Lab Results  Component Value Date   WBC 8.1 04/30/2022   HGB 17.5 (H) 04/30/2022   HCT 51.4 04/30/2022   MCV 88.3 04/30/2022   MCH 30.1 04/30/2022   RDW 14.6 04/30/2022   PLT 226 94/85/4627   Last metabolic panel Lab Results  Component Value Date   GLUCOSE 92 04/30/2022   NA 138 04/30/2022   K 4.5 04/30/2022   CL 104 04/30/2022   CO2  24 04/30/2022   BUN 14 04/30/2022   CREATININE 0.92 04/30/2022   GFRNONAA >60 04/30/2022   CALCIUM 8.7 (L) 04/30/2022   PHOS 2.6 06/19/2012   PROT 7.2 04/21/2022   ALBUMIN 3.7 04/21/2022   BILITOT 0.8 04/21/2022   ALKPHOS 71 04/21/2022   AST 21 04/21/2022   ALT 21 04/21/2022   ANIONGAP 10 04/30/2022   Last lipids Lab Results  Component Value Date   CHOL 118 04/22/2022   HDL 33 (L) 04/22/2022   LDLCALC 70 04/22/2022   LDLDIRECT 133.0 06/26/2016   TRIG 74 04/22/2022   CHOLHDL 3.6 04/22/2022   Last hemoglobin A1c Lab Results  Component Value Date   HGBA1C 5.6 04/22/2022   Lab Results  Component Value Date   TSH 1.33 05/23/2022   IMPRESSION AND PLAN:  #1 TIA. No further neurologic symptoms. He has seen the neurologist, plan is to continue ASA 81 mg a day and he can discontinue Plavix now.  #2 ventricular tachycardia and supraventricular tachycardia: Brief runs on recent Zio patch. Asymptomatic. He is on Toprol 50 mg a day at this point. We discussed the possibility of a cardiologist consultation to see if anything further to do at this time.  He says he will find the name of one his son knows and if he can't arrange appointment himself he will call and ask for referral.  #3 hypertension, well-controlled on losartan 100 mg a day and Toprol-XL 50 mg a day.  He has clonidine 0.1 mg to take as needed systolic blood pressure over 170.  No labs needed today.  An After Visit Summary was printed and given to the patient.  FOLLOW UP:  Return in about 6 months (around 12/15/2022) for routine chronic illness f/u.  Signed:  Crissie Sickles, MD           06/16/2022

## 2022-06-16 NOTE — Patient Instructions (Addendum)
Dx for cardiologist:  Arrhythmia

## 2022-06-19 ENCOUNTER — Telehealth: Payer: Self-pay | Admitting: Family Medicine

## 2022-06-19 NOTE — Telephone Encounter (Signed)
Spoke with patient he stated he do not want to do any Wellness Visits.  Do not call

## 2022-06-20 DIAGNOSIS — D225 Melanocytic nevi of trunk: Secondary | ICD-10-CM | POA: Diagnosis not present

## 2022-06-20 DIAGNOSIS — L814 Other melanin hyperpigmentation: Secondary | ICD-10-CM | POA: Diagnosis not present

## 2022-06-20 DIAGNOSIS — L570 Actinic keratosis: Secondary | ICD-10-CM | POA: Diagnosis not present

## 2022-06-20 DIAGNOSIS — D492 Neoplasm of unspecified behavior of bone, soft tissue, and skin: Secondary | ICD-10-CM | POA: Diagnosis not present

## 2022-06-20 DIAGNOSIS — L821 Other seborrheic keratosis: Secondary | ICD-10-CM | POA: Diagnosis not present

## 2022-06-23 ENCOUNTER — Telehealth: Payer: Self-pay

## 2022-06-23 DIAGNOSIS — I471 Supraventricular tachycardia, unspecified: Secondary | ICD-10-CM

## 2022-06-23 DIAGNOSIS — I472 Ventricular tachycardia, unspecified: Secondary | ICD-10-CM

## 2022-06-23 NOTE — Telephone Encounter (Signed)
Please advise on referral. See note from last visit: "We discussed the possibility of a cardiologist consultation to see if anything further to do at this time.  He says he will find the name of one his son knows and if he can't arrange appointment himself he will call and ask for referral. "

## 2022-06-23 NOTE — Telephone Encounter (Signed)
Patient is under the impression that Dr. Anitra Lauth wanted patient to seek out a cardiologist.  Patient needs referral.  Would like to go to [facility]

## 2022-06-23 NOTE — Telephone Encounter (Signed)
OK, referral to Dr. Caryl Comes ordered

## 2022-06-27 ENCOUNTER — Telehealth: Payer: Self-pay | Admitting: Family Medicine

## 2022-06-27 NOTE — Telephone Encounter (Signed)
Pt informed

## 2022-06-27 NOTE — Telephone Encounter (Signed)
Pt needs refill on Atorvastatin. Pharmacy is correct. He is unsure if he will need an office visit since this was written by another provider. Or if Dr. Anitra Lauth can write a new prescription.

## 2022-06-28 NOTE — Telephone Encounter (Signed)
Please advise. Pt was in [place] in December .

## 2022-06-29 MED ORDER — ATORVASTATIN CALCIUM 80 MG PO TABS: 80.0000 mg | ORAL_TABLET | Freq: Every day | ORAL | 3 refills | Status: DC

## 2022-06-29 NOTE — Telephone Encounter (Signed)
Pt is calling to follow up on medication request for Atorvastatin.

## 2022-06-29 NOTE — Telephone Encounter (Signed)
Atorvastatin rx sent

## 2022-06-29 NOTE — Telephone Encounter (Signed)
Please advise 

## 2022-06-29 NOTE — Telephone Encounter (Signed)
Pt advised refill sent. °

## 2022-07-14 ENCOUNTER — Other Ambulatory Visit: Payer: Self-pay | Admitting: Family Medicine

## 2022-07-31 ENCOUNTER — Ambulatory Visit (INDEPENDENT_AMBULATORY_CARE_PROVIDER_SITE_OTHER): Payer: Medicare Other | Admitting: Internal Medicine

## 2022-07-31 ENCOUNTER — Ambulatory Visit (HOSPITAL_COMMUNITY)
Admission: RE | Admit: 2022-07-31 | Discharge: 2022-07-31 | Disposition: A | Discharge status: Home / Self Care | Payer: Medicare Other | Source: Ambulatory Visit | Attending: Internal Medicine | Admitting: Internal Medicine

## 2022-07-31 ENCOUNTER — Encounter: Payer: Self-pay | Admitting: Internal Medicine

## 2022-07-31 VITALS — BP 116/78 | HR 79 | Ht 67.0 in | Wt 266.2 lb

## 2022-07-31 DIAGNOSIS — I4729 Other ventricular tachycardia: Secondary | ICD-10-CM | POA: Insufficient documentation

## 2022-07-31 DIAGNOSIS — G4730 Sleep apnea, unspecified: Secondary | ICD-10-CM | POA: Diagnosis not present

## 2022-07-31 DIAGNOSIS — I10 Essential (primary) hypertension: Secondary | ICD-10-CM

## 2022-07-31 DIAGNOSIS — Z79899 Other long term (current) drug therapy: Secondary | ICD-10-CM

## 2022-07-31 DIAGNOSIS — I493 Ventricular premature depolarization: Secondary | ICD-10-CM | POA: Insufficient documentation

## 2022-07-31 DIAGNOSIS — G459 Transient cerebral ischemic attack, unspecified: Secondary | ICD-10-CM

## 2022-07-31 DIAGNOSIS — Z01812 Encounter for preprocedural laboratory examination: Secondary | ICD-10-CM | POA: Insufficient documentation

## 2022-07-31 DIAGNOSIS — Z8673 Personal history of transient ischemic attack (TIA), and cerebral infarction without residual deficits: Secondary | ICD-10-CM | POA: Diagnosis not present

## 2022-07-31 DIAGNOSIS — I472 Ventricular tachycardia, unspecified: Secondary | ICD-10-CM

## 2022-07-31 MED ORDER — METOPROLOL SUCCINATE ER 100 MG PO TB24: 100.0000 mg | ORAL_TABLET | Freq: Every day | ORAL | 3 refills | Status: DC

## 2022-07-31 MED ORDER — LOSARTAN POTASSIUM-HCTZ 100-12.5 MG PO TABS: 1.0000 | ORAL_TABLET | Freq: Every day | ORAL | 3 refills | Status: DC

## 2022-07-31 MED ORDER — FUROSEMIDE 20 MG PO TABS: ORAL_TABLET | ORAL | 0 refills | Status: DC

## 2022-07-31 NOTE — Patient Instructions (Signed)
Medication Instructions:  Your physician has recommended you make the following change in your medication:   Furosemide '20mg'$  - 1 tablet by mouth every other day x 4 doses.  Then STOP.  Stop Losartan  Begin Losartan/HCTZ - 100/12.'5mg'$  -1 tablet by mouth daily.  Increase Metoprolol Succinate to '100mg'$  - 1 tablet by mouth daily   *If you need a refill on your cardiac medications before your next appointment, please call your pharmacy*   Lab Work:  CBC and BMET as scheduled If you have labs (blood work) drawn today and your tests are completely normal, you will receive your results only by: [place] (if you have MyChart) OR A paper copy in the mail If you have any lab test that is abnormal or we need to change your treatment, we will call you to review the results.   Testing/Procedures: Your physician has requested that you have a cardiac MRI. Cardiac MRI uses a computer to create images of your heart as its beating, producing both still and moving pictures of your heart and major blood vessels. For further information please visit [email or web address] Please follow the instruction sheet given to you today for more information.   Your physician has recommended that you have a sleep study. This test records several body functions during sleep, including: brain activity, eye movement, oxygen and carbon dioxide blood levels, heart rate and rhythm, breathing rate and rhythm, the flow of air through your mouth and nose, snoring, body muscle movements, and chest and belly movement.   Signal Average ECG per Dr Caryl Comes   Follow-Up: At [facility], you and your health needs are our priority.  As part of our continuing mission to provide you with exceptional heart care, we have created designated Provider Care Teams.  These Care Teams include your primary Cardiologist (physician) and Advanced Practice Providers (APPs -  Physician Assistants and Nurse Practitioners) who all work  together to provide you with the care you need, when you need it.  We recommend signing up for the patient portal called "MyChart".  Sign up information is provided on this After Visit Summary.  MyChart is used to connect with patients for Virtual Visits (Telemedicine).  Patients are able to view lab/test results, encounter notes, upcoming appointments, etc.  Non-urgent messages can be sent to your provider as well.   To learn more about what you can do with MyChart, go to [email or web address].    Your next appointment:   Loop Recorder to be scheduled - You will be contacted by Dr Olin Pia scheduler

## 2022-07-31 NOTE — Progress Notes (Signed)
ELECTROPHYSIOLOGY CONSULT NOTE  Patient ID: Derrick [name], MRN: [id number], DOB/AGE: 74-Feb-1952 74 y.o. Admit date: (Not on file) Date of Consult: 07/31/2022  Primary Physician: [name], MD Primary Cardiologist: new     Derrick [name] is a 72 y.o. male who is being seen today for the evaluation of VT at the request of Dr. Normajean Baxter.    HPI Derrick [name] is a 72 y.o. male referred following an event recorder undertaken for palpitations which demonstrated nonsustained ventricular tachycardia x 2 and nonsustained atrial tachycardia.  History of TIA 12/23 in the context of hypertension manifested by aphasia and weakness.  Recovered..  Head CT/MRI were negative; treated with antiplatelet therapy.  Echocardiogram at that time demonstrated normal LV function  Sleep disordered breathing and daytime somnolence.  Longstanding hypertension.  Dyspnea on exertion but without edema.  Denies chest pain.  No orthopnea or PND.    DATE TEST EF   12/23 Echo   65-70 %         Date Cr K Hgb  12/23 0.92 4.5 17.5            Past Medical History:  Diagnosis Date   BPH with obstruction/lower urinary tract symptoms    +Hx of acute urinary retention/acute cystitis: responding fairly well to tamsulosin and finasteride as of 09/2016 urol f/u.  Urolift and bladd bx 02/2018-->doing great as of 07/2019 urol f/u.   Deafness in right ear    Erythrocytosis    stable dating back to 2009 (17-18 range). Iron normal. No s/s of OSA.  Epo normal.  Just following as long as stable as of 10/2020 (no hem/onc ref)   Gross hematuria    hematuria protocol w/u being done by urol as of their 01/23/18 note.   History of back injury    Back fracture fell from tree   History of erectile dysfunction    History of fracture of right ankle 05/2015   History of kidney stones    History of staph infection 10 yrs ago   after back surgery   HTN (hypertension)    Hyperlipidemia    Low back pain     Microhematuria    chronic; pt gets periodic cystoscopy for monitoring, most recent 07/30/20-->bx showed benign urothelium and submucosa, with eosinophilic cystitis.   Nephrolithiasis 2015   CT showed 2cm left sided stone which was stable since 2006 imaging and likely in renal parenchyma.  2.5 cm as of 08/2017 neph f/u: renal u/s 12/2017-->no hydro. KUB showed stable L sided 18 mm stone. Cystoscopy 12/2017 erythematous mucosa at dome, no mass.    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  Annotation: previous methicillin sensitive  staphylococcus aureus 2006 Qualifier: Diagnosis of  By: Johnnye Sima MD, Dellis Filbert  (ID Specialist)     Prostate cancer screening    via Urol--> PSA 1.39 April 2019. 1.36 Feb 2020.   TIA (transient ischemic attack)    03/2022   Tinnitus, left ear       Surgical History:  Past Surgical History:  Procedure Laterality Date   COLONOSCOPY  10/12/2015   repeat 5 years   COLONOSCOPY W/ POLYPECTOMY  2007   Negative 2012 and 2017; Dr Ollen Barges 2022.   CYSTOSCOPY     with bladder biopsy:  dome of bladder with an area of chronic inflammation.  No malignancy.   CYSTOSCOPY WITH BIOPSY N/A  02/19/2018   Procedure: CYSTOSCOPY WITH BIOPSY/ FULGURATION / UROLIFT;  Surgeon: Festus Aloe, MD;  Location: [facility];  Service: Urology;  Laterality: N/A;   CYSTOSCOPY WITH FULGERATION N/A 07/30/2020   Bx benign. Procedure: CYSTOSCOPY WITH BLADDER BIOPSY/FULGERATION;  Surgeon: Festus Aloe, MD;  Location[facility];  Service: Urology;  Laterality: N/A;   CYSTOSCOPY WITH INSERTION OF UROLIFT  02/19/2018   LUMBAR LAMINECTOMY     Dr Vertell Limber performed 2nd & 3rd procedures (pt also has hx of fall and vertebral fracture years prior to his surgeries.   TONSILLECTOMY  as child   TRANSTHORACIC ECHOCARDIOGRAM     03/2022 EF normal, grd I DD, mild AI, mod Pulm insuff     Home Meds: Current Meds   Medication Sig   Ascorbic Acid (VITAMIN C) 1000 MG tablet Take 1,000 mg by mouth at bedtime.   aspirin EC 81 MG tablet Take 81 mg by mouth daily. Swallow whole.   atorvastatin (LIPITOR) 80 MG tablet Take 1 tablet (80 mg total) by mouth daily.   cloNIDine (CATAPRES) 0.1 MG tablet Take 1 tablet (0.1 mg total) by mouth daily as needed (Take once if systolic blood pressure over 170).   clopidogrel (PLAVIX) 75 MG tablet Take 1 tablet (75 mg total) by mouth daily.   finasteride (PROSCAR) 5 MG tablet TAKE 1 TABLET BY MOUTH EVERYDAY AT BEDTIME   fluticasone (FLONASE) 50 MCG/ACT nasal spray Place 1 spray into both nostrils 2 (two) times daily.   losartan (COZAAR) 100 MG tablet TAKE 1 TABLET BY MOUTH EVERY DAY   metoprolol succinate (TOPROL-XL) 50 MG 24 hr tablet Take 1 tablet (50 mg total) by mouth daily. TAKE WITH OR IMMEDIATELY FOLLOWING A MEAL. MUST KEEP APPT FOR FURTHER REFILLS   pantoprazole (PROTONIX) 40 MG tablet Take 1 tablet (40 mg total) by mouth daily.   tamsulosin (FLOMAX) 0.4 MG CAPS capsule TAKE 1 CAPSULE BY MOUTH EVERY DAY    Allergies: No Known Allergies  Social History   Socioeconomic History   Marital status: Married    Spouse name: Not on file   Number of children: 1   Years of education: Not on file   Highest education level: Not on file  Occupational History   Not on file  Tobacco Use   Smoking status: Former    Packs/day: 2.00    Years: 20.00    Total pack years: 40.00    Types: Cigarettes    Quit date: 05/22/1973    Years since quitting: 49.2   Smokeless tobacco: Current    Types: Chew   Tobacco comments:    35 years ago as of 2013  Vaping Use   Vaping Use: Never used  Substance and Sexual Activity   Alcohol use: Not Currently   Drug use: No   Sexual activity: Not on file  Other Topics Concern   Not on file  Social History Narrative   Married, 1 son and 2 grandchildren.   Orig from [place] area, [employer].   Retired Electrical engineer (age 43).   [place]  Belongs to [employer].         Social Determinants of Health   Financial Resource Strain: Not on file  Food Insecurity: No Food Insecurity (04/22/2022)   Hunger Vital Sign    Worried About Running Out of Food in the Last Year: Never true    Ran Out of Food in the Last Year: Never true  Transportation Needs: No Transportation Needs (04/22/2022)  PRAPARE - Hydrologist (Medical): No    Lack of Transportation (Non-Medical): No  Physical Activity: Not on file  Stress: Not on file  Social Connections: Not on file  Intimate Partner Violence: Not At Risk (04/22/2022)   Humiliation, Afraid, Rape, and Kick questionnaire    Fear of Current or Ex-Partner: No    Emotionally Abused: No    Physically Abused: No    Sexually Abused: No     Family History  Problem Relation Age of Onset   Stroke Mother    Cancer Mother 98       melanoma   Cancer Father 39       lung cancer; [employer] 2 (nonsmoker)   Heart disease Maternal Grandmother    Heart disease Maternal Grandfather    Heart disease Paternal Grandmother    Cancer Brother        bladder cancer     ROS:  Please see the history of present illness.     All other systems reviewed and negative.    Physical Exam: Blood pressure 116/78, pulse 79, height '5\' 7"'$  (1.702 m), weight 266 lb 3.2 oz (120.7 kg), SpO2 93 %. General: Well developed, well nourished male in no acute distress. Head: Normocephalic, atraumatic, sclera non-icteric, no xanthomas, nares are without discharge. EENT: normal  Lymph Nodes:  none Neck: Negative for carotid bruits. JVD 8 cm back:without scoliosis kyphosis Lungs: Clear bilaterally to auscultation without wheezes, rales, or rhonchi. Breathing is unlabored. Heart: RRR with S1 S2. No   murmur . No rubs, or gallops appreciated. Abdomen: Soft, non-tender, non-distended with normoactive bowel sounds. No hepatomegaly. No rebound/guarding. No obvious abdominal  masses. Msk:  Strength and tone appear normal for age. Extremities: No clubbing or cyanosis. 2+ edema.  Distal pedal pulses are 2+ and equal bilaterally. Skin: Warm and Dry Neuro: Alert and oriented X 3. CN III-XII intact Grossly normal sensory and motor function . Psych:  Responds to questions appropriately with a normal affect.        EKG: Sinus rhythm at 79 Level 16/09/38  Event recorder was reviewed nonsustained ventricular tachycardia 2 bursts rapid, cycle length of about 250 ms 1 of 13 beats and 1 of 6 beats. Pain atrial tachycardia   Assessment and Plan:  Ventricular tachycardia-nonsustained  Tachycardia-nonsustained  TIA  Hypertension  Heart failure with preserved ejection fraction  Sleep disordered breathing daytime fatigue consistent with sleep apnea  Morbid obesity  Erythrocytosis/polycythemia    The patient has nonsustained ventricular tachycardia in the setting of what appears to be a normal heart based on echo and ECG.  Further elucidation is I think appropriate given the very rapid and closely coupled Nonsustained ventricular tachycardia runs so we will undertake single average ECG and cardiac MRI to exclude occult structural heart disease..  In the interim, we will increase his metoprolol from 50--100 mg daily.  He has evidence of volume overload.  With his dyspnea, we will plan to change his losartan 100  to losartan/HCT100/12.5 but given furosemide 20 mg q. OD x 4 doses to get started.  Given his TIA, and review atrial tachycardia I recommended that we undertake loop recorder implantation.  Now at 3 months postevent, we will have him discontinue his aspirin and continue on clopidogrel.  He will continue on a statin.  With sleep as ordered breathing, we will undertake a sleep study.  Polycythemia is probably related to sleep apnea, we will follow this along, if hemoglobin continues  to increase may need to be excluded for polycythemia vera   Derrick [name]    Addendum signal average ECG is normal

## 2022-08-01 ENCOUNTER — Ambulatory Visit (INDEPENDENT_AMBULATORY_CARE_PROVIDER_SITE_OTHER): Payer: Medicare Other | Admitting: Family Medicine

## 2022-08-01 ENCOUNTER — Telehealth: Payer: Self-pay | Admitting: Internal Medicine

## 2022-08-01 ENCOUNTER — Other Ambulatory Visit (HOSPITAL_COMMUNITY): Payer: Medicare Other

## 2022-08-01 ENCOUNTER — Encounter: Payer: Self-pay | Admitting: Family Medicine

## 2022-08-01 VITALS — BP 147/75 | HR 76 | Temp 97.5°F | Ht 67.0 in | Wt 265.6 lb

## 2022-08-01 DIAGNOSIS — Z23 Encounter for immunization: Secondary | ICD-10-CM

## 2022-08-01 DIAGNOSIS — I10 Essential (primary) hypertension: Secondary | ICD-10-CM

## 2022-08-01 MED ORDER — PANTOPRAZOLE SODIUM 40 MG PO TBEC: 40.0000 mg | DELAYED_RELEASE_TABLET | Freq: Every day | ORAL | 1 refills | Status: DC

## 2022-08-01 MED ORDER — FINASTERIDE 5 MG PO TABS: ORAL_TABLET | ORAL | 1 refills | Status: DC

## 2022-08-01 MED ORDER — ZOSTER VAC RECOMB ADJUVANTED 50 MCG/0.5ML IM SUSR: 0.5000 mL | Freq: Once | INTRAMUSCULAR | 0 refills | Status: AC

## 2022-08-01 NOTE — Telephone Encounter (Addendum)
Spoke with pt's wife,DPR who states pt does not have Plavix on hand and has not taken Plavix since December 2023.   Pt's wife advised will make Dr Caryl Comes aware and await his recommendation. Pt's wife verbalizes understanding and thanked Therapist, sports for the callback.

## 2022-08-01 NOTE — Addendum Note (Signed)
Addended by: Michelle Nasuti on: 08/01/2022 01:43 PM   Modules accepted: Orders

## 2022-08-01 NOTE — Progress Notes (Signed)
OFFICE VISIT  08/01/2022  CC:  Chief Complaint  Patient presents with   Medical Management of Chronic Issues    Pt is fasting   Patient is a 72 y.o. male who presents for follow-up hypertension and hyperlipidemia.  INTERIM HX: Eastyn feels well.  An event monitor in Jan this year showed nonsustained ventricular tachycardia.  He saw Dr. Caryl Comes yesterday and the recommendations were to get further testing with single average ECG and cardiac MRI.  Also Loop recorder implantation.  His metoprolol was increased from 50 to 100 mg daily. Additionally, he had evidence of volume overload and they changed his losartan to losartan/HCT 100/12.5 daily and also gave furosemide 20 mg every other day x 4 doses. His aspirin was discontinued and he was continued on clopidogrel. They also ordered a sleep study.  Says blood pressures at home are consistently in the 120s over 70s.  ROS as above, plus--> no fevers, no CP, no SOB, no wheezing, no cough, no dizziness, no HAs, no rashes, no melena/hematochezia.  No polyuria or polydipsia.  No myalgias or arthralgias.  No focal weakness, paresthesias, or tremors.  No acute vision or hearing abnormalities.  No dysuria or unusual/new urinary urgency or frequency.  No recent changes in lower legs. No n/v/d or abd pain.  No palpitations.     Past Medical History:  Diagnosis Date   BPH with obstruction/lower urinary tract symptoms    +Hx of acute urinary retention/acute cystitis: responding fairly well to tamsulosin and finasteride as of 09/2016 urol f/u.  Urolift and bladd bx 02/2018-->doing great as of 07/2019 urol f/u.   Deafness in right ear    Diastolic dysfunction    Erythrocytosis    stable dating back to 2009 (17-18 range). Iron normal. No s/s of OSA.  Epo normal.  Just following as long as stable as of 10/2020 (no hem/onc ref)   Gross hematuria    hematuria protocol w/u being done by urol as of their 01/23/18 note.   History of back injury    Back fracture  fell from tree   History of erectile dysfunction    History of fracture of right ankle 05/2015   History of kidney stones    History of staph infection 10 yrs ago   after back surgery   HTN (hypertension)    Hyperlipidemia    Low back pain    Microhematuria    chronic; pt gets periodic cystoscopy for monitoring, most recent 07/30/20-->bx showed benign urothelium and submucosa, with eosinophilic cystitis.   Nephrolithiasis 2015   CT showed 2cm left sided stone which was stable since 2006 imaging and likely in renal parenchyma.  2.5 cm as of 08/2017 neph f/u: renal u/s 12/2017-->no hydro. KUB showed stable L sided 18 mm stone. Cystoscopy 12/2017 erythematous mucosa at dome, no mass.    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  Annotation: previous methicillin sensitive  staphylococcus aureus 2006 Qualifier: Diagnosis of  By: Johnnye Sima MD, Dellis Filbert  (ID Specialist)     Prostate cancer screening    via Urol--> PSA 1.39 April 2019. 1.36 Feb 2020.   TIA (transient ischemic attack)    03/2022   Tinnitus, left ear     Past Surgical History:  Procedure Laterality Date   COLONOSCOPY  10/12/2015   repeat 5 years   COLONOSCOPY W/ POLYPECTOMY  2007   Negative 2012 and 2017; Dr Ollen Barges 2022.  CYSTOSCOPY     with bladder biopsy:  dome of bladder with an area of chronic inflammation.  No malignancy.   CYSTOSCOPY WITH BIOPSY N/A 02/19/2018   Procedure: CYSTOSCOPY WITH BIOPSY/ FULGURATION / UROLIFT;  Surgeon: Festus Aloe, MD;  Location: [facility];  Service: Urology;  Laterality: N/A;   CYSTOSCOPY WITH FULGERATION N/A 07/30/2020   Bx benign. Procedure: CYSTOSCOPY WITH BLADDER BIOPSY/FULGERATION;  Surgeon: Festus Aloe, MD;  Location: [facility];  Service: Urology;  Laterality: N/A;   CYSTOSCOPY WITH INSERTION OF UROLIFT  02/19/2018   LUMBAR LAMINECTOMY     Dr Vertell Limber performed 2nd & 3rd procedures  (pt also has hx of fall and vertebral fracture years prior to his surgeries.   TONSILLECTOMY  as child   TRANSTHORACIC ECHOCARDIOGRAM     03/2022 EF normal, grd I DD, mild AI, mod Pulm insuff    Outpatient Medications Prior to Visit  Medication Sig Dispense Refill   Ascorbic Acid (VITAMIN C) 1000 MG tablet Take 1,000 mg by mouth at bedtime.     atorvastatin (LIPITOR) 80 MG tablet Take 1 tablet (80 mg total) by mouth daily. 90 tablet 3   cloNIDine (CATAPRES) 0.1 MG tablet Take 1 tablet (0.1 mg total) by mouth daily as needed (Take once if systolic blood pressure over 170). 30 tablet 0   clopidogrel (PLAVIX) 75 MG tablet Take 1 tablet (75 mg total) by mouth daily. 30 tablet 1   fluticasone (FLONASE) 50 MCG/ACT nasal spray Place 1 spray into both nostrils 2 (two) times daily.     furosemide (LASIX) 20 MG tablet Take 1 tablet by mouth every other day x 4 doses. Then stop. 15 tablet 0   losartan-hydrochlorothiazide (HYZAAR) 100-12.5 MG tablet Take 1 tablet by mouth daily. 90 tablet 3   metoprolol succinate (TOPROL-XL) 100 MG 24 hr tablet Take 1 tablet (100 mg total) by mouth daily. Take with or immediately following a meal. 90 tablet 3   tamsulosin (FLOMAX) 0.4 MG CAPS capsule TAKE 1 CAPSULE BY MOUTH EVERY DAY 90 capsule 1   finasteride (PROSCAR) 5 MG tablet TAKE 1 TABLET BY MOUTH EVERYDAY AT BEDTIME 90 tablet 1   pantoprazole (PROTONIX) 40 MG tablet Take 1 tablet (40 mg total) by mouth daily. 90 tablet 1   No facility-administered medications prior to visit.    No Known Allergies  Review of Systems As per HPI  PE:    08/01/2022    9:32 AM 07/31/2022   10:02 AM 06/16/2022    8:21 AM  Vitals with BMI  Height '5\' 7"'$  '5\' 7"'$  '5\' 7"'$   Weight 265 lbs 10 oz 266 lbs 3 oz 264 lbs 10 oz  BMI 41.59 [id number]  Systolic [id number]  Diastolic 75 78 61  Pulse 76 79 81     Physical Exam  Gen: Alert, well appearing.  Patient is oriented to person, place, time, and situation. AFFECT: pleasant,  lucid thought and speech. CV: RRR, no m/r/g.   LUNGS: CTA bilat, nonlabored resps, good aeration in all lung fields. EXT: no clubbing or cyanosis.  2+ bilat LL pitting edema.   LABS:  Last CBC Lab Results  Component Value Date   WBC 8.1 04/30/2022   HGB 17.5 (H) 04/30/2022   HCT 51.4 04/30/2022   MCV 88.3 04/30/2022   MCH 30.1 04/30/2022   RDW 14.6 04/30/2022   PLT 226 04/30/2022   Lab Results  Component Value Date   IRON 111 11/10/2020   TIBC 280  11/10/2020   FERRITIN 180 [id number]   Last metabolic panel Lab Results  Component Value Date   GLUCOSE 92 04/30/2022   NA 138 04/30/2022   K 4.5 04/30/2022   CL 104 04/30/2022   CO2 24 04/30/2022   BUN 14 04/30/2022   CREATININE 0.92 04/30/2022   GFRNONAA >60 04/30/2022   CALCIUM 8.7 (L) 04/30/2022   PHOS 2.6 06/19/2012   PROT 7.2 04/21/2022   ALBUMIN 3.7 04/21/2022   BILITOT 0.8 04/21/2022   ALKPHOS 71 04/21/2022   AST 21 04/21/2022   ALT 21 04/21/2022   ANIONGAP 10 04/30/2022   Last lipids Lab Results  Component Value Date   CHOL 118 04/22/2022   HDL 33 (L) 04/22/2022   LDLCALC 70 04/22/2022   LDLDIRECT 133.0 06/26/2016   TRIG 74 04/22/2022   CHOLHDL 3.6 04/22/2022   Last hemoglobin A1c Lab Results  Component Value Date   HGBA1C 5.6 04/22/2022   Last thyroid functions Lab Results  Component Value Date   TSH 1.33 05/23/2022   Last vitamin D Lab Results  Component Value Date   VD25OH 40 06/21/2010   Lab Results  Component Value Date   PSA 1.36 07/04/2018   PSA 1.36 07/04/2018   PSA 1.95 06/21/2011   IMPRESSION AND PLAN:  #1 hypertension.  Blood pressure little bit up here and at recent cardiology visit.  However blood pressures at home are consistently normal.  He has been under more stress and anxiety lately due to some family being in town as well as his recent cardiology visit causing some stress. Changes as per Dr. Caryl Comes yesterday-> continue Toprol-XL 100 mg a day, losartan/HCTZ 100/12.5  daily.  #2 diastolic dysfunction, volume overload. Dr. Caryl Comes has started him on every other day Lasix for 4 doses and he also switched his antihypertensive as noted #1 above. He will need repeat electrolytes and creatinine in about 10 days.  Dr. Caryl Comes has ordered CBC and bmet--> future.  #3 NSVT. Dr. Caryl Comes following/initiated further w/u---> cardiac MRI.  #4 Hx TIA. Dr. Caryl Comes changed his ASA to plavix qd. Also plans loop recorder implantation.  #5.  Erythrocytosis, chronic. Dr. Caryl Comes is ordering sleep study.  An After Visit Summary was printed and given to the patient.  FOLLOW UP: Return in about 4 weeks (around 08/29/2022) for routine chronic illness f/u.  Signed:  Crissie Sickles, MD           08/01/2022

## 2022-08-01 NOTE — Telephone Encounter (Signed)
Pt c/o medication issue:  1. Name of Medication: clopidogrel (PLAVIX) 75 MG tablet  2. How are you currently taking this medication (dosage and times per day)?  3. Are you having a reaction (difficulty breathing--STAT)? No   4. What is your medication issue? Pt's wife Caren Griffins called in stating that when the pt saw Dr. Caryl Comes yesterday, that Dr. Caryl Comes stated he wanted the pt to stop taking asprin, and start on clopidogrel (PLAVIX) 75 MG tablet. Caren Griffins stated she did not pick that up when she went to the pharmacy and just wanted to check on it. Please advise.

## 2022-08-02 MED ORDER — ASPIRIN 81 MG PO TBEC: 81.0000 mg | DELAYED_RELEASE_TABLET | Freq: Every day | ORAL | 11 refills | Status: DC

## 2022-08-02 NOTE — Telephone Encounter (Signed)
Continuing ASA is perfect

## 2022-08-02 NOTE — Telephone Encounter (Signed)
Spoke with pt's wife, DPR and advised pt may just continue ASA '81mg'$  since pt is no longer taking Plavix.  Pt's wife verbalizes understanding and agrees with current plan.

## 2022-08-02 NOTE — Addendum Note (Signed)
Addended by: Thora Lance on: 08/02/2022 10:20 AM   Modules accepted: Orders

## 2022-08-11 NOTE — Progress Notes (Signed)
NEUROLOGY FOLLOW UP OFFICE NOTE  Derrick [name] [id number]  Subjective:  Derrick [name] is a 72 y.o. 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  To briefly review: Patient woke on 04/21/22 with left toes, fingers, and cheek being numb. It resolved after 20 minutes. He was driving later that day and noticed left fingers getting numb. He tried to open a bottle and was unable. He later had his hand go numb again and dropped a water bowl for his dogs. He was speaking to his wife and noticed to have slurred speech. Per patient, the words he said didn't make sense and seemed liked the words were slow. Per patient, his mother in law had a stroke earlier that day and maybe there was additional stress evolved. He was sent to the ED at this point. He was seen by teleneurology and NIHSS was 1 for mildly slurred speech. CTH showed no acute process (aspects 10). Given his mild symptoms, decided against IV TNKase. CTA head and neck showed no LVO or high grade stenosis. CT perfusion showed an area of ischemia corresponding to the watershed area of the right ACA/MCA and MCA/PCA junctions. MRI brain on 04/22/22 showed no acute infarct but chronic small vessel disease. Echo on 04/22/22 showed normal EF, normal LA size, no evidence of shunt. BP was elevated at the time of presentation and felt to be a factor in patient's symptoms. Patient was started on asa 81 mg daily, atorvastatin 80 mg daily and discharged on 04/22/22.   On 04/30/22 patient again had elevated SBP to 168 and had recurrence of symptoms with lip and tongue numbness. Patient had another MRI brain that was similar to MRI on 04/22/22 with no acute infarct.   Around 05/13/22 patient had acute onset of left lip and left hand numbness that lasted about 10 minutes. Per patient, the first episode was very noticeable and the  other two were minor and perhaps more concerning only because of the first episode. He has had no previous episodes since 05/13/22.   Patient is currently on asa 81 mg daily, plavix 75 mg daily, and atorvastatin 80 mg daily for secondary stroke prevention. He has been on plavix for about 1 week.  Most recent Assessment and Plan (05/23/22): His neurological examination is essentially normal today. Available diagnostic data is significant for MRI brain x 2 showing no acute infarct but severe chronic microvascular ischemia. Vessel imaging with CTA head and neck did not show significant stenosis. I discussed the results of the MRI brain with patient and showed him the images. I explained that his chronic microvascular ischemic changes and likely his TIA symptoms were due to long standing hypertension. I explained that good BP control would be important to reduce future stroke risk. Given how symptoms have been stereotyped, another possible explanation is seizure, though perhaps less likely. I discussed this with patient and symptoms to monitor for. I will continue DAPT for 3 total weeks, then only asa monotherapy thereafter.   PLAN: -Blood work: TSH -Continue asa 81 mg daily, plavix 75 mg daily for 3 total weeks (2 more weeks), then asa 81 mg monotherapy -Ziopatch - has 3 more days. Unlikely cardioembolic, but will follow up on results. -Continue atorvastatin 80 mg daily -Discussed stroke warning signs and to go to nearest ED if focal neurologic deficits appear -Patient to call if  having more episodes or with any concerns/questions  Since their last visit: TSH was normal. Ziopatch showed no evidence of afib.  Patient has not further episodes since our last visit (05/23/22).  Current medications: Patient has finished plavix. He is currently on asa 81 mg daily and atorvastatin 80 mg daily. His metoprolol was increased to 100 mg daily.  He has no new complaints today.  MEDICATIONS:  Outpatient  Encounter Medications as of 08/23/2022  Medication Sig   Ascorbic Acid (VITAMIN C) 1000 MG tablet Take 1,000 mg by mouth at bedtime.   aspirin EC 81 MG tablet Take 1 tablet (81 mg total) by mouth daily. Swallow whole.   atorvastatin (LIPITOR) 80 MG tablet Take 1 tablet (80 mg total) by mouth daily.   cloNIDine (CATAPRES) 0.1 MG tablet Take 1 tablet (0.1 mg total) by mouth daily as needed (Take once if systolic blood pressure over 170).   finasteride (PROSCAR) 5 MG tablet TAKE 1 TABLET BY MOUTH EVERYDAY AT BEDTIME   fluticasone (FLONASE) 50 MCG/ACT nasal spray Place 1 spray into both nostrils 2 (two) times daily.   furosemide (LASIX) 20 MG tablet Take 1 tablet by mouth every other day x 4 doses. Then stop.   losartan-hydrochlorothiazide (HYZAAR) 100-12.5 MG tablet Take 1 tablet by mouth daily.   metoprolol succinate (TOPROL-XL) 100 MG 24 hr tablet Take 1 tablet (100 mg total) by mouth daily. Take with or immediately following a meal.   pantoprazole (PROTONIX) 40 MG tablet Take 1 tablet (40 mg total) by mouth daily.   tamsulosin (FLOMAX) 0.4 MG CAPS capsule TAKE 1 CAPSULE BY MOUTH EVERY DAY   [DISCONTINUED] clopidogrel (PLAVIX) 75 MG tablet Take 1 tablet (75 mg total) by mouth daily.   No facility-administered encounter medications on file as of 08/23/2022.    PAST MEDICAL HISTORY: Past Medical History:  Diagnosis Date   BPH with obstruction/lower urinary tract symptoms    +Hx of acute urinary retention/acute cystitis: responding fairly well to tamsulosin and finasteride as of 09/2016 urol f/u.  Urolift and bladd bx 02/2018-->doing great as of 07/2019 urol f/u.   Deafness in right ear    Diastolic dysfunction    Erythrocytosis    stable dating back to 2009 (17-18 range). Iron normal. No s/s of OSA.  Epo normal.  Just following as long as stable as of 10/2020 (no hem/onc ref)   Gross hematuria    hematuria protocol w/u being done by urol as of their 01/23/18 note.   History of back injury    Back  fracture fell from tree   History of erectile dysfunction    History of fracture of right ankle 05/2015   History of kidney stones    History of staph infection 10 yrs ago   after back surgery   HTN (hypertension)    Hyperlipidemia    Low back pain    Microhematuria    chronic; pt gets periodic cystoscopy for monitoring, most recent 07/30/20-->bx showed benign urothelium and submucosa, with eosinophilic cystitis.   Nephrolithiasis 2015   CT showed 2cm left sided stone which was stable since 2006 imaging and likely in renal parenchyma.  2.5 cm as of 08/2017 neph f/u: renal u/s 12/2017-->no hydro. KUB showed stable L sided 18 mm stone. Cystoscopy 12/2017 erythematous mucosa at dome, no mass.    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  Annotation: previous methicillin sensitive  staphylococcus aureus  2006 Qualifier: Diagnosis of  By: Johnnye Sima MD, Dellis Filbert  (ID Specialist)     Prostate cancer screening    via Urol--> PSA 1.39 April 2019. 1.36 Feb 2020.   TIA (transient ischemic attack)    03/2022   Tinnitus, left ear     PAST SURGICAL HISTORY: Past Surgical History:  Procedure Laterality Date   COLONOSCOPY  10/12/2015   repeat 5 years   COLONOSCOPY W/ POLYPECTOMY  2007   Negative 2012 and 2017; Dr Ollen Barges 2022.   CYSTOSCOPY     with bladder biopsy:  dome of bladder with an area of chronic inflammation.  No malignancy.   CYSTOSCOPY WITH BIOPSY N/A 02/19/2018   Procedure: CYSTOSCOPY WITH BIOPSY/ FULGURATION / UROLIFT;  Surgeon: Festus Aloe, MD;  Location: [facility];  Service: Urology;  Laterality: N/A;   CYSTOSCOPY WITH FULGERATION N/A 07/30/2020   Bx benign. Procedure: CYSTOSCOPY WITH BLADDER BIOPSY/FULGERATION;  Surgeon: Festus Aloe, MD;  Location: [facility];  Service: Urology;  Laterality: N/A;   CYSTOSCOPY WITH INSERTION OF UROLIFT  02/19/2018   LUMBAR LAMINECTOMY     Dr  Vertell Limber performed 2nd & 3rd procedures (pt also has hx of fall and vertebral fracture years prior to his surgeries.   TONSILLECTOMY  as child   TRANSTHORACIC ECHOCARDIOGRAM     03/2022 EF normal, grd I DD, mild AI, mod Pulm insuff    ALLERGIES: No Known Allergies  FAMILY HISTORY: Family History  Problem Relation Age of Onset   Stroke Mother    Cancer Mother 68       melanoma   Cancer Father 60       lung cancer; [employer] 2 (nonsmoker)   Heart disease Maternal Grandmother    Heart disease Maternal Grandfather    Heart disease Paternal Grandmother    Cancer Brother        bladder cancer    SOCIAL HISTORY: Social History   Tobacco Use   Smoking status: Former    Packs/day: 2.00    Years: 20.00    Additional pack years: 0.00    Total pack years: 40.00    Types: Cigarettes    Quit date: 05/22/1973    Years since quitting: 49.2   Smokeless tobacco: Current    Types: Chew   Tobacco comments:    35 years ago as of 2013  Vaping Use   Vaping Use: Never used  Substance Use Topics   Alcohol use: Not Currently   Drug use: No   Social History   Social History Narrative   Married, 1 son and 2 grandchildren.   Orig from [place] area, [employer].   Retired Electrical engineer (age 68).   [employer] ([place]  Belongs to [employer].            Objective:  Vital Signs:  BP 110/73   Pulse 74   Ht 5\' 7"  (1.702 m)   Wt 264 lb (119.7 kg)   SpO2 97%   BMI 41.35 kg/m   General: No acute distress.  Patient appears well-groomed.   Head:  Normocephalic/atraumatic Neck: supple Heart:  Regular rate and rhythm, carotid bruits Lungs:  Clear to auscultation bilaterally Neurological Exam: alert and oriented.  Speech fluent and not dysarthric, language intact.  CN II-XII intact. Bulk and tone normal, muscle strength 5/5 throughout.  Sensation to light touch intact.  Deep tendon reflexes 2+ throughout.  Finger to nose testing intact.  Gait normal.   Labs and Imaging  review: New results:  TSH (05/23/22): 1.33  Ziopatch (06/01/22): Sinus rhythm with heart rate of 79 bpm   Rare atrial tachycardia runs   Rare PAC, occasional PVC's   Brief short episodes (4 in total) of non sustained ventricular tachycardia. Normal EF on ECHO 04/2022   No atrial fibrillation     Patch Wear Time:  7 days and 1 hours (2023-12-29T08:41:35-0500 to 2024-01-05T09:49:20-0500)   Patient had a min HR of 45 bpm, max HR of 255 bpm, and avg HR of 79 bpm. Predominant underlying rhythm was Sinus Rhythm. 4 Ventricular Tachycardia runs occurred, the run with the fastest interval lasting 13 beats with a max rate of 255 bpm (avg 231 bpm);  the run with the fastest interval was also the longest. 22 Supraventricular Tachycardia runs occurred, the run with the fastest interval lasting 5 beats with a max rate of 197 bpm, the longest lasting 11.5 secs with an avg rate of 122 bpm. Isolated SVEs  were rare (<1.0%), SVE Couplets were rare (<1.0%), and SVE Triplets were rare (<1.0%). Isolated VEs were occasional (2.5%, 19614), VE Couplets were rare (<1.0%, 161), and VE Triplets were rare (<1.0%, 3). Ventricular Bigeminy and Trigeminy were present.  Previously reviewed results: Recent Labs       Lab Results  Component Value Date    HGBA1C 5.6 04/22/2022       Recent Labs       Lab Results  Component Value Date    TSH 1.40 07/05/2015     Lipid panel (04/22/22): Component     Latest Ref Rng 04/22/2022  Cholesterol     0 - 200 mg/dL 118   Triglycerides     <150 mg/dL 74   HDL Cholesterol     >40 mg/dL 33 (L)   Total CHOL/HDL Ratio     RATIO 3.6   VLDL     0 - 40 mg/dL 15   LDL (calc)     0 - 99 mg/dL 70       Imaging: CT head wo contrast (04/21/22): FINDINGS: Brain: There is no mass, hemorrhage or extra-axial collection. The size and configuration of the ventricles and extra-axial CSF spaces are normal. The brain parenchyma is normal, without evidence of acute or chronic  infarction.   Vascular: No abnormal hyperdensity of the major intracranial arteries or dural venous sinuses. No intracranial atherosclerosis.   Skull: The visualized skull base, calvarium and extracranial soft tissues are normal.   Sinuses/Orbits: No fluid levels or advanced mucosal thickening of the visualized paranasal sinuses. No mastoid or middle ear effusion. The orbits are normal.   ASPECTS [facility] Stroke Program Early CT Score)   - Ganglionic level infarction (caudate, lentiform nuclei, internal capsule, insula, M1-M3 cortex): 7   - Supraganglionic infarction (M4-M6 cortex): 3   Total score (0-10 with 10 being normal): 10   IMPRESSION: 1. No acute intracranial abnormality. 2. ASPECTS is 10.   CTA head and neck/CTP (04/21/22): FINDINGS: CTA NECK FINDINGS   SKELETON: There is no bony spinal canal stenosis. No lytic or blastic lesion.   OTHER NECK: Normal pharynx, larynx and major salivary glands. No cervical lymphadenopathy. Unremarkable thyroid gland.   UPPER CHEST: No pneumothorax or pleural effusion. No nodules or masses.   AORTIC ARCH:   There is no calcific atherosclerosis of the aortic arch. There is no aneurysm, dissection or hemodynamically significant stenosis of the visualized portion of the aorta. Conventional 3 vessel aortic branching pattern. The visualized proximal subclavian arteries are widely patent.   RIGHT  CAROTID SYSTEM: Normal without aneurysm, dissection or stenosis.   LEFT CAROTID SYSTEM: Normal without aneurysm, dissection or stenosis.   VERTEBRAL ARTERIES: Left dominant configuration. Both origins are clearly patent. There is no dissection, occlusion or flow-limiting stenosis to the skull base (V1-V3 segments).   CTA HEAD FINDINGS   POSTERIOR CIRCULATION:   --Vertebral arteries: Normal V4 segments.   --Inferior cerebellar arteries: Normal.   --Basilar artery: Normal.   --Superior cerebellar arteries: Normal.    --Posterior cerebral arteries (PCA): Normal.   ANTERIOR CIRCULATION:   --Intracranial internal carotid arteries: Normal.   --Anterior cerebral arteries (ACA): Normal. Both A1 segments are present. Patent anterior communicating artery (a-comm).   --Middle cerebral arteries (MCA): Normal.   VENOUS SINUSES: As permitted by contrast timing, patent.   ANATOMIC VARIANTS: None   Review of the MIP images confirms the above findings.   Right-sided pansinusitis.   CT Brain Perfusion Findings:   ASPECTS: 10   CBF (<30%) Volume: 42mL   Perfusion (Tmax>6.0s) volume: 67mL   Mismatch Volume: 33mL   Infarction Location:Area of ischemia corresponds to a superficial watershed pattern at the right ACA/MCA and MCA/PCA junctions.   IMPRESSION: 1. No emergent large vessel occlusion or high-grade stenosis of the intracranial or cervical arteries. 2. Small area of ischemia corresponds to a superficial watershed pattern at the right ACA/MCA and MCA/PCA junctions.   MRI brain wo contrast (04/22/22): FINDINGS: Brain: No acute infarction, hemorrhage, hydrocephalus, extra-axial collection or mass lesion. Mild chronic small vessel ischemia in the deep cerebral white matter and pons.   Vascular: Normal flow voids.   Skull and upper cervical spine: Normal marrow signal   Sinuses/Orbits: Opacification of right-sided maxillary, ethmoid, sphenoid, and frontal sinuses due to nasal cavity obstruction.   IMPRESSION: 1. No acute finding including infarct. 2. Chronic small vessel ischemia in the cerebral white matter. 3. Active sinusitis with diffuse obstruction of right-sided air cells, new from 2019.   MRI brain wo contrast (04/30/22): FINDINGS: Brain: No acute infarction, hemorrhage, hydrocephalus, extra-axial collection or mass lesion. Findings of severe chronic microvascular ischemic change, unchanged from prior exam. Cavum septum pellucidum.   Vascular: Normal flow voids.   Skull and  upper cervical spine: Normal marrow signal.   Sinuses/Orbits: Redemonstrated extensive OMC pattern polypoid mucosal thickening in the right frontal, ethmoid, and maxillary sinuses.   Other: None.   IMPRESSION: 1. No acute intracranial process. 2. Stable severe chronic microvascular ischemic change. 3. Redemonstrated extensive OMC pattern polypoid mucosal thickening in the right frontal, ethmoid, and maxillary sinuses.   Echo (04/22/22): IMPRESSIONS   1. Left ventricular ejection fraction, by estimation, is 65 to 70%. The  left ventricle has normal function. The left ventricle has no regional  wall motion abnormalities. Left ventricular diastolic parameters are  consistent with Grade I diastolic dysfunction (impaired relaxation).   2. Right ventricular systolic function is normal. The right ventricular  size is normal.   3. The mitral valve is normal in structure. No evidence of mitral valve  regurgitation. No evidence of mitral stenosis.   4. The aortic valve is normal in structure. Aortic valve regurgitation is  mild. No aortic stenosis is present.   5. Pulmonic valve regurgitation is moderate.   6. The inferior vena cava is normal in size with greater than 50%  respiratory variability, suggesting right atrial pressure of 3 mmHg.   Conclusion(s)/Recommendation(s): No intracardiac source of embolism  detected on this transthoracic study. Consider a transesophageal  echocardiogram to exclude cardiac source of embolism if  clinically  indicated.   Assessment/Plan:  This is Derrick [name], a 72 y.o. male with transient episodes of left sided numbness, likely TIA. Available diagnostic data is significant for MRI brain x 2 showing no acute infarct but severe chronic microvascular ischemia. Vessel imaging with CTA head and neck did not show significant stenosis. His symptoms are likely related to small vessel disease from HTN, HLD. He has had no further episodes since 05/2022, now s/p 3  weeks of DAPT.   Plan: -Continue aspirin 81 mg daily -Continue atorvastatin 80 mg daily -Discussed stroke warning signs and when to seek emergency medical attention  Return to clinic in 6 months  Kai Levins, MD

## 2022-08-18 ENCOUNTER — Ambulatory Visit: Payer: Medicare Other | Attending: Internal Medicine

## 2022-08-18 DIAGNOSIS — Z01812 Encounter for preprocedural laboratory examination: Secondary | ICD-10-CM

## 2022-08-18 DIAGNOSIS — I4729 Other ventricular tachycardia: Secondary | ICD-10-CM | POA: Diagnosis not present

## 2022-08-18 DIAGNOSIS — G459 Transient cerebral ischemic attack, unspecified: Secondary | ICD-10-CM

## 2022-08-18 DIAGNOSIS — G4730 Sleep apnea, unspecified: Secondary | ICD-10-CM

## 2022-08-18 DIAGNOSIS — I10 Essential (primary) hypertension: Secondary | ICD-10-CM | POA: Diagnosis not present

## 2022-08-18 DIAGNOSIS — Z79899 Other long term (current) drug therapy: Secondary | ICD-10-CM

## 2022-08-19 LAB — CBC
Hematocrit: 53.3 % — ABNORMAL HIGH (ref 37.5–51.0)
Hemoglobin: 18 g/dL — ABNORMAL HIGH (ref 13.0–17.7)
MCH: 29.8 pg (ref 26.6–33.0)
MCHC: 33.8 g/dL (ref 31.5–35.7)
MCV: 88 fL (ref 79–97)
Platelets: 259 10*3/uL (ref 150–450)
RBC: 6.04 x10E6/uL — ABNORMAL HIGH (ref 4.14–5.80)
RDW: 14.5 % (ref 11.6–15.4)
WBC: 10.6 10*3/uL (ref 3.4–10.8)

## 2022-08-19 LAB — BASIC METABOLIC PANEL
BUN/Creatinine Ratio: 19 (ref 10–24)
BUN: 21 mg/dL (ref 8–27)
CO2: 25 mmol/L (ref 20–29)
Calcium: 9.1 mg/dL (ref 8.6–10.2)
Chloride: 99 mmol/L (ref 96–106)
Creatinine, Ser: 1.09 mg/dL (ref 0.76–1.27)
Glucose: 90 mg/dL (ref 70–99)
Potassium: 4.5 mmol/L (ref 3.5–5.2)
Sodium: 139 mmol/L (ref 134–144)
eGFR: 72 mL/min/{1.73_m2} (ref >59)

## 2022-08-21 ENCOUNTER — Other Ambulatory Visit: Payer: Self-pay | Admitting: Family Medicine

## 2022-08-23 ENCOUNTER — Ambulatory Visit (INDEPENDENT_AMBULATORY_CARE_PROVIDER_SITE_OTHER): Payer: Medicare Other | Admitting: Neurology

## 2022-08-23 ENCOUNTER — Encounter: Payer: Self-pay | Admitting: Neurology

## 2022-08-23 ENCOUNTER — Telehealth: Payer: Self-pay | Admitting: Cardiology

## 2022-08-23 VITALS — BP 110/73 | HR 74 | Ht 67.0 in | Wt 264.0 lb

## 2022-08-23 DIAGNOSIS — E782 Mixed hyperlipidemia: Secondary | ICD-10-CM | POA: Diagnosis not present

## 2022-08-23 DIAGNOSIS — I161 Hypertensive emergency: Secondary | ICD-10-CM

## 2022-08-23 DIAGNOSIS — I10 Essential (primary) hypertension: Secondary | ICD-10-CM

## 2022-08-23 DIAGNOSIS — G459 Transient cerebral ischemic attack, unspecified: Secondary | ICD-10-CM

## 2022-08-23 NOTE — Telephone Encounter (Signed)
Spoke  with patient to advise that Dr Radford Pax is out of the office today but I would forward his message Dr Theodosia Blender nurse to follow up with him on 08/24/2022. Patient verbalized understanding and was appreciative of the call.

## 2022-08-23 NOTE — Telephone Encounter (Signed)
Spoke to patient, who is picking up a home sleep test on Friday 08/25/22. He states he has chronic sinus congestion and has been advised to have a surgery to improve this. He says he has had this surgery  before and it has been of limited help so he's not sure he wants to have it again but as a result of poor sinus drainage he mostly breathes through his mouth at night. He is asking if he can still do the home sleep  test even if he mostly breathes through his mouth at night. I advised him that he can still do the test, patient verbalizes understanding to proceed with home sleep test.

## 2022-08-23 NOTE — Telephone Encounter (Signed)
Patient calling back to see if there is an update. Please advise

## 2022-08-23 NOTE — Patient Instructions (Signed)
Plan: -Continue aspirin 81 mg daily -Continue atorvastatin 80 mg daily -Discussed stroke warning signs and when to seek emergency medical attention including vision loss, numbness or weakness on one side of the body, face droop, difficulty speaking or understanding speech, and dizziness, imbalance.  I would like to check in again with you in clinic in about 6 months or sooner if needed. Please let me know if you have any questions or concerns in the meantime.   The physicians and staff at [facility] are committed to providing excellent care. You may receive a survey requesting feedback about your experience at our office. We strive to receive "very good" responses to the survey questions. If you feel that your experience would prevent you from giving the office a "very good " response, please contact our office to try to remedy the situation. We may be reached at [phone]. Thank you for taking the time out of your busy day to complete the survey.  Kai Levins, MD [facility]

## 2022-08-23 NOTE — Telephone Encounter (Signed)
Patient stated his nasal surgery was postponed and he stated his nose gets stopped up at night.  Patient stated he is scheduled to get sleep equipment on 4/5.  Patient is concerned he still needs to get surgery in his nasal cavity which he believes may resolve his issue.  Patient wants a call back to discuss this.

## 2022-08-25 ENCOUNTER — Ambulatory Visit (HOSPITAL_BASED_OUTPATIENT_CLINIC_OR_DEPARTMENT_OTHER): Payer: Medicare Other | Attending: Internal Medicine | Admitting: Cardiology

## 2022-08-25 DIAGNOSIS — G4733 Obstructive sleep apnea (adult) (pediatric): Secondary | ICD-10-CM

## 2022-08-25 DIAGNOSIS — G4736 Sleep related hypoventilation in conditions classified elsewhere: Secondary | ICD-10-CM | POA: Diagnosis not present

## 2022-08-25 DIAGNOSIS — G4730 Sleep apnea, unspecified: Secondary | ICD-10-CM

## 2022-08-29 NOTE — Procedures (Signed)
   Patient Name: Derrick [name], Derrick [name] Date: 08/25/2022 Gender: Male D.O.B: 07/17/1950 Age (years): 71 Referring Provider: Sherryl Manges Height (inches): 67 Interpreting Physician: Armanda Magic MD, ABSM Weight (lbs): 263 RPSGT: [place] BMI: 41 MRN: [id number] Neck Size: 18.00  CLINICAL INFORMATION Sleep Study Type: HST  Indication for sleep study: N/A  Epworth Sleepiness Score: 6  SLEEP STUDY TECHNIQUE A multi-channel overnight portable sleep study was performed. The channels recorded were: nasal airflow, thoracic respiratory movement, and oxygen saturation with a pulse oximetry. Snoring was also monitored.  MEDICATIONS Patient self administered medications include: N/A.  SLEEP ARCHITECTURE Patient was studied for 364.4 minutes. The sleep efficiency was 100.0 % and the patient was supine for 0%. The arousal index was 0.0 per hour.  RESPIRATORY PARAMETERS The overall AHI was 61.3 per hour, with a central apnea index of 0 per hour.  The oxygen nadir was 78% during sleep.  CARDIAC DATA Mean heart rate during sleep was 67.9 bpm.  IMPRESSIONS - Severe obstructive sleep apnea occurred during this study (AHI = 61.3/h). - Severe oxygen desaturation was noted during this study (Min O2 = 78%). - Patient snored 15.1% during the sleep.  DIAGNOSIS - Obstructive Sleep Apnea (G47.33) - Nocturnal Hypoxemia (G47.36)  RECOMMENDATIONS - Recommend in lab CPAP titration.  - Avoid alcohol, sedatives and other CNS depressants that may worsen sleep apnea and disrupt normal sleep architecture. - Sleep hygiene should be reviewed to assess factors that may improve sleep quality. - Weight management and regular exercise should be initiated or continued.  [Electronically signed] 08/29/2022 09:16 AM  Armanda Magic MD, ABSM Diplomate, American Board of Sleep Medicine

## 2022-08-30 ENCOUNTER — Ambulatory Visit: Payer: Medicare Other | Admitting: Family Medicine

## 2022-09-05 ENCOUNTER — Encounter: Payer: Self-pay | Admitting: Family Medicine

## 2022-09-05 ENCOUNTER — Ambulatory Visit (INDEPENDENT_AMBULATORY_CARE_PROVIDER_SITE_OTHER): Payer: Medicare Other | Admitting: Family Medicine

## 2022-09-05 VITALS — BP 117/72 | HR 71 | Temp 98.0°F | Ht 67.0 in | Wt 263.8 lb

## 2022-09-05 DIAGNOSIS — Z8673 Personal history of transient ischemic attack (TIA), and cerebral infarction without residual deficits: Secondary | ICD-10-CM | POA: Diagnosis not present

## 2022-09-05 DIAGNOSIS — N529 Male erectile dysfunction, unspecified: Secondary | ICD-10-CM

## 2022-09-05 DIAGNOSIS — G4733 Obstructive sleep apnea (adult) (pediatric): Secondary | ICD-10-CM | POA: Diagnosis not present

## 2022-09-05 DIAGNOSIS — I10 Essential (primary) hypertension: Secondary | ICD-10-CM

## 2022-09-05 DIAGNOSIS — D751 Secondary polycythemia: Secondary | ICD-10-CM | POA: Diagnosis not present

## 2022-09-05 DIAGNOSIS — I5032 Chronic diastolic (congestive) heart failure: Secondary | ICD-10-CM | POA: Diagnosis not present

## 2022-09-05 MED ORDER — TADALAFIL 10 MG PO TABS: ORAL_TABLET | ORAL | 1 refills | Status: AC

## 2022-09-05 NOTE — Progress Notes (Signed)
OFFICE VISIT  09/05/2022  CC:  Chief Complaint  Patient presents with   Medical Management of Chronic Issues    1 month follow up; pt is not fasting    Patient is a 72 y.o. male who presents for 1 month follow-up hypertension, diastolic heart failure, NSVT, history of TIA, and erythrocytosis. A/P as of last visit: "#1 hypertension.  Blood pressure little bit up here and at recent cardiology visit.  However blood pressures at home are consistently normal.  He has been under more stress and anxiety lately due to some family being in town as well as his recent cardiology visit causing some stress. Changes as per Dr. Graciela Husbands yesterday-> continue Toprol-XL 100 mg a day, losartan/HCTZ 100/12.5 daily.   #2 diastolic dysfunction, volume overload. Dr. Graciela Husbands has started him on every other day Lasix for 4 doses and he also switched his antihypertensive as noted #1 above. He will need repeat electrolytes and creatinine in about 10 days.  Dr. Graciela Husbands has ordered CBC and bmet--> future.   #3 NSVT. Dr. Graciela Husbands following/initiated further w/u---> cardiac MRI.   #4 Hx TIA. Dr. Graciela Husbands changed his ASA to plavix qd. Also plans loop recorder implantation.   #5.  Erythrocytosis, chronic. Dr. Graciela Husbands is ordering sleep study."  INTERIM HX: Derrick [name] feels well. Since I last saw him he had a sleep study that showed sleep apnea and nocturnal hypoxemia. CPAP titration was recommended.  His Zio patch (7d) showed mostly sinus rhythm, rare atrial tachycardia runs, rare PAC, occasional PVCs, and brief short episodes of nonsustained ventricular tachycardia.  No atrial fibrillation.  cardiac MRI is scheduled for 10/19/2022.  He also saw his neurologist on 08/23/2022 and he was continued on aspirin 81 mg a day and atorvastatin 80 mg a day.  Has erectile dysfunction.  Asks if he can take ED medication.  ROS--> no fevers, no CP, no SOB, no wheezing, no cough, no dizziness, no HAs, no rashes, no melena/hematochezia.  No polyuria  or polydipsia.  No myalgias or arthralgias.  No focal weakness, paresthesias, or tremors.  No acute vision or hearing abnormalities.  No dysuria or unusual/new urinary urgency or frequency.  No recent changes in lower legs. No n/v/d or abd pain.  No palpitations.    Past Medical History:  Diagnosis Date   BPH with obstruction/lower urinary tract symptoms    +Hx of acute urinary retention/acute cystitis: responding fairly well to tamsulosin and finasteride as of 09/2016 urol f/u.  Urolift and bladd bx 02/2018-->doing great as of 07/2019 urol f/u.   Deafness in right ear    Diastolic dysfunction    Erythrocytosis    stable dating back to 2009 (17-18 range). Iron normal. No s/s of OSA.  Epo normal.  Just following as long as stable as of 10/2020 (no hem/onc ref)   Gross hematuria    hematuria protocol w/u being done by urol as of their 01/23/18 note.   History of back injury    Back fracture fell from tree   History of erectile dysfunction    History of fracture of right ankle 05/2015   History of kidney stones    History of staph infection 10 yrs ago   after back surgery   HTN (hypertension)    Hyperlipidemia    Low back pain    Microhematuria    chronic; pt gets periodic cystoscopy for monitoring, most recent 07/30/20-->bx showed benign urothelium and submucosa, with eosinophilic cystitis.   Nephrolithiasis 2015   CT showed 2cm  left sided stone which was stable since 2006 imaging and likely in renal parenchyma.  2.5 cm as of 08/2017 neph f/u: renal u/s 12/2017-->no hydro. KUB showed stable L sided 18 mm stone. Cystoscopy 12/2017 erythematous mucosa at dome, no mass.    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  Annotation: previous methicillin sensitive  staphylococcus aureus 2006 Qualifier: Diagnosis of  By: Ninetta Lights MD, Tinnie Gens  (ID Specialist)     Prostate cancer screening    via Urol--> PSA 1.39 April 2019. 1.36 Feb  2020.   TIA (transient ischemic attack)    03/2022   Tinnitus, left ear     Past Surgical History:  Procedure Laterality Date   COLONOSCOPY  10/12/2015   repeat 5 years   COLONOSCOPY W/ POLYPECTOMY  2007   Negative 2012 and 2017; Dr Gita Kudo 2022.   CYSTOSCOPY     with bladder biopsy:  dome of bladder with an area of chronic inflammation.  No malignancy.   CYSTOSCOPY WITH BIOPSY N/A 02/19/2018   Procedure: CYSTOSCOPY WITH BIOPSY/ FULGURATION / UROLIFT;  Surgeon: Jerilee Field, MD;  Location: [facility];  Service: Urology;  Laterality: N/A;   CYSTOSCOPY WITH FULGERATION N/A 07/30/2020   Bx benign. Procedure: CYSTOSCOPY WITH BLADDER BIOPSY/FULGERATION;  Surgeon: Jerilee Field, MD;  Location: [facility];  Service: Urology;  Laterality: N/A;   CYSTOSCOPY WITH INSERTION OF UROLIFT  02/19/2018   LUMBAR LAMINECTOMY     Dr Venetia Maxon performed 2nd & 3rd procedures (pt also has hx of fall and vertebral fracture years prior to his surgeries.   TONSILLECTOMY  as child   TRANSTHORACIC ECHOCARDIOGRAM     03/2022 EF normal, grd I DD, mild AI, mod Pulm insuff    Outpatient Medications Prior to Visit  Medication Sig Dispense Refill   Ascorbic Acid (VITAMIN C) 1000 MG tablet Take 1,000 mg by mouth at bedtime.     aspirin EC 81 MG tablet Take 1 tablet (81 mg total) by mouth daily. Swallow whole. 30 tablet 11   atorvastatin (LIPITOR) 80 MG tablet Take 1 tablet (80 mg total) by mouth daily. 90 tablet 3   cloNIDine (CATAPRES) 0.1 MG tablet Take 1 tablet (0.1 mg total) by mouth daily as needed (Take once if systolic blood pressure over 170). 30 tablet 0   finasteride (PROSCAR) 5 MG tablet TAKE 1 TABLET BY MOUTH EVERYDAY AT BEDTIME 90 tablet 1   fluticasone (FLONASE) 50 MCG/ACT nasal spray Place 1 spray into both nostrils 2 (two) times daily.     furosemide (LASIX) 20 MG tablet Take 1 tablet by mouth every other day x 4 doses. Then stop. 15 tablet 0   losartan-hydrochlorothiazide  (HYZAAR) 100-12.5 MG tablet Take 1 tablet by mouth daily. 90 tablet 3   metoprolol succinate (TOPROL-XL) 100 MG 24 hr tablet Take 1 tablet (100 mg total) by mouth daily. Take with or immediately following a meal. 90 tablet 3   pantoprazole (PROTONIX) 40 MG tablet Take 1 tablet (40 mg total) by mouth daily. 90 tablet 1   tamsulosin (FLOMAX) 0.4 MG CAPS capsule TAKE 1 CAPSULE BY MOUTH EVERY DAY 90 capsule 1   No facility-administered medications prior to visit.    No Known Allergies  Review of Systems As per HPI  PE:    09/05/2022   10:34 AM 08/23/2022    8:45 AM 08/01/2022    9:32 AM  Vitals with BMI  Height     Weight 263 lbs 13 oz 264 lbs 265 lbs 10 oz  BMI 41.31 41.34 41.59  Systolic 117 110 865  Diastolic 72 73 75  Pulse 71 74 76     Physical Exam  Gen: Alert, well appearing.  Patient is oriented to person, place, time, and situation. AFFECT: pleasant, lucid thought and speech. No further exam today  LABS:  Last CBC Lab Results  Component Value Date   WBC 10.6 08/18/2022   HGB 18.0 (H) 08/18/2022   HCT 53.3 (H) 08/18/2022   MCV 88 08/18/2022   MCH 29.8 08/18/2022   RDW 14.5 08/18/2022   PLT 259 08/18/2022   Lab Results  Component Value Date   IRON 111 11/10/2020   TIBC 280 11/10/2020   FERRITIN 180 11/10/2020   Last metabolic panel Lab Results  Component Value Date   GLUCOSE 90 08/18/2022   NA 139 08/18/2022   K 4.5 08/18/2022   CL 99 08/18/2022   CO2 25 08/18/2022   BUN 21 08/18/2022   CREATININE 1.09 08/18/2022   EGFR 72 08/18/2022   CALCIUM 9.1 08/18/2022   PHOS 2.6 06/19/2012   PROT 7.2 04/21/2022   ALBUMIN 3.7 04/21/2022   BILITOT 0.8 04/21/2022   ALKPHOS 71 04/21/2022   AST 21 04/21/2022   ALT 21 04/21/2022   ANIONGAP 10 04/30/2022   Last lipids Lab Results  Component Value Date   CHOL 118 04/22/2022   HDL 33 (L) 04/22/2022   LDLCALC 70 04/22/2022   LDLDIRECT 133.0 06/26/2016   TRIG 74 04/22/2022   CHOLHDL 3.6  04/22/2022   Last hemoglobin A1c Lab Results  Component Value Date   HGBA1C 5.6 04/22/2022   Last thyroid functions Lab Results  Component Value Date   TSH 1.33 05/23/2022   IMPRESSION AND PLAN:  #1 hypertension, well-controlled. continue Toprol-XL 100 mg a day, losartan/HCTZ 100/12.5 daily. Electrolytes and creatinine normal 2 weeks ago.  2.  Diastolic heart failure. Appears euvolemic. Continue Toprol-XL 100 mg a day and Hyzaar 100/12.5 daily.  Not requiring any diuretic.  3.  NSVT.  Recent Zio patch monitoring reassuring. Followed by Dr. Graciela Husbands.  #4 erythrocytosis. Recent sleep study confirmed obstructive sleep apnea with nocturnal hypoxemia.  #5 obstructive sleep apnea. Encouraged patient to follow-up with cardiology about getting CPAP titration.  #6 history of TIA. Followed by neurology, continued on aspirin 81 mg a day and atorvastatin 80 mg a day.  #7 erectile dysfunction. Cialis trial: 10 mg tabs, 1-2 daily as needed.  An After Visit Summary was printed and given to the patient.  FOLLOW UP: No follow-ups on file.  Signed:  Santiago Bumpers, MD           09/05/2022

## 2022-09-08 ENCOUNTER — Telehealth: Payer: Self-pay | Admitting: *Deleted

## 2022-09-08 NOTE — Telephone Encounter (Signed)
The patient has been notified of the result and verbalized understanding.  All questions (if any) were answered. Latrelle Dodrill, CMA 09/08/2022 4:05 PM    Patient says he has spoken to a ENT doctor about his sinuses and he was suppose to have surgery awhile back. He wants to explore other options before he does more testing.

## 2022-09-08 NOTE — Telephone Encounter (Signed)
-----   Message from Gaynelle Cage, [place] sent at 08/29/2022  2:44 PM EDT -----  ----- Message ----- From: Quintella Reichert, MD Sent: 08/29/2022   9:17 AM EDT To: Cv Div Sleep Studies  Please let patient know that they have sleep apnea.  Recommend therapeutic CPAP titration for treatment of patient's sleep disordered breathing.  If unable to perform an in lab titration then initiate ResMed auto CPAP from 4 to 15cm H2O with heated humidity and mask of choice and overnight pulse ox on CPAP.

## 2022-09-11 DIAGNOSIS — I5030 Unspecified diastolic (congestive) heart failure: Secondary | ICD-10-CM | POA: Insufficient documentation

## 2022-09-12 ENCOUNTER — Ambulatory Visit: Payer: Medicare Other | Attending: Internal Medicine | Admitting: Internal Medicine

## 2022-09-12 ENCOUNTER — Telehealth: Payer: Self-pay | Admitting: Internal Medicine

## 2022-09-12 ENCOUNTER — Encounter: Payer: Self-pay | Admitting: Internal Medicine

## 2022-09-12 VITALS — BP 124/68 | HR 80 | Ht 68.0 in | Wt 266.4 lb

## 2022-09-12 DIAGNOSIS — I5032 Chronic diastolic (congestive) heart failure: Secondary | ICD-10-CM

## 2022-09-12 DIAGNOSIS — I4729 Other ventricular tachycardia: Secondary | ICD-10-CM | POA: Diagnosis not present

## 2022-09-12 DIAGNOSIS — G459 Transient cerebral ischemic attack, unspecified: Secondary | ICD-10-CM | POA: Insufficient documentation

## 2022-09-12 DIAGNOSIS — I10 Essential (primary) hypertension: Secondary | ICD-10-CM

## 2022-09-12 DIAGNOSIS — G4733 Obstructive sleep apnea (adult) (pediatric): Secondary | ICD-10-CM

## 2022-09-12 DIAGNOSIS — G4730 Sleep apnea, unspecified: Secondary | ICD-10-CM

## 2022-09-12 NOTE — Telephone Encounter (Signed)
Patient wife called stating they were just at the office, when they were here they forgot to ask how they go about getting the device for sleep apnea. Please advise.

## 2022-09-12 NOTE — Patient Instructions (Signed)
Medication Instructions:  Your physician recommends that you continue on your current medications as directed. Please refer to the Current Medication list given to you today.  *If you need a refill on your cardiac medications before your next appointment, please call your pharmacy*   Lab Work: None ordered.  If you have labs (blood work) drawn today and your tests are completely normal, you will receive your results only by: MyChart Message (if you have MyChart) OR A paper copy in the mail If you have any lab test that is abnormal or we need to change your treatment, we will call you to review the results.   Testing/Procedures: None ordered.    Follow-Up:  At [facility], you and your health needs are our priority.  As part of our continuing mission to provide you with exceptional heart care, we have created designated Provider Care Teams.  These Care Teams include your primary Cardiologist (physician) and Advanced Practice Providers (APPs -  Physician Assistants and Nurse Practitioners) who all work together to provide you with the care you need, when you need it.  We recommend signing up for the patient portal called "MyChart".  Sign up information is provided on this After Visit Summary.  MyChart is used to connect with patients for Virtual Visits (Telemedicine).  Patients are able to view lab/test results, encounter notes, upcoming appointments, etc.  Non-urgent messages can be sent to your provider as well.   To learn more about what you can do with MyChart, go to [email or web address].    Your next appointment:   Appointment with Dr Graciela Husbands as scheduled  Other Instructions    Implantable Loop Recorder Placement, Care After This sheet gives you information about how to care for yourself after your procedure. Your health care provider may also give you more specific instructions. If you have problems or questions, contact your health care provider. What can I expect  after the procedure? After the procedure, it is common to have: Soreness or discomfort near the incision. Some swelling or bruising near the incision.  Follow these instructions at home: Incision care  Monitor your cardiac device site for redness, swelling, and drainage. Call the device clinic at [phone] if you experience these symptoms or fever/chills.  Keep the large square bandage on your site until Saturday and then you may remove it yourself. Keep the steri-strips underneath in place. They will usually fall off on their own, or may be removed after 10 days  You may shower tomorrow with the large bandage in place. Dennie Bible dry.   Avoid lotions, ointments, or perfumes over your incision until it is well-healed.  Please do not submerge in water until your site is completely healed.   Your device is MRI compatible.   Remote monitoring is used to monitor your cardiac device from home. This monitoring is scheduled every month by our office. It allows [place] to keep an eye on the function of your device to ensure it is working properly.  If your wound site starts to bleed apply pressure.    For help with the monitor please call Medtronic Monitor Support Specialist directly at [phone].    If you have any questions/concerns please call the device clinic at [phone].  Activity  Return to your normal activities.  General instructions Follow instructions from your health care provider about how to manage your implantable loop recorder and transmit the information. Learn how to activate a recording if this is necessary for your type of device.  You may go through a metal detection gate, and you may let someone hold a metal detector over your chest. Show your ID card if needed. Do not have an MRI unless you check with your health care provider first. Take over-the-counter and prescription medicines only as told by your health care provider. Keep all follow-up visits as told by your  health care provider. This is important. Contact a health care provider if: You have redness, swelling, or pain around your incision. You have a fever. You have pain that is not relieved by your pain medicine. You have triggered your device because of fainting (syncope) or because of a heartbeat that feels like it is racing, slow, fluttering, or skipping (palpitations). Get help right away if you have: Chest pain. Difficulty breathing. Summary After the procedure, it is common to have soreness or discomfort near the incision. Change your dressing as told by your health care provider. Follow instructions from your health care provider about how to manage your implantable loop recorder and transmit the information. Keep all follow-up visits as told by your health care provider. This is important. This information is not intended to replace advice given to you by your health care provider. Make sure you discuss any questions you have with your health care provider. Document Released: 04/19/2015 Document Revised: 06/23/2017 Document Reviewed: 06/23/2017 Elsevier Patient Education  2020 [employer].

## 2022-09-12 NOTE — Progress Notes (Addendum)
Patient Care Team: Jeoffrey Massed, MD as PCP - General (Family Medicine) Jerilee Field, MD as Consulting Physician (Urology) Judi Saa, DO as Consulting Physician (Sports Medicine) Carman Ching, MD (Inactive) as Consulting Physician (Gastroenterology) Marlene Bast (Optometry) Skotnicki, Skeet Latch, DO as Consulting Physician (Otolaryngology) Antony Madura, MD as Consulting Physician (Neurology)   HPI  Derrick [name] is a 72 y.o. male seen today in anticipation of a loop recorder to be implanted for atrial arrhythmias and TIA.  He also has a history of nonsustained ventricular tachycardia wth cMRI scheduled   Denies chest pain, chronic mild shortness of breath.  No edema or nocturnal dyspnea no palpitations  Interval diagnosis of sleep apnea.  Also has anticipated need for sinus surgery   DATE TEST EF    12/23 Echo   65-70 %    5/24  cMRI         Date Cr K Hgb  12/23 0.92 4.5 17.5   3/24   1.09  4.5 18.0        Records and Results Reviewed   Past Medical History:  Diagnosis Date   BPH with obstruction/lower urinary tract symptoms    +Hx of acute urinary retention/acute cystitis: responding fairly well to tamsulosin and finasteride as of 09/2016 urol f/u.  Urolift and bladd bx 02/2018-->doing great as of 07/2019 urol f/u.   Deafness in right ear    Diastolic dysfunction    Erythrocytosis    stable dating back to 2009 (17-18 range). Iron normal. No s/s of OSA.  Epo normal.  Just following as long as stable as of 10/2020 (no hem/onc ref)   Gross hematuria    hematuria protocol w/u being done by urol as of their 01/23/18 note.   History of back injury    Back fracture fell from tree   History of erectile dysfunction    History of fracture of right ankle 05/2015   History of kidney stones    History of staph infection 10 yrs ago   after back surgery   HTN (hypertension)    Hyperlipidemia    Low back pain    Microhematuria    chronic; pt gets  periodic cystoscopy for monitoring, most recent 07/30/20-->bx showed benign urothelium and submucosa, with eosinophilic cystitis.   Nephrolithiasis 2015   CT showed 2cm left sided stone which was stable since 2006 imaging and likely in renal parenchyma.  2.5 cm as of 08/2017 neph f/u: renal u/s 12/2017-->no hydro. KUB showed stable L sided 18 mm stone. Cystoscopy 12/2017 erythematous mucosa at dome, no mass.    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  Annotation: previous methicillin sensitive  staphylococcus aureus 2006 Qualifier: Diagnosis of  By: Ninetta Lights MD, Tinnie Gens  (ID Specialist)     Prostate cancer screening    via Urol--> PSA 1.39 April 2019. 1.36 Feb 2020.   TIA (transient ischemic attack)    03/2022   Tinnitus, left ear     Past Surgical History:  Procedure Laterality Date   COLONOSCOPY  10/12/2015   repeat 5 years   COLONOSCOPY W/ POLYPECTOMY  2007   Negative 2012 and 2017; Dr Gita Kudo 2022.   CYSTOSCOPY     with bladder biopsy:  dome of bladder with an area of chronic inflammation.  No malignancy.   CYSTOSCOPY WITH BIOPSY N/A 02/19/2018   Procedure: CYSTOSCOPY WITH  BIOPSY/ FULGURATION / UROLIFT;  Surgeon: Jerilee Field, MD;  Location: [facility];  Service: Urology;  Laterality: N/A;   CYSTOSCOPY WITH FULGERATION N/A 07/30/2020   Bx benign. Procedure: CYSTOSCOPY WITH BLADDER BIOPSY/FULGERATION;  Surgeon: Jerilee Field, MD;  Location: [facility];  Service: Urology;  Laterality: N/A;   CYSTOSCOPY WITH INSERTION OF UROLIFT  02/19/2018   LUMBAR LAMINECTOMY     Dr Venetia Maxon performed 2nd & 3rd procedures (pt also has hx of fall and vertebral fracture years prior to his surgeries.   TONSILLECTOMY  as child   TRANSTHORACIC ECHOCARDIOGRAM     03/2022 EF normal, grd I DD, mild AI, mod Pulm insuff    Current Meds  Medication Sig   Ascorbic Acid (VITAMIN C) 1000 MG tablet Take 1,000  mg by mouth at bedtime.   aspirin EC 81 MG tablet Take 1 tablet (81 mg total) by mouth daily. Swallow whole.   atorvastatin (LIPITOR) 80 MG tablet Take 1 tablet (80 mg total) by mouth daily.   cloNIDine (CATAPRES) 0.1 MG tablet Take 1 tablet (0.1 mg total) by mouth daily as needed (Take once if systolic blood pressure over 170).   finasteride (PROSCAR) 5 MG tablet TAKE 1 TABLET BY MOUTH EVERYDAY AT BEDTIME   fluticasone (FLONASE) 50 MCG/ACT nasal spray Place 1 spray into both nostrils 2 (two) times daily.   furosemide (LASIX) 20 MG tablet Take 1 tablet by mouth every other day x 4 doses. Then stop.   losartan-hydrochlorothiazide (HYZAAR) 100-12.5 MG tablet Take 1 tablet by mouth daily.   metoprolol succinate (TOPROL-XL) 100 MG 24 hr tablet Take 1 tablet (100 mg total) by mouth daily. Take with or immediately following a meal.   pantoprazole (PROTONIX) 40 MG tablet Take 1 tablet (40 mg total) by mouth daily.   tadalafil (CIALIS) 10 MG tablet 1-2 tabs po qd prn.  Take approx 1 hour prior to intercourse   tamsulosin (FLOMAX) 0.4 MG CAPS capsule TAKE 1 CAPSULE BY MOUTH EVERY DAY    No Known Allergies    Review of Systems negative except from HPI and PMH  Physical Exam BP 124/68   Pulse 80   Ht 5\' 8"  (1.727 m)   Wt 266 lb 6.4 oz (120.8 kg)   SpO2 94%   BMI 40.51 kg/m  Well developed and well nourished in no acute distress HENT normal E scleral and icterus clear Neck Supple JVP flat; carotids brisk and full Clear to ausculation Regular rate and rhythm, no murmurs gallops or rub Soft with active bowel sounds No clubbing cyanosis  Edema Alert and oriented, grossly normal motor and sensory function Skin Warm and Dry  ECG   CrCl cannot be calculated (Patient's most recent lab result is older than the maximum 21 days allowed.).   Assessment and  Plan  Ventricular tachycardia-nonsustained   Tachycardia-nonsustained   TIA   Hypertension   Heart failure with preserved  ejection fraction   Sleep apnea   Morbid obesity   Erythrocytosis/polycythemia    With sleep apnea, I suspect that the erythrocytosis is secondary.  We will plan to reassess this in about 3 or 4 months following sleep apnea therapy and see if the upward trajectory of hemoglobin has changed.  If not he will need evaluation for polycythemia vera.  No interval neurological events.  Volume status is stable.  Pre op Dx Cryptogenic Stroke  Post op Dx Same  Procedure  Loop Recorder implantation  DESCRIPTION OF PROCEDURE:  Informed written consent was  obtained.   Time Out Completed with  [employer]    After routine prep and drape of the left parasternal area, a small incision was created. A Medtronic LINQ Reveal Loop Recorder  Serial Number  [id number] was inserted.    SteriStrip dressing was  applied.  The patient tolerated the procedure without apparent complication.  EBL < 10cc   Current medicines are reviewed at length with the patient today .  The patient does not.cryp  have concerns regarding medicines.

## 2022-09-13 NOTE — Telephone Encounter (Signed)
Patient says he has spoken to a ENT doctor about his sinuses and he was suppose to have surgery awhile back. He wants to explore other options before he does more testing.   Reached out to patient again and he ask if the sleep machine would work if he chose to get it and I replied I was not a doctor so he ask to have the doctor speak with him.

## 2022-09-13 NOTE — Telephone Encounter (Signed)
Attempted phone call to pt's wife, DPR.  OK per Epic to leave detailed message.  Advised pt should be hearing from our sleep team regarding next steps for obtaining CPAP equipment.  Pt may call [phone] for any further questions or concerns.

## 2022-09-14 NOTE — Addendum Note (Signed)
Addended by: Reesa Chew on: 09/14/2022 12:11 PM   Modules accepted: Orders

## 2022-09-14 NOTE — Telephone Encounter (Signed)
Patient called back to say yes he wants to proceed with the titration.

## 2022-09-15 ENCOUNTER — Telehealth: Payer: Self-pay

## 2022-09-15 NOTE — Telephone Encounter (Signed)
The patient wife states he has a rash on his chest. I asked her to take a picture and send it to my email address. She agreed.

## 2022-09-15 NOTE — Telephone Encounter (Signed)
Returned call to Pt's wife.  Advised ok to use a benadryl cream or hydrocortisone cream on rash.  Per wife, they have been using hydrocortisone cream and it helps with the itching for a little bit, but the rash has continued to get worse.  Advised to take an oral antihistamine.  Pt has benadryl.  They will start taking benadryl.  If no improvement in rash they will call device clinic on Monday.

## 2022-10-08 ENCOUNTER — Ambulatory Visit (HOSPITAL_BASED_OUTPATIENT_CLINIC_OR_DEPARTMENT_OTHER): Payer: Medicare Other | Attending: Cardiology | Admitting: Cardiology

## 2022-10-08 VITALS — Ht 67.0 in | Wt 255.0 lb

## 2022-10-08 DIAGNOSIS — G4733 Obstructive sleep apnea (adult) (pediatric): Secondary | ICD-10-CM | POA: Diagnosis not present

## 2022-10-08 DIAGNOSIS — I110 Hypertensive heart disease with heart failure: Secondary | ICD-10-CM | POA: Insufficient documentation

## 2022-10-08 DIAGNOSIS — I5032 Chronic diastolic (congestive) heart failure: Secondary | ICD-10-CM | POA: Insufficient documentation

## 2022-10-08 DIAGNOSIS — R0902 Hypoxemia: Secondary | ICD-10-CM | POA: Diagnosis not present

## 2022-10-09 ENCOUNTER — Ambulatory Visit: Payer: Medicare Other | Admitting: Internal Medicine

## 2022-10-17 ENCOUNTER — Ambulatory Visit (INDEPENDENT_AMBULATORY_CARE_PROVIDER_SITE_OTHER): Payer: Medicare Other

## 2022-10-17 DIAGNOSIS — G459 Transient cerebral ischemic attack, unspecified: Secondary | ICD-10-CM | POA: Diagnosis not present

## 2022-10-17 LAB — CUP PACEART REMOTE DEVICE CHECK
Date Time Interrogation Session: 20240528110314
Implantable Pulse Generator Implant Date: 20240423

## 2022-10-18 ENCOUNTER — Telehealth (HOSPITAL_COMMUNITY): Payer: Self-pay | Admitting: Emergency Medicine

## 2022-10-18 ENCOUNTER — Telehealth: Payer: Self-pay | Admitting: Internal Medicine

## 2022-10-18 NOTE — Telephone Encounter (Signed)
Spoke with pt who is scheduled for cardiac MRI and advised ILR is MRI compatible.  Reviewed loop recorder result report from 10/17/2022.  Pt verbalized understanding and thanked Charity fundraiser for the information.

## 2022-10-18 NOTE — Telephone Encounter (Signed)
Reaching out to patient to offer assistance regarding upcoming cardiac imaging study; pt verbalizes understanding of appt date/time, parking situation and where to check in, pre-test NPO status and medications ordered, and verified current allergies; name and call back number provided for further questions should they arise Derrick Alexandria RN Navigator Cardiac Imaging [name] [phone] office [phone] cell  Loop recorder Denies claustro

## 2022-10-18 NOTE — Telephone Encounter (Signed)
Pt would like a callback from nurses regarding Loop Recorder. Please advise

## 2022-10-19 ENCOUNTER — Other Ambulatory Visit: Payer: Self-pay | Admitting: Internal Medicine

## 2022-10-19 ENCOUNTER — Ambulatory Visit (HOSPITAL_COMMUNITY)
Admission: RE | Admit: 2022-10-19 | Discharge: 2022-10-19 | Disposition: A | Discharge status: Home / Self Care | Payer: Medicare Other | Source: Ambulatory Visit | Attending: Internal Medicine | Admitting: Internal Medicine

## 2022-10-19 DIAGNOSIS — I472 Ventricular tachycardia, unspecified: Secondary | ICD-10-CM | POA: Insufficient documentation

## 2022-10-19 MED ORDER — GADOBUTROL 1 MMOL/ML IV SOLN
10.0000 mL | Freq: Once | INTRAVENOUS | Status: AC | PRN
  Administered 2022-10-19: 10 mL via INTRAVENOUS

## 2022-11-04 ENCOUNTER — Other Ambulatory Visit: Payer: Self-pay | Admitting: Family Medicine

## 2022-11-06 ENCOUNTER — Telehealth: Payer: Self-pay

## 2022-11-06 NOTE — Procedures (Signed)
   Patient Name: Derrick [name], Derrick [name] Date: 10/08/2022 Gender: Male D.O.B: 1950/11/06 Age (years): 68 Referring Provider: Sherryl Manges Height (inches): 67 Interpreting Physician: Armanda Magic MD, ABSM Weight (lbs): 255 RPSGT: Armen Pickup BMI: 40 MRN: [id number] Neck Size: 19.00  CLINICAL INFORMATION The patient is referred for a BiPAP titration to treat sleep apnea.  SLEEP STUDY TECHNIQUE As per the AASM Manual for the Scoring of Sleep and Associated Events v2.3 (April 2016) with a hypopnea requiring 4% desaturations.  The channels recorded and monitored were frontal, central and occipital EEG, electrooculogram (EOG), submentalis EMG (chin), nasal and oral airflow, thoracic and abdominal wall motion, anterior tibialis EMG, snore microphone, electrocardiogram, and pulse oximetry. Bilevel positive airway pressure (BPAP) was initiated at the beginning of the study and titrated to treat sleep-disordered breathing.  MEDICATIONS Medications self-administered by patient taken the night of the study : N/A  RESPIRATORY PARAMETERS Optimal CPAP Pressure (cm):11  AHI at Optimal Pressure (/hr) 4.7 Overall Minimal O2 (%):82.0  Minimal O2 at Optimal Pressure (%): 85.0  SLEEP ARCHITECTURE Start Time:10:37:54 PM  Stop Time:5:32:30 AM  Total Time (min):414.6  Total Sleep Time (min):367 Sleep Latency (min):14.7  Sleep Efficiency (%):88.5%  REM Latency (min):39.5  WASO (min):32.9 Stage N1 (%): 2.7%  Stage N2 (%): 64.9%  Stage N3 (%): 0.0%  Stage R (%):32.4 Supine (%):40.74  Arousal Index (/hr):30.9   CARDIAC DATA The 2 lead EKG demonstrated sinus rhythm. The mean heart rate was 77.6 beats per minute. Other EKG findings include: PVCs.  LEG MOVEMENT DATA The total Periodic Limb Movements of Sleep (PLMS) were 0. The PLMS index was 0.0. A PLMS index of <15 is considered normal in adults.  IMPRESSIONS - An optimal PAP pressure was 11cm H2O based on the available study data. - Central  sleep apnea was not noted during this titration (CAI = 0.3/h). - Moderete oxygen desaturations were observed during this titration (min O2 = 82.0%). - No snoring was audible during this study. - PVC's were observed during this study. - Clinically significant periodic limb movements were not noted during this study. Arousals associated with PLMs were rare.  DIAGNOSIS - Obstructive Sleep Apnea (G47.33) - Nocturnal Hypoxemia  RECOMMENDATIONS - Recommend a trial of ResMed CPAP at 11cm H2O with heated humidity and mask of choice. - Avoid alcohol, sedatives and other CNS depressants that may worsen sleep apnea and disrupt normal sleep architecture. - Sleep hygiene should be reviewed to assess factors that may improve sleep quality. - Weight management and regular exercise should be initiated or continued. - Return to Sleep Center for re-evaluation after 4 weeks of therapy - Recommend ONO on CPAP.  [Electronically signed] 11/06/2022 09:44 AM  Armanda Magic MD, ABSM Diplomate, American Board of Sleep Medicine

## 2022-11-06 NOTE — Telephone Encounter (Signed)
Patient notified of Titration results and recommendations. Order for CPAP machine and supplies sent to [facility] 11/06/22.

## 2022-11-06 NOTE — Telephone Encounter (Signed)
-----   Message from Quintella Reichert, MD sent at 11/06/2022  9:46 AM EDT ----- Please let patient know that they had a successful PAP titration and let DME know that orders are in EPIC.  Please set up 6 week OV with me. Needs ONO on CPAP

## 2022-11-09 NOTE — Progress Notes (Signed)
Carelink Summary Report / Loop Recorder 

## 2022-11-15 ENCOUNTER — Ambulatory Visit: Payer: Medicare Other | Attending: Internal Medicine | Admitting: Internal Medicine

## 2022-11-15 ENCOUNTER — Encounter: Payer: Self-pay | Admitting: Internal Medicine

## 2022-11-15 VITALS — BP 110/80 | HR 75 | Ht 68.0 in | Wt 249.0 lb

## 2022-11-15 DIAGNOSIS — D8685 Sarcoid myocarditis: Secondary | ICD-10-CM | POA: Insufficient documentation

## 2022-11-15 DIAGNOSIS — I4729 Other ventricular tachycardia: Secondary | ICD-10-CM | POA: Insufficient documentation

## 2022-11-15 DIAGNOSIS — I5032 Chronic diastolic (congestive) heart failure: Secondary | ICD-10-CM | POA: Insufficient documentation

## 2022-11-15 DIAGNOSIS — R000 Tachycardia, unspecified: Secondary | ICD-10-CM | POA: Insufficient documentation

## 2022-11-15 NOTE — Patient Instructions (Addendum)
Medication Instructions:  Your physician recommends that you continue on your current medications as directed. Please refer to the Current Medication list given to you today.  *If you need a refill on your cardiac medications before your next appointment, please call your pharmacy*   Lab Work: None ordered.  If you have labs (blood work) drawn today and your tests are completely normal, you will receive your results only by: MyChart Message (if you have MyChart) OR A paper copy in the mail If you have any lab test that is abnormal or we need to change your treatment, we will call you to review the results.   Testing/Procedures:  PET scan as requested by Dr Graciela Husbands    Follow-Up: At [facility], you and your health needs are our priority.  As part of our continuing mission to provide you with exceptional heart care, we have created designated Provider Care Teams.  These Care Teams include your primary Cardiologist (physician) and Advanced Practice Providers (APPs -  Physician Assistants and Nurse Practitioners) who all work together to provide you with the care you need, when you need it.  We recommend signing up for the patient portal called "MyChart".  Sign up information is provided on this After Visit Summary.  MyChart is used to connect with patients for Virtual Visits (Telemedicine).  Patients are able to view lab/test results, encounter notes, upcoming appointments, etc.  Non-urgent messages can be sent to your provider as well.   To learn more about what you can do with MyChart, go to [email or web address].    Your next appointment:   12 months with Dr Graciela Husbands

## 2022-11-15 NOTE — Progress Notes (Signed)
Patient Care Team: Jeoffrey Massed, MD as PCP - General (Family Medicine) Jerilee Field, MD as Consulting Physician (Urology) Judi Saa, DO as Consulting Physician (Sports Medicine) Carman Ching, MD (Inactive) as Consulting Physician (Gastroenterology) Marlene Bast (Optometry) Skotnicki, Skeet Latch, DO as Consulting Physician (Otolaryngology) Antony Madura, MD as Consulting Physician (Neurology)   HPI  Derrick [name] is a 72 y.o. male seen today in anticipation of a loop recorder to be implanted for atrial arrhythmias and TIA.  He also has a history of nonsustained ventricular tachycardia.  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  There have been no palpitations, lightheadedness or syncope.  On  Loop recorder detected interval ventricular tachycardia  Interval diagnosis of sleep apnea.  Also has anticipated need for sinus surgery   DATE TEST EF    12/23 Echo   65-70 %    5/24  cMRI   56% Subendocardial LGE ? Sarcoid      Date Cr K Hgb  12/23 0.92 4.5 17.5   3/24   1.09  4.5 18.0        Records and Results Reviewed   Past Medical History:  Diagnosis Date   BPH with obstruction/lower urinary tract symptoms    +Hx of acute urinary retention/acute cystitis: responding fairly well to tamsulosin and finasteride as of 09/2016 urol f/u.  Urolift and bladd bx 02/2018-->doing great as of 07/2019 urol f/u.   Deafness in right ear    Diastolic dysfunction    Erythrocytosis    stable dating back to 2009 (17-18 range). Iron normal. No s/s of OSA.  Epo normal.  Just following as long as stable as of 10/2020 (no hem/onc ref)   Gross hematuria    hematuria protocol w/u being done by urol as of their 01/23/18 note.   History of back injury    Back fracture fell from tree   History of erectile dysfunction    History of fracture of right ankle 05/2015   History of kidney  stones    History of staph infection 10 yrs ago   after back surgery   HTN (hypertension)    Hyperlipidemia    Low back pain    Microhematuria    chronic; pt gets periodic cystoscopy for monitoring, most recent 07/30/20-->bx showed benign urothelium and submucosa, with eosinophilic cystitis.   Nephrolithiasis 2015   CT showed 2cm left sided stone which was stable since 2006 imaging and likely in renal parenchyma.  2.5 cm as of 08/2017 neph f/u: renal u/s 12/2017-->no hydro. KUB showed stable L sided 18 mm stone. Cystoscopy 12/2017 erythematous mucosa at dome, no mass.    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  Annotation: previous methicillin sensitive  staphylococcus aureus 2006 Qualifier: Diagnosis of  By: Ninetta Lights MD, Tinnie Gens  (ID Specialist)     Prostate cancer screening    via Urol--> PSA 1.39 April 2019. 1.36 Feb 2020.   TIA (transient ischemic attack)    03/2022   Tinnitus, left ear     Past Surgical History:  Procedure Laterality Date   COLONOSCOPY  10/12/2015   repeat 5 years   COLONOSCOPY W/ POLYPECTOMY  2007   Negative 2012 and 2017; Dr Gita Kudo 2022.   CYSTOSCOPY     with bladder biopsy:  dome of  bladder with an area of chronic inflammation.  No malignancy.   CYSTOSCOPY WITH BIOPSY N/A 02/19/2018   Procedure: CYSTOSCOPY WITH BIOPSY/ FULGURATION / UROLIFT;  Surgeon: Jerilee Field, MD;  Location: [facility];  Service: Urology;  Laterality: N/A;   CYSTOSCOPY WITH FULGERATION N/A 07/30/2020   Bx benign. Procedure: CYSTOSCOPY WITH BLADDER BIOPSY/FULGERATION;  Surgeon: Jerilee Field, MD;  [facility];  Service: Urology;  Laterality: N/A;   CYSTOSCOPY WITH INSERTION OF UROLIFT  02/19/2018   LUMBAR LAMINECTOMY     Dr Venetia Maxon performed 2nd & 3rd procedures (pt also has hx of fall and vertebral fracture years prior to his surgeries.   TONSILLECTOMY  as child    TRANSTHORACIC ECHOCARDIOGRAM     03/2022 EF normal, grd I DD, mild AI, mod Pulm insuff    Current Meds  Medication Sig   Ascorbic Acid (VITAMIN C) 1000 MG tablet Take 1,000 mg by mouth at bedtime.   aspirin EC 81 MG tablet Take 1 tablet (81 mg total) by mouth daily. Swallow whole.   atorvastatin (LIPITOR) 80 MG tablet Take 1 tablet (80 mg total) by mouth daily.   cloNIDine (CATAPRES) 0.1 MG tablet Take 1 tablet (0.1 mg total) by mouth daily as needed (Take once if systolic blood pressure over 170).   finasteride (PROSCAR) 5 MG tablet TAKE 1 TABLET BY MOUTH EVERYDAY AT BEDTIME   losartan-hydrochlorothiazide (HYZAAR) 100-12.5 MG tablet Take 1 tablet by mouth daily.   metoprolol succinate (TOPROL-XL) 100 MG 24 hr tablet Take 1 tablet (100 mg total) by mouth daily. Take with or immediately following a meal.   pantoprazole (PROTONIX) 40 MG tablet Take 1 tablet (40 mg total) by mouth daily.   tadalafil (CIALIS) 10 MG tablet 1-2 tabs po qd prn.  Take approx 1 hour prior to intercourse   tamsulosin (FLOMAX) 0.4 MG CAPS capsule TAKE 1 CAPSULE BY MOUTH EVERY DAY   [DISCONTINUED] fluticasone (FLONASE) 50 MCG/ACT nasal spray Place 1 spray into both nostrils 2 (two) times daily.   [DISCONTINUED] furosemide (LASIX) 20 MG tablet Take 1 tablet by mouth every other day x 4 doses. Then stop.    No Known Allergies    Review of Systems negative except from HPI and PMH  Physical Exam BP 110/80   Pulse 75   Ht 5\' 8"  (1.727 m)   Wt 249 lb (112.9 kg)   SpO2 94%   BMI 37.86 kg/m  Well developed and Morbidly obese in no acute distress HENT normal Neck supple with JVP-flat Clear Device pocket well healed; without hematoma or erythema.  There is no tethering  Regular rate and rhythm, no  gallop No  murmur Abd-soft with active BS No Clubbing cyanosis  edema Skin-warm and dry A & Oriented  Grossly normal sensory and motor function     ECG sinus at 75   15/09/41 otherwise normal  CrCl cannot be  calculated (Patient's most recent lab result is older than the maximum 21 days allowed.).   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  We have reviewed the implications of the different possible findings  Blood pressure is well-controlled so we will continue the metoprolol losartan HCT and  clonidine.  His sleep study was reviewed.  I had to reach out to Dr. Mayford Knife because I could not find the AHI on the report.  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  He was told he needed CPAP; he is also working on losing weight.  Stressed the importance of exercise in combination with his low-carb diet   With his sleep apnea,   Current medicines are reviewed at length with the patient today .  The patient does not.cryp  have concerns regarding medicines.

## 2022-11-20 ENCOUNTER — Ambulatory Visit: Payer: Medicare Other

## 2022-11-20 DIAGNOSIS — G459 Transient cerebral ischemic attack, unspecified: Secondary | ICD-10-CM | POA: Diagnosis not present

## 2022-11-20 LAB — CUP PACEART REMOTE DEVICE CHECK
Date Time Interrogation Session: 20240630110319
Implantable Pulse Generator Implant Date: 20240423

## 2022-11-29 NOTE — Telephone Encounter (Addendum)
 Fax came today for patient from [facility] stating the patient want to put his cpap on hold at this time and he will reach back out to them when he can move forward.

## 2022-12-06 NOTE — Progress Notes (Signed)
 Carelink Summary Report / Loop Recorder 

## 2022-12-15 ENCOUNTER — Encounter (HOSPITAL_COMMUNITY): Payer: Self-pay

## 2022-12-22 LAB — CUP PACEART REMOTE DEVICE CHECK
Date Time Interrogation Session: 20240802110424
Implantable Pulse Generator Implant Date: 20240423

## 2022-12-25 ENCOUNTER — Telehealth (HOSPITAL_COMMUNITY): Payer: Self-pay | Admitting: *Deleted

## 2022-12-25 ENCOUNTER — Ambulatory Visit: Payer: Medicare Other

## 2022-12-25 DIAGNOSIS — G459 Transient cerebral ischemic attack, unspecified: Secondary | ICD-10-CM | POA: Diagnosis not present

## 2022-12-25 NOTE — Telephone Encounter (Signed)
Reaching out to patient to offer assistance regarding upcoming cardiac imaging study; pt verbalizes understanding of appt date/time, parking situation and where to check in, pre-test NPO status; name and call back number provided for further questions should they arise  Larey Brick RN Navigator Cardiac Imaging [name] [phone] office [phone] cell  Patient verbalized understanding of diet prep for his cardiac Sarcoid study.

## 2022-12-27 ENCOUNTER — Encounter (HOSPITAL_COMMUNITY)
Admission: RE | Admit: 2022-12-27 | Discharge: 2022-12-27 | Disposition: A | Discharge status: Home / Self Care | Payer: Medicare Other | Source: Ambulatory Visit | Attending: Internal Medicine | Admitting: Internal Medicine

## 2022-12-27 DIAGNOSIS — I700 Atherosclerosis of aorta: Secondary | ICD-10-CM | POA: Diagnosis not present

## 2022-12-27 DIAGNOSIS — I2510 Atherosclerotic heart disease of native coronary artery without angina pectoris: Secondary | ICD-10-CM | POA: Diagnosis not present

## 2022-12-27 DIAGNOSIS — J9811 Atelectasis: Secondary | ICD-10-CM | POA: Diagnosis not present

## 2022-12-27 DIAGNOSIS — D8685 Sarcoid myocarditis: Secondary | ICD-10-CM | POA: Insufficient documentation

## 2022-12-27 LAB — NM PET CT MYOCARDIAL SARCOIDOSIS: Nuc Stress EF: 64 %

## 2022-12-27 MED ORDER — FLUDEOXYGLUCOSE F - 18 (FDG) INJECTION
9.4500 | Freq: Once | INTRAVENOUS | Status: AC | PRN
  Administered 2022-12-27: 9.45 via INTRAVENOUS

## 2022-12-27 MED ORDER — RUBIDIUM RB82 GENERATOR (RUBYFILL)
27.6000 | PACK | Freq: Once | INTRAVENOUS | Status: AC
  Administered 2022-12-27: 27.6 via INTRAVENOUS

## 2023-01-03 ENCOUNTER — Telehealth: Payer: Self-pay | Admitting: Internal Medicine

## 2023-01-03 NOTE — Telephone Encounter (Signed)
-----   Message from Sherryl Manges sent at 01/03/2023 12:26 PM EDT ----- Please Inform Patient that study was normal wtihout evidence of sarcoid   Thanks

## 2023-01-03 NOTE — Telephone Encounter (Signed)
Patient is calling about results to his PET scan.

## 2023-01-03 NOTE — Telephone Encounter (Signed)
Spoke with patient and informed him Dr. Graciela Husbands has not yet reviewed his cardiac PET results.  Informed patient we will contact him when Dr. Graciela Husbands has reviewed his results. Patient verbalized understanding and expressed appreciation for call.

## 2023-01-03 NOTE — Telephone Encounter (Signed)
The patient has been notified of the result and verbalized understanding.  All questions (if any) were answered. Frutoso Schatz, RN 01/03/2023 2:45 PM

## 2023-01-05 NOTE — Progress Notes (Signed)
Carelink Summary Report / Loop Recorder 

## 2023-01-24 LAB — CUP PACEART REMOTE DEVICE CHECK
Date Time Interrogation Session: 20240904110318
Implantable Pulse Generator Implant Date: 20240423

## 2023-01-29 ENCOUNTER — Ambulatory Visit (INDEPENDENT_AMBULATORY_CARE_PROVIDER_SITE_OTHER): Payer: Medicare Other

## 2023-01-29 DIAGNOSIS — G459 Transient cerebral ischemic attack, unspecified: Secondary | ICD-10-CM

## 2023-02-03 ENCOUNTER — Other Ambulatory Visit: Payer: Self-pay | Admitting: Family Medicine

## 2023-02-05 NOTE — Telephone Encounter (Signed)
RF request for finasteride (PROSCAR) 5 MG tablet  LOV: 09/05/22 Next ov: 03/07/23 Last written: 08/01/22 (90,1)  RF request for pantoprazole (PROTONIX) 40 MG tablet  LOV: 09/05/22 Next ov: 03/07/23 Last written: 08/01/22 (90,1)  30 d/s given until scheduled appt 10/16

## 2023-02-08 NOTE — Progress Notes (Signed)
NEUROLOGY FOLLOW UP OFFICE NOTE  Derrick [name] [id number]  Subjective:  Derrick [name] is a 72 y.o. 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  To briefly review: 05/23/22: Patient woke on 04/21/22 with left toes, fingers, and cheek being numb. It resolved after 20 minutes. He was driving later that day and noticed left fingers getting numb. He tried to open a bottle and was unable. He later had his hand go numb again and dropped a water bowl for his dogs. He was speaking to his wife and noticed to have slurred speech. Per patient, the words he said didn't make sense and seemed liked the words were slow. Per patient, his mother in law had a stroke earlier that day and maybe there was additional stress evolved. He was sent to the ED at this point. He was seen by teleneurology and NIHSS was 1 for mildly slurred speech. CTH showed no acute process (aspects 10). Given his mild symptoms, decided against IV TNKase. CTA head and neck showed no LVO or high grade stenosis. CT perfusion showed an area of ischemia corresponding to the watershed area of the right ACA/MCA and MCA/PCA junctions. MRI brain on 04/22/22 showed no acute infarct but chronic small vessel disease. Echo on 04/22/22 showed normal EF, normal LA size, no evidence of shunt. BP was elevated at the time of presentation and felt to be a factor in patient's symptoms. Patient was started on asa 81 mg daily, atorvastatin 80 mg daily and discharged on 04/22/22.   On 04/30/22 patient again had elevated SBP to 168 and had recurrence of symptoms with lip and tongue numbness. Patient had another MRI brain that was similar to MRI on 04/22/22 with no acute infarct.   Around 05/13/22 patient had acute onset of left lip and left hand numbness that lasted about 10 minutes. Per  patient, the first episode was very noticeable and the other two were minor and perhaps more concerning only because of the first episode. He has had no previous episodes since 05/13/22.   Patient is currently on asa 81 mg daily, plavix 75 mg daily, and atorvastatin 80 mg daily for secondary stroke prevention. He has been on plavix for about 1 week.  08/23/22: TSH was normal. Ziopatch showed no evidence of afib.   Patient has not further episodes since our last visit (05/23/22).   Current medications: Patient has finished plavix. He is currently on asa 81 mg daily and atorvastatin 80 mg daily. His metoprolol was increased to 100 mg daily.   He has no new complaints today.  Most recent Assessment and Plan (08/23/22): This is Derrick [name], a 72 y.o. male with transient episodes of left sided numbness, likely TIA. Available diagnostic data is significant for MRI brain x 2 showing no acute infarct but severe chronic microvascular ischemia. Vessel imaging with CTA head and neck did not show significant stenosis. His symptoms are likely related to small vessel disease from HTN, HLD. He has had no further episodes since 05/2022, now s/p 3 weeks of DAPT.    Plan: -Continue aspirin 81 mg daily -Continue atorvastatin 80 mg daily -Discussed stroke warning signs and when to seek emergency medical attention  Since their last visit: Patient has had no further episodes of numbness. He has lost about 18 lbs with a diet.  His blood pressure has been good.  Patient feels like he is doing well with no new complaints.  MEDICATIONS:  Outpatient Encounter Medications as of 02/09/2023  Medication Sig   Ascorbic Acid (VITAMIN C) 1000 MG tablet Take 1,000 mg by mouth at bedtime.   aspirin EC 81 MG tablet Take 1 tablet (81 mg total) by mouth daily. Swallow whole.   atorvastatin (LIPITOR) 80 MG tablet Take 1 tablet (80 mg total) by mouth daily.   cloNIDine (CATAPRES) 0.1 MG tablet Take 1 tablet (0.1 mg total) by  mouth daily as needed (Take once if systolic blood pressure over 170).   finasteride (PROSCAR) 5 MG tablet TAKE 1 TABLET BY MOUTH EVERYDAY AT BEDTIME   losartan-hydrochlorothiazide (HYZAAR) 100-12.5 MG tablet Take 1 tablet by mouth daily.   metoprolol succinate (TOPROL-XL) 100 MG 24 hr tablet Take 1 tablet (100 mg total) by mouth daily. Take with or immediately following a meal.   pantoprazole (PROTONIX) 40 MG tablet TAKE 1 TABLET BY MOUTH EVERY DAY   tadalafil (CIALIS) 10 MG tablet 1-2 tabs po qd prn.  Take approx 1 hour prior to intercourse   tamsulosin (FLOMAX) 0.4 MG CAPS capsule TAKE 1 CAPSULE BY MOUTH EVERY DAY   No facility-administered encounter medications on file as of 02/09/2023.    PAST MEDICAL HISTORY: Past Medical History:  Diagnosis Date   BPH with obstruction/lower urinary tract symptoms    +Hx of acute urinary retention/acute cystitis: responding fairly well to tamsulosin and finasteride as of 09/2016 urol f/u.  Urolift and bladd bx 02/2018-->doing great as of 07/2019 urol f/u.   Deafness in right ear    Diastolic dysfunction    Erythrocytosis    stable dating back to 2009 (17-18 range). Iron normal. No s/s of OSA.  Epo normal.  Just following as long as stable as of 10/2020 (no hem/onc ref)   Gross hematuria    hematuria protocol w/u being done by urol as of their 01/23/18 note.   History of back injury    Back fracture fell from tree   History of erectile dysfunction    History of fracture of right ankle 05/2015   History of kidney stones    History of staph infection 10 yrs ago   after back surgery   HTN (hypertension)    Hyperlipidemia    Low back pain    Microhematuria    chronic; pt gets periodic cystoscopy for monitoring, most recent 07/30/20-->bx showed benign urothelium and submucosa, with eosinophilic cystitis.   Nephrolithiasis 2015   CT showed 2cm left sided stone which was stable since 2006 imaging and likely in renal parenchyma.  2.5 cm as of 08/2017 neph  f/u: renal u/s 12/2017-->no hydro. KUB showed stable L sided 18 mm stone. Cystoscopy 12/2017 erythematous mucosa at dome, no mass.    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  Annotation: previous methicillin sensitive  staphylococcus aureus 2006 Qualifier: Diagnosis of  By: Ninetta Lights MD, Tinnie Gens  (ID Specialist)     Prostate cancer screening    via Urol--> PSA 1.39 April 2019. 1.36 Feb 2020.   TIA (transient ischemic attack)    03/2022   Tinnitus, left ear     PAST SURGICAL HISTORY: Past Surgical History:  Procedure Laterality Date   COLONOSCOPY  10/12/2015   repeat 5 years   COLONOSCOPY W/ POLYPECTOMY  2007   Negative 2012 and 2017; Dr Gita Kudo 2022.  CYSTOSCOPY     with bladder biopsy:  dome of bladder with an area of chronic inflammation.  No malignancy.   CYSTOSCOPY WITH BIOPSY N/A 02/19/2018   Procedure: CYSTOSCOPY WITH BIOPSY/ FULGURATION / UROLIFT;  Surgeon: Jerilee Field, MD;  Location: [facility];  Service: Urology;  Laterality: N/A;   CYSTOSCOPY WITH FULGERATION N/A 07/30/2020   Bx benign. Procedure: CYSTOSCOPY WITH BLADDER BIOPSY/FULGERATION;  Surgeon: Jerilee Field, MD;  Location: [facility];  Service: Urology;  Laterality: N/A;   CYSTOSCOPY WITH INSERTION OF UROLIFT  02/19/2018   LUMBAR LAMINECTOMY     Dr Venetia Maxon performed 2nd & 3rd procedures (pt also has hx of fall and vertebral fracture years prior to his surgeries.   TONSILLECTOMY  as child   TRANSTHORACIC ECHOCARDIOGRAM     03/2022 EF normal, grd I DD, mild AI, mod Pulm insuff    ALLERGIES: No Known Allergies  FAMILY HISTORY: Family History  Problem Relation Age of Onset   Stroke Mother    Cancer Mother 71       melanoma   Cancer Father 11       lung cancer; [employer] 2 (nonsmoker)   Heart disease Maternal Grandmother    Heart disease Maternal Grandfather    Heart disease Paternal Grandmother     Cancer Brother        bladder cancer    SOCIAL HISTORY: Social History   Tobacco Use   Smoking status: Former    Current packs/day: 0.00    Average packs/day: 2.0 packs/day for 20.0 years (40.0 ttl pk-yrs)    Types: Cigarettes    Start date: 05/22/1953    Quit date: 05/22/1973    Years since quitting: 49.7   Smokeless tobacco: Current    Types: Chew   Tobacco comments:    35 years ago as of 2013  Vaping Use   Vaping status: Never Used  Substance Use Topics   Alcohol use: Not Currently   Drug use: No   Social History   Social History Narrative   Married, 1 son and 2 grandchildren.   Orig from [place] area, [employer].   Retired Visual merchandiser (age 58).   Loves fishing (ocean).  Belongs to [employer].            Objective:  Vital Signs:  BP 130/79   Pulse 70   Resp 18   Ht 5\' 8"  (1.727 m)   Wt 243 lb (110.2 kg)   SpO2 95%   BMI 36.95 kg/m   General: No acute distress.  Patient appears well-groomed.   Head:  Normocephalic/atraumatic Neck: supple, no paraspinal tenderness, full range of motion Heart: regular rate and rhythm Lungs: Clear to auscultation bilaterally. Vascular: No carotid bruits.  Neurological Exam: Mental status: alert and oriented, speech fluent and not dysarthric, language intact.  Cranial nerves: CN I: not tested CN II: pupils equal, round and reactive to light, visual fields intact CN III, IV, VI:  full range of motion, no nystagmus, no ptosis CN V: facial sensation intact. CN VII: upper and lower face symmetric CN VIII: hearing intact CN IX, X: uvula midline CN XI: sternocleidomastoid and trapezius muscles intact CN XII: tongue midline  Bulk & Tone: normal, no fasciculations. Motor:  muscle strength 5/5 throughout Deep Tendon Reflexes:  2+ throughout.   Sensation:  Light touch sensation intact. Finger to nose testing:  Without dysmetria.     Gait:  Normal station and stride.  Romberg negative.   Labs and Imaging  review: New results: Heart monitor (01/24/23, 12/22/22, 11/19/22, 10/17/22): no afib  Previously reviewed results: TSH (05/23/22): 1.33   Recent Labs           Lab Results  Component Value Date    HGBA1C 5.6 04/22/2022        Recent Labs           Lab Results  Component Value Date    TSH 1.40 07/05/2015      Lipid panel (04/22/22): Component     Latest Ref Rng 04/22/2022  Cholesterol     0 - 200 mg/dL 295   Triglycerides     <150 mg/dL 74   HDL Cholesterol     >40 mg/dL 33 (L)   Total CHOL/HDL Ratio     RATIO 3.6   VLDL     0 - 40 mg/dL 15   LDL (calc)     0 - 99 mg/dL 70     Ziopatch (11/09/28): Sinus rhythm with heart rate of 79 bpm   Rare atrial tachycardia runs   Rare PAC, occasional PVC's   Brief short episodes (4 in total) of non sustained ventricular tachycardia. Normal EF on ECHO 04/2022   No atrial fibrillation     Patch Wear Time:  7 days and 1 hours (2023-12-29T08:41:35-0500 to 2024-01-05T09:49:20-0500)   Patient had a min HR of 45 bpm, max HR of 255 bpm, and avg HR of 79 bpm. Predominant underlying rhythm was Sinus Rhythm. 4 Ventricular Tachycardia runs occurred, the run with the fastest interval lasting 13 beats with a max rate of 255 bpm (avg 231 bpm);  the run with the fastest interval was also the longest. 22 Supraventricular Tachycardia runs occurred, the run with the fastest interval lasting 5 beats with a max rate of 197 bpm, the longest lasting 11.5 secs with an avg rate of 122 bpm. Isolated SVEs  were rare (<1.0%), SVE Couplets were rare (<1.0%), and SVE Triplets were rare (<1.0%). Isolated VEs were occasional (2.5%, 19614), VE Couplets were rare (<1.0%, 161), and VE Triplets were rare (<1.0%, 3). Ventricular Bigeminy and Trigeminy were present.   CT head wo contrast (04/21/22): FINDINGS: Brain: There is no mass, hemorrhage or extra-axial collection. The size and configuration of the ventricles and extra-axial CSF spaces are normal. The brain  parenchyma is normal, without evidence of acute or chronic infarction.   Vascular: No abnormal hyperdensity of the major intracranial arteries or dural venous sinuses. No intracranial atherosclerosis.   Skull: The visualized skull base, calvarium and extracranial soft tissues are normal.   Sinuses/Orbits: No fluid levels or advanced mucosal thickening of the visualized paranasal sinuses. No mastoid or middle ear effusion. The orbits are normal.   ASPECTS [facility] Stroke Program Early CT Score)   - Ganglionic level infarction (caudate, lentiform nuclei, internal capsule, insula, M1-M3 cortex): 7   - Supraganglionic infarction (M4-M6 cortex): 3   Total score (0-10 with 10 being normal): 10   IMPRESSION: 1. No acute intracranial abnormality. 2. ASPECTS is 10.   CTA head and neck/CTP (04/21/22): FINDINGS: CTA NECK FINDINGS   SKELETON: There is no bony spinal canal stenosis. No lytic or blastic lesion.   OTHER NECK: Normal pharynx, larynx and major salivary glands. No cervical lymphadenopathy. Unremarkable thyroid gland.   UPPER CHEST: No pneumothorax or pleural effusion. No nodules or masses.   AORTIC ARCH:   There is no calcific atherosclerosis of the aortic arch. There is no aneurysm, dissection or hemodynamically significant stenosis of the visualized  portion of the aorta. Conventional 3 vessel aortic branching pattern. The visualized proximal subclavian arteries are widely patent.   RIGHT CAROTID SYSTEM: Normal without aneurysm, dissection or stenosis.   LEFT CAROTID SYSTEM: Normal without aneurysm, dissection or stenosis.   VERTEBRAL ARTERIES: Left dominant configuration. Both origins are clearly patent. There is no dissection, occlusion or flow-limiting stenosis to the skull base (V1-V3 segments).   CTA HEAD FINDINGS   POSTERIOR CIRCULATION:   --Vertebral arteries: Normal V4 segments.   --Inferior cerebellar arteries: Normal.   --Basilar artery:  Normal.   --Superior cerebellar arteries: Normal.   --Posterior cerebral arteries (PCA): Normal.   ANTERIOR CIRCULATION:   --Intracranial internal carotid arteries: Normal.   --Anterior cerebral arteries (ACA): Normal. Both A1 segments are present. Patent anterior communicating artery (a-comm).   --Middle cerebral arteries (MCA): Normal.   VENOUS SINUSES: As permitted by contrast timing, patent.   ANATOMIC VARIANTS: None   Review of the MIP images confirms the above findings.   Right-sided pansinusitis.   CT Brain Perfusion Findings:   ASPECTS: 10   CBF (<30%) Volume: 0mL   Perfusion (Tmax>6.0s) volume: 10mL   Mismatch Volume: 10mL   Infarction Location:Area of ischemia corresponds to a superficial watershed pattern at the right ACA/MCA and MCA/PCA junctions.   IMPRESSION: 1. No emergent large vessel occlusion or high-grade stenosis of the intracranial or cervical arteries. 2. Small area of ischemia corresponds to a superficial watershed pattern at the right ACA/MCA and MCA/PCA junctions.   MRI brain wo contrast (04/22/22): FINDINGS: Brain: No acute infarction, hemorrhage, hydrocephalus, extra-axial collection or mass lesion. Mild chronic small vessel ischemia in the deep cerebral white matter and pons.   Vascular: Normal flow voids.   Skull and upper cervical spine: Normal marrow signal   Sinuses/Orbits: Opacification of right-sided maxillary, ethmoid, sphenoid, and frontal sinuses due to nasal cavity obstruction.   IMPRESSION: 1. No acute finding including infarct. 2. Chronic small vessel ischemia in the cerebral white matter. 3. Active sinusitis with diffuse obstruction of right-sided air cells, new from 2019.   MRI brain wo contrast (04/30/22): FINDINGS: Brain: No acute infarction, hemorrhage, hydrocephalus, extra-axial collection or mass lesion. Findings of severe chronic microvascular ischemic change, unchanged from prior exam. Cavum septum  pellucidum.   Vascular: Normal flow voids.   Skull and upper cervical spine: Normal marrow signal.   Sinuses/Orbits: Redemonstrated extensive OMC pattern polypoid mucosal thickening in the right frontal, ethmoid, and maxillary sinuses.   Other: None.   IMPRESSION: 1. No acute intracranial process. 2. Stable severe chronic microvascular ischemic change. 3. Redemonstrated extensive OMC pattern polypoid mucosal thickening in the right frontal, ethmoid, and maxillary sinuses.   Echo (04/22/22): IMPRESSIONS   1. Left ventricular ejection fraction, by estimation, is 65 to 70%. The  left ventricle has normal function. The left ventricle has no regional  wall motion abnormalities. Left ventricular diastolic parameters are  consistent with Grade I diastolic dysfunction (impaired relaxation).   2. Right ventricular systolic function is normal. The right ventricular  size is normal.   3. The mitral valve is normal in structure. No evidence of mitral valve  regurgitation. No evidence of mitral stenosis.   4. The aortic valve is normal in structure. Aortic valve regurgitation is  mild. No aortic stenosis is present.   5. Pulmonic valve regurgitation is moderate.   6. The inferior vena cava is normal in size with greater than 50%  respiratory variability, suggesting right atrial pressure of 3 mmHg.   Conclusion(s)/Recommendation(s): No intracardiac  source of embolism  detected on this transthoracic study. Consider a transesophageal  echocardiogram to exclude cardiac source of embolism if clinically  indicated.   Assessment/Plan:  This is Derrick [name], a 72 y.o. male with transient episodes of left sided numbness, likely TIA. Available diagnostic data is significant for MRI brain x 2 showing no acute infarct but severe chronic microvascular ischemia. Vessel imaging with CTA head and neck did not show significant stenosis. His symptoms are likely related to small vessel disease from HTN,  HLD. He has had no further episodes since 05/2022, s/p 3 weeks of DAPT.   Plan: -Continue aspirin 81 mg daily -Continue atorvastatin 80 mg daily -Discussed stroke warning signs and when to seek emergency medical attention  Return to clinic as needed  Jacquelyne Balint, MD

## 2023-02-09 ENCOUNTER — Encounter: Payer: Self-pay | Admitting: Neurology

## 2023-02-09 ENCOUNTER — Ambulatory Visit (INDEPENDENT_AMBULATORY_CARE_PROVIDER_SITE_OTHER): Payer: Medicare Other | Admitting: Neurology

## 2023-02-09 VITALS — BP 130/79 | HR 70 | Resp 18 | Ht 68.0 in | Wt 243.0 lb

## 2023-02-09 DIAGNOSIS — I10 Essential (primary) hypertension: Secondary | ICD-10-CM | POA: Diagnosis not present

## 2023-02-09 DIAGNOSIS — G459 Transient cerebral ischemic attack, unspecified: Secondary | ICD-10-CM

## 2023-02-09 DIAGNOSIS — E782 Mixed hyperlipidemia: Secondary | ICD-10-CM | POA: Diagnosis not present

## 2023-02-09 DIAGNOSIS — I161 Hypertensive emergency: Secondary | ICD-10-CM | POA: Diagnosis not present

## 2023-02-09 NOTE — Patient Instructions (Signed)
-  Continue aspirin 81 mg daily -Continue atorvastatin 80 mg daily  If you have new difficulty speaking, face droop, numbness on one side of the body, weakness on one side of the body, or dizziness/imbalance, this could be the sign of a stroke. Don't wait, please call EMS and be evaluated at the nearest emergency room.  The physicians and staff at [facility] are committed to providing excellent care. You may receive a survey requesting feedback about your experience at our office. We strive to receive "very good" responses to the survey questions. If you feel that your experience would prevent you from giving the office a "very good " response, please contact our office to try to remedy the situation. We may be reached at [phone]. Thank you for taking the time out of your busy day to complete the survey.  Jacquelyne Balint, MD [facility]

## 2023-02-12 NOTE — Progress Notes (Signed)
Carelink Summary Report / Loop Recorder 

## 2023-02-23 ENCOUNTER — Ambulatory Visit: Payer: Medicare Other | Admitting: Neurology

## 2023-03-02 ENCOUNTER — Other Ambulatory Visit: Payer: Self-pay | Admitting: Family Medicine

## 2023-03-05 ENCOUNTER — Ambulatory Visit (INDEPENDENT_AMBULATORY_CARE_PROVIDER_SITE_OTHER): Payer: Medicare Other

## 2023-03-05 DIAGNOSIS — G459 Transient cerebral ischemic attack, unspecified: Secondary | ICD-10-CM | POA: Diagnosis not present

## 2023-03-05 LAB — CUP PACEART REMOTE DEVICE CHECK
Date Time Interrogation Session: 20241013231210
Implantable Pulse Generator Implant Date: 20240423

## 2023-03-06 ENCOUNTER — Other Ambulatory Visit: Payer: Self-pay | Admitting: Family Medicine

## 2023-03-07 ENCOUNTER — Encounter: Payer: Self-pay | Admitting: Family Medicine

## 2023-03-07 ENCOUNTER — Ambulatory Visit: Payer: Medicare Other | Admitting: Family Medicine

## 2023-03-07 VITALS — BP 105/69 | HR 70 | Wt 237.8 lb

## 2023-03-07 DIAGNOSIS — Z8673 Personal history of transient ischemic attack (TIA), and cerebral infarction without residual deficits: Secondary | ICD-10-CM | POA: Diagnosis not present

## 2023-03-07 DIAGNOSIS — E7800 Pure hypercholesterolemia, unspecified: Secondary | ICD-10-CM | POA: Diagnosis not present

## 2023-03-07 DIAGNOSIS — I10 Essential (primary) hypertension: Secondary | ICD-10-CM

## 2023-03-07 DIAGNOSIS — D751 Secondary polycythemia: Secondary | ICD-10-CM

## 2023-03-07 DIAGNOSIS — Z23 Encounter for immunization: Secondary | ICD-10-CM

## 2023-03-07 LAB — CBC
HCT: 51.8 % (ref 39.0–52.0)
Hemoglobin: 17 g/dL (ref 13.0–17.0)
MCHC: 32.8 g/dL (ref 30.0–36.0)
MCV: 91.4 fL (ref 78.0–100.0)
Platelets: 202 10*3/uL (ref 150.0–400.0)
RBC: 5.66 Mil/uL (ref 4.22–5.81)
RDW: 15 % (ref 11.5–15.5)
WBC: 7.9 10*3/uL (ref 4.0–10.5)

## 2023-03-07 LAB — LIPID PANEL
Cholesterol: 98 mg/dL (ref 0–200)
HDL: 36.3 mg/dL — ABNORMAL LOW (ref >39.00)
LDL Cholesterol: 48 mg/dL (ref 0–99)
NonHDL: 61.94
Total CHOL/HDL Ratio: 3
Triglycerides: 70 mg/dL (ref 0.0–149.0)
VLDL: 14 mg/dL (ref 0.0–40.0)

## 2023-03-07 LAB — COMPREHENSIVE METABOLIC PANEL
ALT: 17 U/L (ref 0–53)
AST: 18 U/L (ref 0–37)
Albumin: 4 g/dL (ref 3.5–5.2)
Alkaline Phosphatase: 67 U/L (ref 39–117)
BUN: 14 mg/dL (ref 6–23)
CO2: 29 meq/L (ref 19–32)
Calcium: 9.2 mg/dL (ref 8.4–10.5)
Chloride: 100 meq/L (ref 96–112)
Creatinine, Ser: 1.05 mg/dL (ref 0.40–1.50)
GFR: 69.98 mL/min (ref >60.00)
Glucose, Bld: 102 mg/dL — ABNORMAL HIGH (ref 70–99)
Potassium: 4 meq/L (ref 3.5–5.1)
Sodium: 138 meq/L (ref 135–145)
Total Bilirubin: 1 mg/dL (ref 0.2–1.2)
Total Protein: 6.7 g/dL (ref 6.0–8.3)

## 2023-03-07 MED ORDER — PANTOPRAZOLE SODIUM 40 MG PO TBEC: 40.0000 mg | DELAYED_RELEASE_TABLET | Freq: Every day | ORAL | 1 refills | Status: DC

## 2023-03-07 MED ORDER — FINASTERIDE 5 MG PO TABS: ORAL_TABLET | ORAL | 1 refills | Status: DC

## 2023-03-07 NOTE — Progress Notes (Signed)
OFFICE VISIT  03/07/2023  CC:  Chief Complaint  Patient presents with   Medical Management of Chronic Issues    Patient is a 72 y.o. male who presents for 21-month follow-up hypertension, hyperlipidemia, chronic erythrocytosis.  INTERIM HX: He feels very good.  Interval development: Cardiac MRI concerning for sarcoidosis.  PET scan was normal, w/out evidence of sarcoid *12/2022). LOOP implantation was done 09/12/2022 by Dr. Graciela Husbands.  Severe obstructive sleep apnea diagnosed as well. He has made great effort at lifestyle changes in order to lose weight over the last few months.  He snores less and feels like he is more rested when he wakes up. He decided not to do CPAP machine.  Home bp's normal.  ROS --> no fevers, no CP, no SOB, no wheezing, no cough, no dizziness, no HAs, no rashes, no melena/hematochezia.  No polyuria or polydipsia.  No myalgias or arthralgias.  No focal weakness, paresthesias, or tremors.  No acute vision or hearing abnormalities.  No dysuria or unusual/new urinary urgency or frequency.  No recent changes in lower legs. No n/v/d or abd pain.  No palpitations.    Past Medical History:  Diagnosis Date   BPH with obstruction/lower urinary tract symptoms    +Hx of acute urinary retention/acute cystitis: responding fairly well to tamsulosin and finasteride as of 09/2016 urol f/u.  Urolift and bladd bx 02/2018-->doing great as of 07/2019 urol f/u.   Deafness in right ear    Diastolic dysfunction    Erythrocytosis    stable dating back to 2009 (17-18 range). Iron normal. Epo normal.  Just following as long as stable as of 10/2020 (no hem/onc ref).  Dx'd severe OSA 09/2022   Gross hematuria    hematuria protocol w/u being done by urol as of their 01/23/18 note.   History of back injury    Back fracture fell from tree   History of erectile dysfunction    History of fracture of right ankle 05/2015   History of kidney stones    History of staph infection 10 yrs ago   after  back surgery   HTN (hypertension)    Hyperlipidemia    Low back pain    Microhematuria    chronic; pt gets periodic cystoscopy for monitoring, most recent 07/30/20-->bx showed benign urothelium and submucosa, with eosinophilic cystitis.   Nephrolithiasis 2015   CT showed 2cm left sided stone which was stable since 2006 imaging and likely in renal parenchyma.  2.5 cm as of 08/2017 neph f/u: renal u/s 12/2017-->no hydro. KUB showed stable L sided 18 mm stone. Cystoscopy 12/2017 erythematous mucosa at dome, no mass.    NSVT (nonsustained ventricular tachycardia) (HCC)    LOOP implanted 08/2022   Obesity, Class II, BMI 35-39.9    OSA (obstructive sleep apnea)    OSTEOMYELITIS, CHRONIC 02/21/2006   Staph infection s/p lumbar surgery (surgery x 3).  Annotation: previous methicillin sensitive  staphylococcus aureus 2006 Qualifier: Diagnosis of  By: Ninetta Lights MD, Tinnie Gens  (ID Specialist)     Prostate cancer screening    via Urol--> PSA 1.39 April 2019. 1.36 Feb 2020.   TIA (transient ischemic attack)    03/2022.   Tinnitus, left ear     Past Surgical History:  Procedure Laterality Date   COLONOSCOPY  10/12/2015   repeat 5 years   COLONOSCOPY W/ POLYPECTOMY  2007   Negative 2012 and 2017; Dr Gita Kudo 2022.   CYSTOSCOPY     with bladder biopsy:  dome of bladder with  an area of chronic inflammation.  No malignancy.   CYSTOSCOPY WITH BIOPSY N/A 02/19/2018   Procedure: CYSTOSCOPY WITH BIOPSY/ FULGURATION / UROLIFT;  Surgeon: Jerilee Field, MD;  Location: [facility];  Service: Urology;  Laterality: N/A;   CYSTOSCOPY WITH FULGERATION N/A 07/30/2020   Bx benign. Procedure: CYSTOSCOPY WITH BLADDER BIOPSY/FULGERATION;  Surgeon: Jerilee Field, MD;  Location: [facility];  Service: Urology;  Laterality: N/A;   CYSTOSCOPY WITH INSERTION OF UROLIFT  02/19/2018   LUMBAR LAMINECTOMY     Dr Venetia Maxon performed 2nd & 3rd procedures (pt also has hx of fall and vertebral fracture years prior to  his surgeries.   TONSILLECTOMY  as child   TRANSTHORACIC ECHOCARDIOGRAM     03/2022 EF normal, grd I DD, mild AI, mod Pulm insuff    Outpatient Medications Prior to Visit  Medication Sig Dispense Refill   Ascorbic Acid (VITAMIN C) 1000 MG tablet Take 1,000 mg by mouth at bedtime.     aspirin EC 81 MG tablet Take 1 tablet (81 mg total) by mouth daily. Swallow whole. 30 tablet 11   atorvastatin (LIPITOR) 80 MG tablet Take 1 tablet (80 mg total) by mouth daily. 90 tablet 3   cloNIDine (CATAPRES) 0.1 MG tablet Take 1 tablet (0.1 mg total) by mouth daily as needed (Take once if systolic blood pressure over 170). 30 tablet 0   losartan-hydrochlorothiazide (HYZAAR) 100-12.5 MG tablet Take 1 tablet by mouth daily. 90 tablet 3   metoprolol succinate (TOPROL-XL) 100 MG 24 hr tablet Take 1 tablet (100 mg total) by mouth daily. Take with or immediately following a meal. 90 tablet 3   tadalafil (CIALIS) 10 MG tablet 1-2 tabs po qd prn.  Take approx 1 hour prior to intercourse 10 tablet 1   tamsulosin (FLOMAX) 0.4 MG CAPS capsule TAKE 1 CAPSULE BY MOUTH EVERY DAY 90 capsule 1   finasteride (PROSCAR) 5 MG tablet TAKE 1 TABLET BY MOUTH EVERYDAY AT BEDTIME 30 tablet 0   pantoprazole (PROTONIX) 40 MG tablet TAKE 1 TABLET BY MOUTH EVERY DAY 30 tablet 0   No facility-administered medications prior to visit.    No Known Allergies  Review of Systems As per HPI  PE:    03/07/2023    9:36 AM 02/09/2023    1:47 PM 11/15/2022    4:47 PM  Vitals with BMI  Height  5\' 8"  5\' 8"   Weight 237 lbs 13 oz 243 lbs 249 lbs  BMI  36.96 37.87  Systolic 105 130 213  Diastolic 69 79 80  Pulse 70 70 75     Physical Exam  Gen: Alert, well appearing.  Patient is oriented to person, place, time, and situation. AFFECT: pleasant, lucid thought and speech. CV: RRR, no m/r/g.   LUNGS: CTA bilat, nonlabored resps, good aeration in all lung fields. EXT: no clubbing or cyanosis.  Trace RLL pitting edema, 1-2+ L LL pitting  edema.    LABS:  Last CBC Lab Results  Component Value Date   WBC 10.6 08/18/2022   HGB 18.0 (H) 08/18/2022   HCT 53.3 (H) 08/18/2022   MCV 88 08/18/2022   MCH 29.8 08/18/2022   RDW 14.5 08/18/2022   PLT 259 08/18/2022   Lab Results  Component Value Date   IRON 111 11/10/2020   TIBC 280 11/10/2020   FERRITIN 180 11/10/2020   Last metabolic panel Lab Results  Component Value Date   GLUCOSE 90 08/18/2022   NA 139 08/18/2022   K 4.5  08/18/2022   CL 99 08/18/2022   CO2 25 08/18/2022   BUN 21 08/18/2022   CREATININE 1.09 08/18/2022   EGFR 72 08/18/2022   CALCIUM 9.1 08/18/2022   PHOS 2.6 06/19/2012   PROT 7.2 04/21/2022   ALBUMIN 3.7 04/21/2022   BILITOT 0.8 04/21/2022   ALKPHOS 71 04/21/2022   AST 21 04/21/2022   ALT 21 04/21/2022   ANIONGAP 10 04/30/2022   Last lipids Lab Results  Component Value Date   CHOL 118 04/22/2022   HDL 33 (L) 04/22/2022   LDLCALC 70 04/22/2022   LDLDIRECT 133.0 06/26/2016   TRIG 74 04/22/2022   CHOLHDL 3.6 04/22/2022   Last hemoglobin A1c Lab Results  Component Value Date   HGBA1C 5.6 04/22/2022   Last thyroid functions Lab Results  Component Value Date   TSH 1.33 05/23/2022   IMPRESSION AND PLAN:  #1 hypertension, well-controlled on losartan/HCTZ 100/12.5, 1 tab daily and Toprol-XL 100 mg daily. Electrolytes and creatinine monitoring today.  2.  Hypercholesterolemia, doing well on a atorvastatin 80 mg a day. LDL was at goal about 10 months ago--->70. Lipid panel and hepatic panel today.  #3 chronic erythrocytosis. Suspect due to severe obstructive sleep apnea. Monitor CBC today.  4.  Severe obstructive sleep apnea.  He decided against use of CPAP. Snoring is improving since he has been losing significant weight.  #5 TIA. History of atrial arrhythmias and NSVT. Dr. Graciela Husbands did LOOP recorder implantation 09/12/2022.  An After Visit Summary was printed and given to the patient.  FOLLOW UP: Return in about 6 months  (around 09/05/2023) for routine chronic illness f/u.  Signed:  Santiago Bumpers, MD           03/07/2023

## 2023-03-07 NOTE — Addendum Note (Signed)
Addended by: Arty Baumgartner A on: 03/07/2023 10:52 AM   Modules accepted: Orders

## 2023-03-19 NOTE — Progress Notes (Signed)
Carelink Summary Report / Loop Recorder 

## 2023-03-20 ENCOUNTER — Telehealth: Payer: Self-pay | Admitting: Family Medicine

## 2023-03-20 MED ORDER — TAMSULOSIN HCL 0.4 MG PO CAPS: 0.4000 mg | ORAL_CAPSULE | Freq: Every day | ORAL | 1 refills | Status: DC

## 2023-03-20 NOTE — Telephone Encounter (Signed)
Patients wife called to request medication refill for tamsulosin (FLOMAX) 0.4 MG CAPS capsule  [facility] is the correct pharmacy.

## 2023-03-20 NOTE — Telephone Encounter (Signed)
Refill sent.

## 2023-04-09 ENCOUNTER — Ambulatory Visit (INDEPENDENT_AMBULATORY_CARE_PROVIDER_SITE_OTHER): Payer: Medicare Other

## 2023-04-09 DIAGNOSIS — I5032 Chronic diastolic (congestive) heart failure: Secondary | ICD-10-CM | POA: Diagnosis not present

## 2023-04-09 LAB — CUP PACEART REMOTE DEVICE CHECK
Date Time Interrogation Session: 20241117233129
Implantable Pulse Generator Implant Date: 20240423

## 2023-04-26 ENCOUNTER — Other Ambulatory Visit: Payer: Self-pay | Admitting: Family Medicine

## 2023-05-02 NOTE — Progress Notes (Signed)
Carelink Summary Report / Loop Recorder 

## 2023-05-14 ENCOUNTER — Ambulatory Visit (INDEPENDENT_AMBULATORY_CARE_PROVIDER_SITE_OTHER): Payer: Medicare Other

## 2023-05-14 DIAGNOSIS — G459 Transient cerebral ischemic attack, unspecified: Secondary | ICD-10-CM

## 2023-05-14 LAB — CUP PACEART REMOTE DEVICE CHECK
Date Time Interrogation Session: 20241222232229
Implantable Pulse Generator Implant Date: 20240423

## 2023-06-18 ENCOUNTER — Ambulatory Visit: Payer: Medicare Other

## 2023-06-18 DIAGNOSIS — G459 Transient cerebral ischemic attack, unspecified: Secondary | ICD-10-CM | POA: Diagnosis not present

## 2023-06-18 LAB — CUP PACEART REMOTE DEVICE CHECK
Date Time Interrogation Session: 20250126232543
Implantable Pulse Generator Implant Date: 20240423

## 2023-06-20 ENCOUNTER — Encounter: Payer: Self-pay | Admitting: Internal Medicine

## 2023-06-20 NOTE — Progress Notes (Signed)
Carelink Summary Report / Loop Recorder

## 2023-07-05 ENCOUNTER — Other Ambulatory Visit: Payer: Self-pay | Admitting: Family Medicine

## 2023-07-23 ENCOUNTER — Encounter: Payer: Self-pay | Admitting: Internal Medicine

## 2023-07-23 ENCOUNTER — Ambulatory Visit (INDEPENDENT_AMBULATORY_CARE_PROVIDER_SITE_OTHER): Payer: Medicare Other

## 2023-07-23 DIAGNOSIS — G459 Transient cerebral ischemic attack, unspecified: Secondary | ICD-10-CM | POA: Diagnosis not present

## 2023-07-24 LAB — CUP PACEART REMOTE DEVICE CHECK
Date Time Interrogation Session: 20250302231619
Implantable Pulse Generator Implant Date: 20240423

## 2023-07-26 NOTE — Progress Notes (Signed)
 Carelink Summary Report / Loop Recorder

## 2023-07-29 ENCOUNTER — Other Ambulatory Visit: Payer: Self-pay | Admitting: Family Medicine

## 2023-08-03 ENCOUNTER — Other Ambulatory Visit: Payer: Self-pay | Admitting: Internal Medicine

## 2023-08-23 NOTE — Progress Notes (Signed)
 Carelink Summary Report / Loop Recorder

## 2023-08-23 NOTE — Addendum Note (Signed)
 Addended by: Geralyn Flash D on: 08/23/2023 10:52 AM   Modules accepted: Orders

## 2023-08-27 ENCOUNTER — Ambulatory Visit: Payer: Medicare Other

## 2023-08-27 DIAGNOSIS — G459 Transient cerebral ischemic attack, unspecified: Secondary | ICD-10-CM | POA: Diagnosis not present

## 2023-08-28 LAB — CUP PACEART REMOTE DEVICE CHECK
Date Time Interrogation Session: 20250406231809
Implantable Pulse Generator Implant Date: 20240423

## 2023-09-01 ENCOUNTER — Encounter: Payer: Self-pay | Admitting: Internal Medicine

## 2023-09-04 ENCOUNTER — Encounter: Payer: Self-pay | Admitting: Family Medicine

## 2023-09-04 NOTE — Progress Notes (Unsigned)
 OFFICE VISIT  09/05/2023  CC:  Chief Complaint  Patient presents with   Medical Management of Chronic Issues    Pt is fasting    Patient is a 73 y.o. male who presents for 92-month follow-up hypertension, hyperlipidemia, and chronic erythrocytosis. A/P as of last visit: "1 hypertension, well-controlled on losartan/HCTZ 100/12.5, 1 tab daily and Toprol-XL 100 mg daily. Electrolytes and creatinine monitoring today.   2.  Hypercholesterolemia, doing well on a atorvastatin 80 mg a day. LDL was at goal about 10 months ago--->70. Lipid panel and hepatic panel today.   #3 chronic erythrocytosis. Suspect due to severe obstructive sleep apnea. Monitor CBC today.   4.  Severe obstructive sleep apnea.  He decided against use of CPAP. Snoring is improving since he has been losing significant weight.   #5 TIA. History of atrial arrhythmias and NSVT. Dr. Rodolfo Clan did LOOP recorder implantation 09/12/2022."  INTERIM HX: All labs normal last visit.  Derrick [name] feels well He swims 3 days a week.  Home blood pressures consistently around 120/70.  ROS --> no fevers, no CP, no SOB, no wheezing, no cough, no dizziness, no HAs, no rashes, no melena/hematochezia.  No polyuria or polydipsia.  No myalgias or arthralgias.  No focal weakness, paresthesias, or tremors.  No acute vision or hearing abnormalities.  No dysuria or unusual/new urinary urgency or frequency.  No recent changes in lower legs. No n/v/d or abd pain.  No palpitations.     Past Medical History:  Diagnosis Date   BPH with obstruction/lower urinary tract symptoms    +Hx of acute urinary retention/acute cystitis: responding fairly well to tamsulosin and finasteride as of 09/2016 urol f/u.  Urolift and bladd bx 02/2018-->doing great as of 07/2019 urol f/u.   Deafness in right ear    Diastolic dysfunction    Erythrocytosis    stable dating back to 2009 (17-18 range). Iron normal. Epo normal.  Just following as long as stable as of 10/2020 (no  hem/onc ref).  Dx'd severe OSA 09/2022   Gross hematuria    hematuria protocol w/u being done by urol as of their 01/23/18 note.   History of back injury    Back fracture fell from tree   History of erectile dysfunction    History of fracture of right ankle 05/2015   History of kidney stones    History of staph infection 10 yrs ago   after back surgery   HTN (hypertension)    Hyperlipidemia    Low back pain    Microhematuria    chronic; pt gets periodic cystoscopy for monitoring, most recent 07/30/20-->bx showed benign urothelium and submucosa, with eosinophilic cystitis.   Nephrolithiasis 2015   CT showed 2cm left sided stone which was stable since 2006 imaging and likely in renal parenchyma.  2.5 cm as of 08/2017 neph f/u: renal u/s 12/2017-->no hydro. KUB showed stable L sided 18 mm stone. Cystoscopy 12/2017 erythematous mucosa at dome, no mass.    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  Annotation: previous methicillin sensitive  staphylococcus aureus 2006 Qualifier: Diagnosis of  By: Alwin Baars MD, Susana Enter  (ID Specialist)     Prostate cancer screening    via Urol--> PSA 1.39 April 2019. 1.36 Feb 2020.   TIA (transient ischemic  attack)    03/2022.   Tinnitus, left ear     Past Surgical History:  Procedure Laterality Date   COLONOSCOPY  10/12/2015   repeat 5 years   COLONOSCOPY W/ POLYPECTOMY  2007   Negative 2012 and 2017; Dr Wyvonna Heidelberg 2022.   CYSTOSCOPY     with bladder biopsy:  dome of bladder with an area of chronic inflammation.  No malignancy.   CYSTOSCOPY WITH BIOPSY N/A 02/19/2018   Procedure: CYSTOSCOPY WITH BIOPSY/ FULGURATION / UROLIFT;  Surgeon: Christina Coyer, MD;  Location: [facility];  Service: Urology;  Laterality: N/A;   CYSTOSCOPY WITH FULGERATION  N/A 07/30/2020   Bx benign. Procedure: CYSTOSCOPY WITH BLADDER BIOPSY/FULGERATION;  Surgeon: Christina Coyer, MD;  Location: [facility];  Service: Urology;  Laterality: N/A;   CYSTOSCOPY WITH INSERTION OF UROLIFT  02/19/2018   LOOP RECORDER IMPLANTATION     08/2022   LUMBAR LAMINECTOMY     Dr Nigel Bart performed 2nd & 3rd procedures (pt also has hx of fall and vertebral fracture years prior to his surgeries.   TONSILLECTOMY  as child   TRANSTHORACIC ECHOCARDIOGRAM     03/2022 EF normal, grd I DD, mild AI, mod Pulm insuff    Outpatient Medications Prior to Visit  Medication Sig Dispense Refill   Ascorbic Acid (VITAMIN C) 1000 MG tablet Take 1,000 mg by mouth at bedtime.     aspirin EC 81 MG tablet Take 1 tablet (81 mg total) by mouth daily. Swallow whole. 30 tablet 11   cloNIDine (CATAPRES) 0.1 MG tablet Take 1 tablet (0.1 mg total) by mouth daily as needed (Take once if systolic blood pressure over 170). 30 tablet 0   losartan-hydrochlorothiazide (HYZAAR) 100-12.5 MG tablet TAKE 1 TABLET BY MOUTH EVERY DAY 90 tablet 1   metoprolol succinate (TOPROL-XL) 100 MG 24 hr tablet TAKE 1 TABLET BY MOUTH DAILY. TAKE WITH OR IMMEDIATELY FOLLOWING A MEAL. 90 tablet 1   tadalafil (CIALIS) 10 MG tablet 1-2 tabs po qd prn.  Take approx 1 hour prior to intercourse 10 tablet 1   atorvastatin (LIPITOR) 80 MG tablet TAKE 1 TABLET BY MOUTH EVERY DAY 90 tablet 0   finasteride (PROSCAR) 5 MG tablet TAKE 1 TABLET BY MOUTH EVERYDAY AT BEDTIME 90 tablet 0   pantoprazole (PROTONIX) 40 MG tablet TAKE 1 TABLET BY MOUTH EVERY DAY 90 tablet 0   tamsulosin (FLOMAX) 0.4 MG CAPS capsule Take 1 capsule (0.4 mg total) by mouth daily. 90 capsule 1   No facility-administered medications prior to visit.    No Known Allergies  Review of Systems As per HPI  PE:    09/05/2023    8:47 AM 03/07/2023    9:36 AM 02/09/2023    1:47 PM  Vitals with BMI  Height 5\' 8"   5\' 8"   Weight 245 lbs 10 oz 237 lbs 13 oz  243 lbs  BMI 37.35  36.96  Systolic 91 105 130  Diastolic 56 69 79  Pulse 67 70 70     Physical Exam  Gen: Alert, well appearing.  Patient is oriented to person, place, time, and situation. AFFECT: pleasant, lucid thought and speech. CV: RRR, no m/r/g.   LUNGS: CTA bilat, nonlabored resps, good aeration in all lung fields. EXT: no clubbing or cyanosis.  no edema.    LABS:  Last CBC Lab Results  Component Value Date   WBC 7.9 03/07/2023   HGB 17.0 03/07/2023   HCT 51.8 03/07/2023   MCV 91.4 03/07/2023  MCH 29.8 08/18/2022   RDW 15.0 03/07/2023   PLT 202.0 03/07/2023   Lab Results  Component Value Date   IRON 111 11/10/2020   TIBC 280 11/10/2020   FERRITIN 180 11/10/2020    Last metabolic panel Lab Results  Component Value Date   GLUCOSE 102 (H) 03/07/2023   NA 138 03/07/2023   K 4.0 03/07/2023   CL 100 03/07/2023   CO2 29 03/07/2023   BUN 14 03/07/2023   CREATININE 1.05 03/07/2023   GFR 69.98 03/07/2023   CALCIUM 9.2 03/07/2023   PHOS 2.6 06/19/2012   PROT 6.7 03/07/2023   ALBUMIN 4.0 03/07/2023   BILITOT 1.0 03/07/2023   ALKPHOS 67 03/07/2023   AST 18 03/07/2023   ALT 17 03/07/2023   ANIONGAP 10 04/30/2022   Last lipids Lab Results  Component Value Date   CHOL 98 03/07/2023   HDL 36.30 (L) 03/07/2023   LDLCALC 48 03/07/2023   LDLDIRECT 133.0 06/26/2016   TRIG 70.0 03/07/2023   CHOLHDL 3 03/07/2023   Last hemoglobin A1c Lab Results  Component Value Date   HGBA1C 5.6 04/22/2022   Lab Results  Component Value Date   PSA 1.36 07/04/2018   PSA 1.36 07/04/2018   PSA 1.95 06/21/2011   IMPRESSION AND PLAN:  #1 hypertension, well-controlled on losartan-HCTZ 100-12.5, 1 tab daily.  Also Toprol-XL 100 mg a day.  He has clonidine 0.1 mg to use as needed systolic blood pressure over 170.  #2 hypercholesterolemia, doing well on a atorvastatin 80 mg a day long-term. Lipid panel and hepatic panel today.  3.  Colon cancer screening. History of  adenomatous colon polyps. 2017 was last colonoscopy.  5-year repeat was recommended by GI --> Dr. Denece Finger has retired so I referred him to Dr. Evangeline Hilts today.  4. chronic erythrocytosis. Suspect due to severe obstructive sleep apnea (sleep study confirmed OSA but he declines CPAP). Monitor CBC today.  An After Visit Summary was printed and given to the patient.  FOLLOW UP: Return in about 6 months (around 03/06/2024) for routine chronic illness f/u.  Signed:  Arletha Lady, MD           09/05/2023

## 2023-09-05 ENCOUNTER — Encounter: Payer: Self-pay | Admitting: Family Medicine

## 2023-09-05 ENCOUNTER — Ambulatory Visit (INDEPENDENT_AMBULATORY_CARE_PROVIDER_SITE_OTHER): Payer: Medicare Other | Admitting: Family Medicine

## 2023-09-05 VITALS — BP 91/56 | HR 67 | Temp 97.7°F | Resp 14 | Ht 68.0 in | Wt 245.6 lb

## 2023-09-05 DIAGNOSIS — I10 Essential (primary) hypertension: Secondary | ICD-10-CM

## 2023-09-05 DIAGNOSIS — E7800 Pure hypercholesterolemia, unspecified: Secondary | ICD-10-CM

## 2023-09-05 DIAGNOSIS — D751 Secondary polycythemia: Secondary | ICD-10-CM

## 2023-09-05 DIAGNOSIS — Z1211 Encounter for screening for malignant neoplasm of colon: Secondary | ICD-10-CM | POA: Diagnosis not present

## 2023-09-05 LAB — LIPID PANEL
Cholesterol: 116 mg/dL (ref 0–200)
HDL: 43.8 mg/dL (ref >39.00)
LDL Cholesterol: 54 mg/dL (ref 0–99)
NonHDL: 71.7
Total CHOL/HDL Ratio: 3
Triglycerides: 87 mg/dL (ref 0.0–149.0)
VLDL: 17.4 mg/dL (ref 0.0–40.0)

## 2023-09-05 LAB — COMPREHENSIVE METABOLIC PANEL WITH GFR
ALT: 22 U/L (ref 0–53)
AST: 18 U/L (ref 0–37)
Albumin: 4.2 g/dL (ref 3.5–5.2)
Alkaline Phosphatase: 71 U/L (ref 39–117)
BUN: 16 mg/dL (ref 6–23)
CO2: 30 meq/L (ref 19–32)
Calcium: 9.1 mg/dL (ref 8.4–10.5)
Chloride: 100 meq/L (ref 96–112)
Creatinine, Ser: 1.05 mg/dL (ref 0.40–1.50)
GFR: 69.74 mL/min (ref >60.00)
Glucose, Bld: 98 mg/dL (ref 70–99)
Potassium: 4.4 meq/L (ref 3.5–5.1)
Sodium: 138 meq/L (ref 135–145)
Total Bilirubin: 0.8 mg/dL (ref 0.2–1.2)
Total Protein: 6.7 g/dL (ref 6.0–8.3)

## 2023-09-05 LAB — CBC
HCT: 52.8 % — ABNORMAL HIGH (ref 39.0–52.0)
Hemoglobin: 17.6 g/dL — ABNORMAL HIGH (ref 13.0–17.0)
MCHC: 33.3 g/dL (ref 30.0–36.0)
MCV: 91 fl (ref 78.0–100.0)
Platelets: 189 10*3/uL (ref 150.0–400.0)
RBC: 5.8 Mil/uL (ref 4.22–5.81)
RDW: 15.5 % (ref 11.5–15.5)
WBC: 8.2 10*3/uL (ref 4.0–10.5)

## 2023-09-05 MED ORDER — TAMSULOSIN HCL 0.4 MG PO CAPS: 0.4000 mg | ORAL_CAPSULE | Freq: Every day | ORAL | 3 refills | Status: AC

## 2023-09-05 MED ORDER — FINASTERIDE 5 MG PO TABS: ORAL_TABLET | ORAL | 3 refills | Status: DC

## 2023-09-05 MED ORDER — PANTOPRAZOLE SODIUM 40 MG PO TBEC: 40.0000 mg | DELAYED_RELEASE_TABLET | Freq: Every day | ORAL | 3 refills | Status: AC

## 2023-09-05 MED ORDER — ATORVASTATIN CALCIUM 80 MG PO TABS: 80.0000 mg | ORAL_TABLET | Freq: Every day | ORAL | 3 refills | Status: DC

## 2023-09-05 NOTE — Patient Instructions (Signed)
 [phone]   Dr. Evangeline Hilts [facility]). Call to set up colonoscopy.

## 2023-09-06 ENCOUNTER — Encounter: Payer: Self-pay | Admitting: Family Medicine

## 2023-09-25 ENCOUNTER — Telehealth: Payer: Self-pay

## 2023-09-25 DIAGNOSIS — I4729 Other ventricular tachycardia: Secondary | ICD-10-CM | POA: Diagnosis not present

## 2023-09-25 DIAGNOSIS — Z86018 Personal history of other benign neoplasm: Secondary | ICD-10-CM | POA: Diagnosis not present

## 2023-09-25 DIAGNOSIS — I5030 Unspecified diastolic (congestive) heart failure: Secondary | ICD-10-CM | POA: Diagnosis not present

## 2023-09-25 NOTE — Telephone Encounter (Signed)
 Primary Cardiologist:None  Chart reviewed as part of pre-operative protocol coverage. Because of Derrick [name] past medical history and time since last visit, he/she will require a follow-up visit in order to better assess preoperative cardiovascular risk.  Pre-op covering staff: - Please schedule appointment and call patient to inform them. - Please contact requesting surgeon's office via preferred method (i.e, phone, fax) to inform them of need for appointment prior to surgery.  Gerldine Koch, NP-C  09/25/2023, 11:43 AM [address], [address] [place], [place] [address] Office [phone] Fax [phone]

## 2023-09-25 NOTE — Telephone Encounter (Signed)
 I will update all parties involved pt has appt 10/02/23 with Michaelle Adolphus, PAC.

## 2023-09-25 NOTE — Telephone Encounter (Signed)
   Pre-operative Risk Assessment    Patient Name: Derrick [name]  DOB: 1951-05-03 MRN: [id number]   Date of last office visit: 06/18/23 Date of next office visit: n/a   Request for Surgical Clearance    Procedure:   Colonoscopy   Date of Surgery:  Clearance 10/24/23                                 Surgeon:  Dr. Evangeline Hilts  Surgeon's Group or Practice Name:  [facility]  Phone number:  [phone] Fax number:  [phone]   Type of Clearance Requested:   - Medical  - Pharmacy:  Hold Aspirin  Not indicated    Type of Anesthesia:   Propofol     Additional requests/questions:    SignedZulema Hitchcock   09/25/2023, 11:09 AM

## 2023-09-25 NOTE — Telephone Encounter (Signed)
 Will send a message to the EP schedulers to reach out to the pt with an appt as per preop APP, pt is overdue for f/u.   Pt needs preop clearance as well.

## 2023-09-25 NOTE — Telephone Encounter (Signed)
 Spoke w/ patient - he is scheduled to see Jonelle Neri, [place] on 5/13 for pre-op clearance.

## 2023-09-29 ENCOUNTER — Other Ambulatory Visit: Payer: Self-pay | Admitting: Family Medicine

## 2023-10-01 ENCOUNTER — Ambulatory Visit (INDEPENDENT_AMBULATORY_CARE_PROVIDER_SITE_OTHER): Payer: Medicare Other

## 2023-10-01 DIAGNOSIS — G459 Transient cerebral ischemic attack, unspecified: Secondary | ICD-10-CM

## 2023-10-01 LAB — CUP PACEART REMOTE DEVICE CHECK
Date Time Interrogation Session: 20250511234524
Implantable Pulse Generator Implant Date: 20240423

## 2023-10-01 NOTE — Progress Notes (Unsigned)
   Electrophysiology Office Note:   Date:  10/02/2023  ID:  Derrick [name], DOB April 18, 1951, MRN [id number]  Primary Cardiologist: None Electrophysiologist: Richardo Chandler, MD      History of Present Illness:   Derrick [name] is a 73 y.o. male with h/o NSVT, TIA, tachycardia, HTN, HFpEF, OSA, and obesity seen today for routine electrophysiology followup.   Since last being seen in our clinic the patient reports doing very well. Overall, he denies chest pain, palpitations, dyspnea, PND, orthopnea, nausea, vomiting, dizziness, syncope, edema, weight gain, or early satiety.   Review of systems complete and found to be negative unless listed in HPI.   Studies Reviewed:    EKG is ordered today. Personal review as below.  EKG Interpretation Date/Time:  Tuesday Oct 02 2023 08:45:47 EDT Ventricular Rate:  63 PR Interval:  190 QRS Duration:  90 QT Interval:  422 QTC Calculation: 431 R Axis:   -31  Text Interpretation: Normal sinus rhythm Left axis deviation Pulmonary disease pattern When compared with ECG of 15-Nov-2022 16:43, No significant change was found Confirmed by Pilar Bridge [id number]) on 10/02/2023 8:51:50 AM    Arrhythmia/Device History MDT ILR 09/12/2022 for h/o TIA   Physical Exam:   VS:  BP 110/66 (BP Location: Left Arm, Patient Position: Sitting, Cuff Size: Normal)   Pulse 63   Ht 5\' 8"  (1.727 m)   Wt 245 lb (111.1 kg)   BMI 37.25 kg/m    Wt Readings from Last 3 Encounters:  10/02/23 245 lb (111.1 kg)  09/05/23 245 lb 9.6 oz (111.4 kg)  03/07/23 237 lb 12.8 oz (107.9 kg)     GEN: No acute distress NECK: No JVD; No carotid bruits CARDIAC: Regular rate and rhythm, no murmurs, rubs, gallops RESPIRATORY:  Clear to auscultation without rales, wheezing or rhonchi  ABDOMEN: Soft, non-tender, non-distended EXTREMITIES:  No edema; No deformity   ILR Interrogation- reviewed in detail today,  See PACEART report  ASSESSMENT AND PLAN:    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   H/o NSVT cMRI 09/2022 with normal LVEF but two areas of LGE possible consistent with sarcoid PET scan 12/2022 negative for sarcoid  Cardiac clearance for colonoscopy No concerns from a cardiac perspective.  The patient is at low risk to proceed without further work up.  If the patient has new chest pain or SOB prior to surgery, they should be revaluated.    Follow up with EP APP in 12 months  Signed, Tylene Galla, PA-C

## 2023-10-02 ENCOUNTER — Ambulatory Visit: Attending: Student | Admitting: Student

## 2023-10-02 ENCOUNTER — Encounter: Payer: Self-pay | Admitting: Student

## 2023-10-02 VITALS — BP 110/66 | HR 63 | Ht 68.0 in | Wt 245.0 lb

## 2023-10-02 DIAGNOSIS — G459 Transient cerebral ischemic attack, unspecified: Secondary | ICD-10-CM | POA: Insufficient documentation

## 2023-10-02 DIAGNOSIS — I5032 Chronic diastolic (congestive) heart failure: Secondary | ICD-10-CM | POA: Diagnosis not present

## 2023-10-02 DIAGNOSIS — G4733 Obstructive sleep apnea (adult) (pediatric): Secondary | ICD-10-CM | POA: Diagnosis not present

## 2023-10-02 DIAGNOSIS — I4729 Other ventricular tachycardia: Secondary | ICD-10-CM | POA: Diagnosis not present

## 2023-10-02 DIAGNOSIS — Z01810 Encounter for preprocedural cardiovascular examination: Secondary | ICD-10-CM | POA: Insufficient documentation

## 2023-10-02 NOTE — Patient Instructions (Signed)
 Medication Instructions:  Your physician recommends that you continue on your current medications as directed. Please refer to the Current Medication list given to you today.  *If you need a refill on your cardiac medications before your next appointment, please call your pharmacy*  Lab Work: None ordered If you have labs (blood work) drawn today and your tests are completely normal, you will receive your results only by: MyChart Message (if you have MyChart) OR A paper copy in the mail If you have any lab test that is abnormal or we need to change your treatment, we will call you to review the results.  Follow-Up: At [facility], you and your health needs are our priority.  As part of our continuing mission to provide you with exceptional heart care, our providers are all part of one team.  This team includes your primary Cardiologist (physician) and Advanced Practice Providers or APPs (Physician Assistants and Nurse Practitioners) who all work together to provide you with the care you need, when you need it.  Your next appointment:   1 year(s)  Provider:   You will see one of the following Advanced Practice Providers on your designated Care Team:   Mertha Abrahams, [place] [address] [place], [place] Creighton Doffing, NP

## 2023-10-07 ENCOUNTER — Ambulatory Visit: Payer: Self-pay | Admitting: Cardiology

## 2023-10-08 NOTE — Progress Notes (Signed)
 Carelink Summary Report / Loop Recorder

## 2023-10-08 NOTE — Addendum Note (Signed)
 Addended by: Edra Govern D on: 10/08/2023 04:51 PM   Modules accepted: Orders

## 2023-10-11 ENCOUNTER — Telehealth: Payer: Self-pay

## 2023-10-11 NOTE — Telephone Encounter (Signed)
 Communication  Reason for CRM: patient would like for dana to give him a call  back regarding some swelling in ankle and foot he was jerk by the dog about a week ago he doesn't know rather he needs to come in or not   Pt scheduled for 5/23.

## 2023-10-11 NOTE — Telephone Encounter (Signed)
 noted

## 2023-10-12 ENCOUNTER — Ambulatory Visit (INDEPENDENT_AMBULATORY_CARE_PROVIDER_SITE_OTHER): Admitting: Family Medicine

## 2023-10-12 ENCOUNTER — Encounter: Payer: Self-pay | Admitting: Family Medicine

## 2023-10-12 VITALS — BP 109/64 | HR 72 | Temp 97.8°F | Ht 68.0 in | Wt 253.6 lb

## 2023-10-12 DIAGNOSIS — M25472 Effusion, left ankle: Secondary | ICD-10-CM

## 2023-10-12 DIAGNOSIS — S93402A Sprain of unspecified ligament of left ankle, initial encounter: Secondary | ICD-10-CM

## 2023-10-12 DIAGNOSIS — S82832A Other fracture of upper and lower end of left fibula, initial encounter for closed fracture: Secondary | ICD-10-CM | POA: Diagnosis not present

## 2023-10-12 DIAGNOSIS — M25572 Pain in left ankle and joints of left foot: Secondary | ICD-10-CM

## 2023-10-12 NOTE — Progress Notes (Unsigned)
 OFFICE VISIT  10/18/2023  CC:  Chief Complaint  Patient presents with   Ankle Swelling    Left ankle swollen since last Wed; redness, warm to touch, denies pain with pressure    Patient is a 73 y.o. male who presents for swollen left ankle.  HPI: 1 week ago he stumbled and twisted/turned his left ankle.  Had significant swelling afterwards, pain extended from the lateral aspect of the ankle over the lateral aspect of the left foot. He can walk on it but it is causing a significant limp. Interestingly, he says he sat out in the sun and got sunburn over the ankles and tops of the feet recently. Denies fever, chills, or malaise.  Past Medical History:  Diagnosis Date   BPH with obstruction/lower urinary tract symptoms    +Hx of acute urinary retention/acute cystitis: responding fairly well to tamsulosin  and finasteride  as of 09/2016 urol f/u.  Urolift and bladd bx 02/2018-->doing great as of 07/2019 urol f/u.   Deafness in right ear    Diastolic dysfunction    Erythrocytosis    stable dating back to 2009 (17-18 range). Iron normal. Epo normal.  Just following as long as stable as of 10/2020 (no hem/onc ref).  Dx'd severe OSA 09/2022   Fibula fracture    LEFT 09/2023   Gross hematuria    hematuria protocol w/u being done by urol as of their 01/23/18 note.   History of back injury    Back fracture fell from tree   History of erectile dysfunction    History of fracture of right ankle 05/2015   History of kidney stones    History of staph infection 10 yrs ago   after back surgery   HTN (hypertension)    Hyperlipidemia    Low back pain    Microhematuria    chronic; pt gets periodic cystoscopy for monitoring, most recent 07/30/20-->bx showed benign urothelium and submucosa, with eosinophilic cystitis.   Nephrolithiasis 2015   CT showed 2cm left sided stone which was stable since 2006 imaging and likely in renal parenchyma.  2.5 cm as of 08/2017 neph f/u: renal u/s 12/2017-->no hydro. KUB  showed stable L sided 18 mm stone. Cystoscopy 12/2017 erythematous mucosa at dome, no mass.    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  Annotation: previous methicillin sensitive  staphylococcus aureus 2006 Qualifier: Diagnosis of  By: Alwin Baars MD, Susana Enter  (ID Specialist)     Prostate cancer screening    via Urol--> PSA 1.39 April 2019. 1.36 Feb 2020.   TIA (transient ischemic attack)    03/2022.   Tinnitus, left ear     Past Surgical History:  Procedure Laterality Date   COLONOSCOPY  10/12/2015   repeat 5 years   COLONOSCOPY W/ POLYPECTOMY  2007   Negative 2012 and 2017; Dr Wyvonna Heidelberg 2022.   CYSTOSCOPY     with bladder biopsy:  dome of bladder with an area of chronic inflammation.  No malignancy.   CYSTOSCOPY WITH BIOPSY N/A 02/19/2018   Procedure: CYSTOSCOPY WITH BIOPSY/ FULGURATION / UROLIFT;  Surgeon: Christina Coyer, MD;  Location: [facility];  Service: Urology;  Laterality: N/A;   CYSTOSCOPY WITH FULGERATION N/A 07/30/2020   Bx benign. Procedure: CYSTOSCOPY WITH BLADDER BIOPSY/FULGERATION;  Surgeon: Christina Coyer,  MD;  Location: [facility];  Service: Urology;  Laterality: N/A;   CYSTOSCOPY WITH INSERTION OF UROLIFT  02/19/2018   LOOP RECORDER IMPLANTATION     08/2022   LUMBAR LAMINECTOMY     Dr Nigel Bart performed 2nd & 3rd procedures (pt also has hx of fall and vertebral fracture years prior to his surgeries.   TONSILLECTOMY  as child   TRANSTHORACIC ECHOCARDIOGRAM     03/2022 EF normal, grd I DD, mild AI, mod Pulm insuff    Outpatient Medications Prior to Visit  Medication Sig Dispense Refill   Ascorbic Acid (VITAMIN C) 1000 MG tablet Take 1,000 mg by mouth at bedtime.     aspirin  EC 81 MG tablet Take 1 tablet (81 mg total) by mouth daily.  Swallow whole. 30 tablet 11   atorvastatin  (LIPITOR) 80 MG tablet TAKE 1 TABLET BY MOUTH EVERY DAY 90 tablet 1   cloNIDine  (CATAPRES ) 0.1 MG tablet Take 1 tablet (0.1 mg total) by mouth daily as needed (Take once if systolic blood pressure over 170). 30 tablet 0   finasteride  (PROSCAR ) 5 MG tablet TAKE 1 TABLET BY MOUTH EVERYDAY AT BEDTIME 90 tablet 3   losartan -hydrochlorothiazide (HYZAAR) 100-12.5 MG tablet TAKE 1 TABLET BY MOUTH EVERY DAY 90 tablet 1   metoprolol  succinate (TOPROL -XL) 100 MG 24 hr tablet TAKE 1 TABLET BY MOUTH DAILY. TAKE WITH OR IMMEDIATELY FOLLOWING A MEAL. 90 tablet 1   pantoprazole  (PROTONIX ) 40 MG tablet Take 1 tablet (40 mg total) by mouth daily. 90 tablet 3   tadalafil  (CIALIS ) 10 MG tablet 1-2 tabs po qd prn.  Take approx 1 hour prior to intercourse 10 tablet 1   tamsulosin  (FLOMAX ) 0.4 MG CAPS capsule Take 1 capsule (0.4 mg total) by mouth daily. 90 capsule 3   No facility-administered medications prior to visit.    No Known Allergies  Review of Systems  As per HPI  PE:    10/12/2023    1:27 PM 10/02/2023    8:40 AM 09/05/2023    8:47 AM  Vitals with BMI  Height 5\' 8"  5\' 8"  5\' 8"   Weight 253 lbs 10 oz 245 lbs 245 lbs 10 oz  BMI 38.57 37.26 37.35  Systolic 109 110 91  Diastolic 64 66 56  Pulse 72 63 67     Physical Exam  General: Alert and well-appearing. Diffuse swelling over the left ankle and left foot.  No significant pitting edema.  He does have erythema over the tops of both feet and pretibial surfaces, left greater than right.  No warmth or streaking. Range of motion of the ankle intact but elicits some pain of the area just proximal to the lateral malleolus. No significant instability noted with anterior drawer or talar tilt.  Neurovascularly intact.  LABS:  Last metabolic panel Lab Results  Component Value Date   GLUCOSE 98 09/05/2023   NA 138 09/05/2023   K 4.4 09/05/2023   CL 100 09/05/2023   CO2 30 09/05/2023   BUN 16 09/05/2023    CREATININE 1.05 09/05/2023   GFR 69.74 09/05/2023   CALCIUM  9.1 09/05/2023   PHOS 2.6 06/19/2012   PROT 6.7 09/05/2023   ALBUMIN 4.2 09/05/2023   BILITOT 0.8 09/05/2023   ALKPHOS 71 09/05/2023   AST 18 09/05/2023   ALT 22 09/05/2023   ANIONGAP 10 04/30/2022   Lab Results  Component Value Date   HGBA1C 5.6 04/22/2022   IMPRESSION AND PLAN:  Acute left ankle injury/sprain. Additionally,  I can see a cortical step-off of the distal fibula on bedside MSK ultrasound today. He also has an ankle effusion.  Peroneal tendons fully intact and without surrounding fluid. Prescribed walking boot today. Will order plain radiograph of the left ankle and foot.  An After Visit Summary was printed and given to the patient.  FOLLOW UP: Return in about 2 weeks (around 10/26/2023) for f/u L ankle.  Signed:  Arletha Lady, MD           10/18/2023

## 2023-10-16 ENCOUNTER — Ambulatory Visit (HOSPITAL_BASED_OUTPATIENT_CLINIC_OR_DEPARTMENT_OTHER)
Admission: RE | Admit: 2023-10-16 | Discharge: 2023-10-16 | Disposition: A | Discharge status: Home / Self Care | Source: Ambulatory Visit | Attending: Family Medicine | Admitting: Family Medicine

## 2023-10-16 ENCOUNTER — Telehealth: Payer: Self-pay

## 2023-10-16 DIAGNOSIS — M25572 Pain in left ankle and joints of left foot: Secondary | ICD-10-CM | POA: Diagnosis not present

## 2023-10-16 DIAGNOSIS — M25472 Effusion, left ankle: Secondary | ICD-10-CM | POA: Diagnosis not present

## 2023-10-16 DIAGNOSIS — S82832A Other fracture of upper and lower end of left fibula, initial encounter for closed fracture: Secondary | ICD-10-CM

## 2023-10-16 DIAGNOSIS — S82892A Other fracture of left lower leg, initial encounter for closed fracture: Secondary | ICD-10-CM | POA: Diagnosis not present

## 2023-10-16 DIAGNOSIS — S82402A Unspecified fracture of shaft of left fibula, initial encounter for closed fracture: Secondary | ICD-10-CM | POA: Diagnosis not present

## 2023-10-16 NOTE — Telephone Encounter (Signed)
 Copied from CRM [id number]. Topic: Clinical - Request for Lab/Test Order >> Oct 16, 2023 10:01 AM El Gravely T wrote: Reason for CRM: Left ankle xray, orders in EPIC, needs appointment for Xray, per [facility], CRM sent to clinic for scheduling as appointment required for xrays

## 2023-10-17 NOTE — Telephone Encounter (Signed)
 Yes rx for [facility] is good. Pls do.

## 2023-10-17 NOTE — Telephone Encounter (Signed)
 Copied from CRM [id number]. Topic: Clinical - Order For Equipment >> Oct 17, 2023 11:42 AM Adonis Hoot wrote: Reason for CRM: Patient called in and stated that he received a prescription for a walking boot ,however he stated that everywhere he has called Is saying that he have to pay out of pocket for it. However if order is sent to [facility] his medicare will cover it.

## 2023-10-17 NOTE — Telephone Encounter (Signed)
 Ok to do rx to [facility] in [place]?   Phone: [phone] Fax: [phone]

## 2023-10-17 NOTE — Addendum Note (Signed)
 Addended by: Terris Fickle D on: 10/17/2023 03:33 PM   Modules accepted: Orders

## 2023-10-18 ENCOUNTER — Encounter: Payer: Self-pay | Admitting: Family Medicine

## 2023-10-18 ENCOUNTER — Ambulatory Visit: Payer: Self-pay | Admitting: Family Medicine

## 2023-10-18 NOTE — Telephone Encounter (Signed)
 Patient was notified rx would be completed and faxed to [facility].

## 2023-10-18 NOTE — Addendum Note (Signed)
 Addended by: Terris Fickle D on: 10/18/2023 10:07 AM   Modules accepted: Orders

## 2023-10-19 ENCOUNTER — Telehealth: Payer: Self-pay | Admitting: Family Medicine

## 2023-10-19 NOTE — Telephone Encounter (Signed)
 Copied from CRM [id number]. Topic: Clinical - Medication Refill >> Oct 19, 2023  2:45 PM Clyde Darling P wrote: Medication: atorvastatin  (LIPITOR) 80 MG tablet  Has the patient contacted their pharmacy? Yes- nomore refills (Agent: If no, request that the patient contact the pharmacy for the refill. If patient does not wish to contact the pharmacy document the reason why and proceed with request.) (Agent: If yes, when and what did the pharmacy advise?)  This is the patient's preferred pharmacy:  [id number] - [place], [place] - [address] AT [address] OF [address] [place] [address] Phone: [phone] Fax: [phone]  Is this the correct pharmacy for this prescription? Yes If no, delete pharmacy and type the correct one.   Has the prescription been filled recently? No  Is the patient out of the medication? Yes  Has the patient been seen for an appointment in the last year OR does the patient have an upcoming appointment? Yes  Can we respond through MyChart? Yes  Agent: Please be advised that Rx refills may take up to 3 business days. We ask that you follow-up with your pharmacy.

## 2023-10-25 ENCOUNTER — Other Ambulatory Visit: Payer: Self-pay | Admitting: Family Medicine

## 2023-10-26 ENCOUNTER — Encounter: Payer: Self-pay | Admitting: Family Medicine

## 2023-10-26 ENCOUNTER — Ambulatory Visit: Admitting: Family Medicine

## 2023-10-26 VITALS — BP 110/66 | HR 66 | Temp 97.9°F | Ht 68.0 in | Wt 252.6 lb

## 2023-10-26 DIAGNOSIS — S93402D Sprain of unspecified ligament of left ankle, subsequent encounter: Secondary | ICD-10-CM | POA: Diagnosis not present

## 2023-10-26 DIAGNOSIS — S82832D Other fracture of upper and lower end of left fibula, subsequent encounter for closed fracture with routine healing: Secondary | ICD-10-CM

## 2023-10-26 NOTE — Progress Notes (Signed)
 OFFICE VISIT  10/26/2023  CC:  Chief Complaint  Patient presents with   Follow-up    2 week f/u left ankle    Patient is a 73 y.o. male who presents for 2-week follow-up left ankle sprain and left fibula fracture. A/P as of last visit: "Acute left ankle injury/sprain. Additionally, I can see a cortical step-off of the distal fibula on bedside MSK ultrasound today. He also has an ankle effusion.  Peroneal tendons fully intact and without surrounding fluid. Prescribed walking boot today. Will order plain radiograph of the left ankle and foot."  INTERIM HX: His ankle radiograph showed a mildly displaced distal fibular fracture at the level of the ankle mortise.  He is wearing his boot. No significant pain in the lower leg or ankle.  His Achilles feels a little sore near its attachment to the calcaneus.    Past Medical History:  Diagnosis Date   BPH with obstruction/lower urinary tract symptoms    +Hx of acute urinary retention/acute cystitis: responding fairly well to tamsulosin  and finasteride  as of 09/2016 urol f/u.  Urolift and bladd bx 02/2018-->doing great as of 07/2019 urol f/u.   Deafness in right ear    Diastolic dysfunction    Erythrocytosis    stable dating back to 2009 (17-18 range). Iron normal. Epo normal.  Just following as long as stable as of 10/2020 (no hem/onc ref).  Dx'd severe OSA 09/2022   Fibula fracture    LEFT 09/2023   Gross hematuria    hematuria protocol w/u being done by urol as of their 01/23/18 note.   History of back injury    Back fracture fell from tree   History of erectile dysfunction    History of fracture of right ankle 05/2015   History of kidney stones    History of staph infection 10 yrs ago   after back surgery   HTN (hypertension)    Hyperlipidemia    Low back pain    Microhematuria    chronic; pt gets periodic cystoscopy for monitoring, most recent 07/30/20-->bx showed benign urothelium and submucosa, with eosinophilic cystitis.    Nephrolithiasis 2015   CT showed 2cm left sided stone which was stable since 2006 imaging and likely in renal parenchyma.  2.5 cm as of 08/2017 neph f/u: renal u/s 12/2017-->no hydro. KUB showed stable L sided 18 mm stone. Cystoscopy 12/2017 erythematous mucosa at dome, no mass.    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  Annotation: previous methicillin sensitive  staphylococcus aureus 2006 Qualifier: Diagnosis of  By: Alwin Baars MD, Susana Enter  (ID Specialist)     Prostate cancer screening    via Urol--> PSA 1.39 April 2019. 1.36 Feb 2020.   TIA (transient ischemic attack)    03/2022.   Tinnitus, left ear     Past Surgical History:  Procedure Laterality Date   COLONOSCOPY  10/12/2015   repeat 5 years   COLONOSCOPY W/ POLYPECTOMY  2007   Negative 2012 and 2017; Dr Wyvonna Heidelberg 2022.   CYSTOSCOPY     with bladder biopsy:  dome of bladder with an area of chronic inflammation.  No malignancy.   CYSTOSCOPY WITH BIOPSY N/A 02/19/2018   Procedure: CYSTOSCOPY WITH BIOPSY/ FULGURATION / UROLIFT;  Surgeon: Christina Coyer, MD;  Location:  [facility];  Service: Urology;  Laterality: N/A;   CYSTOSCOPY WITH FULGERATION N/A 07/30/2020   Bx benign. Procedure: CYSTOSCOPY WITH BLADDER BIOPSY/FULGERATION;  Surgeon: Christina Coyer, MD;  Location: [facility];  Service: Urology;  Laterality: N/A;   CYSTOSCOPY WITH INSERTION OF UROLIFT  02/19/2018   LOOP RECORDER IMPLANTATION     08/2022   LUMBAR LAMINECTOMY     Dr Nigel Bart performed 2nd & 3rd procedures (pt also has hx of fall and vertebral fracture years prior to his surgeries.   TONSILLECTOMY  as child   TRANSTHORACIC ECHOCARDIOGRAM     03/2022 EF normal, grd I DD, mild AI, mod Pulm insuff    Outpatient Medications Prior to Visit   Medication Sig Dispense Refill   Ascorbic Acid (VITAMIN C) 1000 MG tablet Take 1,000 mg by mouth at bedtime.     aspirin  EC 81 MG tablet Take 1 tablet (81 mg total) by mouth daily. Swallow whole. 30 tablet 11   atorvastatin  (LIPITOR) 80 MG tablet TAKE 1 TABLET BY MOUTH EVERY DAY 90 tablet 1   cloNIDine  (CATAPRES ) 0.1 MG tablet Take 1 tablet (0.1 mg total) by mouth daily as needed (Take once if systolic blood pressure over 170). 30 tablet 0   finasteride  (PROSCAR ) 5 MG tablet TAKE 1 TABLET BY MOUTH EVERYDAY AT BEDTIME 30 tablet 0   losartan -hydrochlorothiazide (HYZAAR) 100-12.5 MG tablet TAKE 1 TABLET BY MOUTH EVERY DAY 90 tablet 1   metoprolol  succinate (TOPROL -XL) 100 MG 24 hr tablet TAKE 1 TABLET BY MOUTH DAILY. TAKE WITH OR IMMEDIATELY FOLLOWING A MEAL. 90 tablet 1   pantoprazole  (PROTONIX ) 40 MG tablet Take 1 tablet (40 mg total) by mouth daily. 90 tablet 3   tadalafil  (CIALIS ) 10 MG tablet 1-2 tabs po qd prn.  Take approx 1 hour prior to intercourse 10 tablet 1   tamsulosin  (FLOMAX ) 0.4 MG CAPS capsule Take 1 capsule (0.4 mg total) by mouth daily. 90 capsule 3   [employer] GENTLE LAXATIVE 5 MG EC tablet Take by mouth as directed. (Patient not taking: Reported on 10/26/2023)     polyethylene glycol-electrolytes (NULYTELY) 420 g solution as directed. (Patient not taking: Reported on 10/26/2023)     No facility-administered medications prior to visit.    No Known Allergies  Review of Systems As per HPI  PE:    10/26/2023    9:18 AM 10/12/2023    1:27 PM 10/02/2023    8:40 AM  Vitals with BMI  Height 5\' 8"  5\' 8"  5\' 8"   Weight 252 lbs 10 oz 253 lbs 10 oz 245 lbs  BMI 38.42 38.57 37.26  Systolic 110 109 161  Diastolic 66 64 66  Pulse 66 72 63   Physical Exam  General: Alert and well-appearing. Ankle and foot with generalized edema.  He has full flexion, extension, eversion, and inversion at the ankle.  No significant tenderness to palpation anywhere over the ankle except the distal  tibiofibular region.  No erythema or warmth.  Achilles tendon without tenderness.  He has no pain with resisted ankle flexion.  LABS:  None  IMPRESSION AND PLAN:  Left distal fibular fracture, mildly displaced.  Improving. He does have a significant ankle sprain--> bedside MSK ultrasound today showed significant disruption of both the anterior tibiofibular and anterior talofibular ligaments.  He does still have a mild ankle effusion.  Peroneal tendons fully intact, normal insertion of the peroneus brevis on the base of the fifth metatarsal.  Plan: Needs to wear the boot  4 weeks total. Follow-up in office 3 weeks. Repeat x-ray 2 weeks.  An After Visit Summary was printed and given to the patient.  FOLLOW UP: Return in about 3 weeks (around 11/16/2023) for f/u fibula fracture.  Signed:  Arletha Lady, MD           10/26/2023

## 2023-10-30 DIAGNOSIS — D120 Benign neoplasm of cecum: Secondary | ICD-10-CM | POA: Diagnosis not present

## 2023-10-30 DIAGNOSIS — Z860101 Personal history of adenomatous and serrated colon polyps: Secondary | ICD-10-CM | POA: Diagnosis not present

## 2023-10-30 DIAGNOSIS — Z09 Encounter for follow-up examination after completed treatment for conditions other than malignant neoplasm: Secondary | ICD-10-CM | POA: Diagnosis not present

## 2023-10-30 DIAGNOSIS — K648 Other hemorrhoids: Secondary | ICD-10-CM | POA: Diagnosis not present

## 2023-11-01 ENCOUNTER — Ambulatory Visit (INDEPENDENT_AMBULATORY_CARE_PROVIDER_SITE_OTHER)

## 2023-11-01 DIAGNOSIS — G459 Transient cerebral ischemic attack, unspecified: Secondary | ICD-10-CM | POA: Diagnosis not present

## 2023-11-01 LAB — CUP PACEART REMOTE DEVICE CHECK
Date Time Interrogation Session: 20250611233446
Implantable Pulse Generator Implant Date: 20240423

## 2023-11-02 DIAGNOSIS — D120 Benign neoplasm of cecum: Secondary | ICD-10-CM | POA: Diagnosis not present

## 2023-11-03 ENCOUNTER — Ambulatory Visit: Payer: Self-pay | Admitting: Cardiology

## 2023-11-08 ENCOUNTER — Encounter: Payer: Self-pay | Admitting: Family Medicine

## 2023-11-08 ENCOUNTER — Ambulatory Visit: Admitting: Family Medicine

## 2023-11-08 VITALS — BP 111/73 | HR 74 | Ht 68.0 in | Wt 253.2 lb

## 2023-11-08 DIAGNOSIS — S82832D Other fracture of upper and lower end of left fibula, subsequent encounter for closed fracture with routine healing: Secondary | ICD-10-CM

## 2023-11-08 DIAGNOSIS — S93402D Sprain of unspecified ligament of left ankle, subsequent encounter: Secondary | ICD-10-CM

## 2023-11-08 NOTE — Progress Notes (Signed)
 OFFICE VISIT  11/08/2023  CC:  Chief Complaint  Patient presents with   Follow-up    Left ankle    Patient is a 73 y.o. male who presents for 2-week follow-up left ankle sprain with fibula fracture. A/P as of last visit: Left distal fibular fracture, mildly displaced.  Improving. He does have a significant ankle sprain--> bedside MSK ultrasound today showed significant disruption of both the anterior tibiofibular and anterior talofibular ligaments.  He does still have a mild ankle effusion.  Peroneal tendons fully intact, normal insertion of the peroneus brevis on the base of the fifth metatarsal.   Plan: Needs to wear the boot 4 weeks total. Follow-up in office 3 weeks. Repeat x-ray 2 weeks.  INTERIM HX: Continues to improve.  Just a little bit of swelling through the day, goes down at night.  He is continuing with icing regimen.  He is wearing his boot appropriately. Notes the Achilles area aching some constantly, 3 out of 10 intensity.  Past Medical History:  Diagnosis Date   BPH with obstruction/lower urinary tract symptoms    +Hx of acute urinary retention/acute cystitis: responding fairly well to tamsulosin  and finasteride  as of 09/2016 urol f/u.  Urolift and bladd bx 02/2018-->doing great as of 07/2019 urol f/u.   Deafness in right ear    Diastolic dysfunction    Erythrocytosis    stable dating back to 2009 (17-18 range). Iron normal. Epo normal.  Just following as long as stable as of 10/2020 (no hem/onc ref).  Dx'd severe OSA 09/2022   Fibula fracture    LEFT 09/2023   Gross hematuria    hematuria protocol w/u being done by urol as of their 01/23/18 note.   History of back injury    Back fracture fell from tree   History of erectile dysfunction    History of fracture of right ankle 05/2015   History of kidney stones    History of staph infection 10 yrs ago   after back surgery   HTN (hypertension)    Hyperlipidemia    Low back pain    Microhematuria    chronic; pt  gets periodic cystoscopy for monitoring, most recent 07/30/20-->bx showed benign urothelium and submucosa, with eosinophilic cystitis.   Nephrolithiasis 2015   CT showed 2cm left sided stone which was stable since 2006 imaging and likely in renal parenchyma.  2.5 cm as of 08/2017 neph f/u: renal u/s 12/2017-->no hydro. KUB showed stable L sided 18 mm stone. Cystoscopy 12/2017 erythematous mucosa at dome, no mass.    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  Annotation: previous methicillin sensitive  staphylococcus aureus 2006 Qualifier: Diagnosis of  By: Alwin Baars MD, Susana Enter  (ID Specialist)     Prostate cancer screening    via Urol--> PSA 1.39 April 2019. 1.36 Feb 2020.   TIA (transient ischemic attack)    03/2022.   Tinnitus, left ear     Past Surgical History:  Procedure Laterality Date   COLONOSCOPY  10/12/2015   repeat 5 years   COLONOSCOPY W/ POLYPECTOMY  2007   Negative 2012 and 2017; Dr Wyvonna Heidelberg 2022.   CYSTOSCOPY     with bladder biopsy:  dome of bladder with an area of chronic inflammation.  No malignancy.  CYSTOSCOPY WITH BIOPSY N/A 02/19/2018   Procedure: CYSTOSCOPY WITH BIOPSY/ FULGURATION / UROLIFT;  Surgeon: Christina Coyer, MD;  Location: [facility];  Service: Urology;  Laterality: N/A;   CYSTOSCOPY WITH FULGERATION N/A 07/30/2020   Bx benign. Procedure: CYSTOSCOPY WITH BLADDER BIOPSY/FULGERATION;  Surgeon: Christina Coyer, MD;  Location: [facility];  Service: Urology;  Laterality: N/A;   CYSTOSCOPY WITH INSERTION OF UROLIFT  02/19/2018   LOOP RECORDER IMPLANTATION     08/2022   LUMBAR LAMINECTOMY     Dr Nigel Bart performed 2nd & 3rd procedures (pt also has hx of fall and vertebral fracture years prior to his surgeries.   TONSILLECTOMY  as  child   TRANSTHORACIC ECHOCARDIOGRAM     03/2022 EF normal, grd I DD, mild AI, mod Pulm insuff    Outpatient Medications Prior to Visit  Medication Sig Dispense Refill   Ascorbic Acid (VITAMIN C) 1000 MG tablet Take 1,000 mg by mouth at bedtime.     aspirin  EC 81 MG tablet Take 1 tablet (81 mg total) by mouth daily. Swallow whole. 30 tablet 11   atorvastatin  (LIPITOR) 80 MG tablet TAKE 1 TABLET BY MOUTH EVERY DAY 90 tablet 1   cloNIDine  (CATAPRES ) 0.1 MG tablet Take 1 tablet (0.1 mg total) by mouth daily as needed (Take once if systolic blood pressure over 170). 30 tablet 0   finasteride  (PROSCAR ) 5 MG tablet TAKE 1 TABLET BY MOUTH EVERYDAY AT BEDTIME 30 tablet 0   losartan -hydrochlorothiazide (HYZAAR) 100-12.5 MG tablet TAKE 1 TABLET BY MOUTH EVERY DAY 90 tablet 1   metoprolol  succinate (TOPROL -XL) 100 MG 24 hr tablet TAKE 1 TABLET BY MOUTH DAILY. TAKE WITH OR IMMEDIATELY FOLLOWING A MEAL. 90 tablet 1   pantoprazole  (PROTONIX ) 40 MG tablet Take 1 tablet (40 mg total) by mouth daily. 90 tablet 3   tadalafil  (CIALIS ) 10 MG tablet 1-2 tabs po qd prn.  Take approx 1 hour prior to intercourse 10 tablet 1   tamsulosin  (FLOMAX ) 0.4 MG CAPS capsule Take 1 capsule (0.4 mg total) by mouth daily. 90 capsule 3   [employer]S GENTLE LAXATIVE 5 MG EC tablet Take by mouth as directed. (Patient not taking: Reported on 11/08/2023)     polyethylene glycol-electrolytes (NULYTELY) 420 g solution as directed. (Patient not taking: Reported on 11/08/2023)     No facility-administered medications prior to visit.    No Known Allergies  Review of Systems As per HPI  PE:    11/08/2023    8:37 AM 10/26/2023    9:18 AM 10/12/2023    1:27 PM  Vitals with BMI  Height 5' 8 5' 8 5' 8  Weight 253 lbs 3 oz 252 lbs 10 oz 253 lbs 10 oz  BMI 38.51 38.42 38.57  Systolic 111 110 782  Diastolic 73 66 64  Pulse 74 66 72     Physical Exam  Gen: Alert, well appearing.  Patient is oriented to person, place, time, and  situation. Left ankle with mild soft tissue swelling.  No warmth or erythema. Mild tenderness to palpation around the lateral foot and ankle.  Active flexion, extension, eversion, and inversion of the ankle is strong and only elicits a little bit of pain in the lateral ankle. I did not test ankle stability today. Achilles nontender.  LABS:  None  IMPRESSION AND PLAN:  Left ankle sprain with distal fibula fracture. He continues to improve with rest, walking boot, and icing. Plan is repeat x-ray to check for healing in 1  week.  Continue boot until that time as well. Home ankle rehab exercises were given today.  An After Visit Summary was printed and given to the patient.  FOLLOW UP: No follow-ups on file. He has routine follow-up appointment set for 03/06/2024.  Signed:  Arletha Lady, MD           11/08/2023

## 2023-11-08 NOTE — Patient Instructions (Signed)
 Go to [facility] on [address] in 1 week to get your ankle x-ray. Wear your boot until you get your x-ray.  Then you can start doing home PT exercises.

## 2023-11-12 ENCOUNTER — Ambulatory Visit (HOSPITAL_BASED_OUTPATIENT_CLINIC_OR_DEPARTMENT_OTHER)
Admission: RE | Admit: 2023-11-12 | Discharge: 2023-11-12 | Disposition: A | Discharge status: Home / Self Care | Source: Ambulatory Visit | Attending: Family Medicine | Admitting: Family Medicine

## 2023-11-12 ENCOUNTER — Ambulatory Visit: Payer: Self-pay | Admitting: Family Medicine

## 2023-11-12 DIAGNOSIS — M25472 Effusion, left ankle: Secondary | ICD-10-CM | POA: Insufficient documentation

## 2023-11-12 DIAGNOSIS — M25572 Pain in left ankle and joints of left foot: Secondary | ICD-10-CM | POA: Diagnosis not present

## 2023-11-12 DIAGNOSIS — S82832G Other fracture of upper and lower end of left fibula, subsequent encounter for closed fracture with delayed healing: Secondary | ICD-10-CM

## 2023-11-12 DIAGNOSIS — S82402A Unspecified fracture of shaft of left fibula, initial encounter for closed fracture: Secondary | ICD-10-CM | POA: Diagnosis not present

## 2023-11-12 DIAGNOSIS — S82832A Other fracture of upper and lower end of left fibula, initial encounter for closed fracture: Secondary | ICD-10-CM | POA: Diagnosis not present

## 2023-11-14 NOTE — Progress Notes (Signed)
 Carelink Summary Report / Loop Recorder

## 2023-11-16 ENCOUNTER — Ambulatory Visit: Admitting: Family Medicine

## 2023-11-22 ENCOUNTER — Ambulatory Visit: Admitting: Family Medicine

## 2023-11-26 ENCOUNTER — Telehealth: Payer: Self-pay | Admitting: Student

## 2023-11-26 ENCOUNTER — Encounter: Payer: Self-pay | Admitting: Orthopaedic Surgery

## 2023-11-26 ENCOUNTER — Other Ambulatory Visit: Payer: Self-pay | Admitting: Orthopaedic Surgery

## 2023-11-26 ENCOUNTER — Other Ambulatory Visit

## 2023-11-26 DIAGNOSIS — S82832A Other fracture of upper and lower end of left fibula, initial encounter for closed fracture: Secondary | ICD-10-CM

## 2023-11-26 DIAGNOSIS — M25572 Pain in left ankle and joints of left foot: Secondary | ICD-10-CM | POA: Diagnosis not present

## 2023-11-26 NOTE — Telephone Encounter (Signed)
 Pt is to have a MRI and CT done with [facility]. He states they need to know if loop recorder is compatible. Called device clinic and they said it was but pt states they need it in writing. Can something be faxed over to them    [address] [place], [place] [address]  Fax [phone]

## 2023-11-26 NOTE — Telephone Encounter (Signed)
 Letter faxed to [facility]. Pt called to update to plan. Was appreciative of call.

## 2023-11-27 ENCOUNTER — Ambulatory Visit: Admitting: Family Medicine

## 2023-11-27 ENCOUNTER — Ambulatory Visit
Admission: RE | Admit: 2023-11-27 | Discharge: 2023-11-27 | Disposition: A | Discharge status: Home / Self Care | Source: Ambulatory Visit | Attending: Orthopaedic Surgery | Admitting: Orthopaedic Surgery

## 2023-11-27 ENCOUNTER — Encounter: Payer: Self-pay | Admitting: Family Medicine

## 2023-11-27 ENCOUNTER — Ambulatory Visit
Admission: RE | Admit: 2023-11-27 | Discharge: 2023-11-27 | Disposition: A | Discharge status: Home / Self Care | Source: Ambulatory Visit | Attending: Orthopaedic Surgery

## 2023-11-27 VITALS — BP 113/65 | HR 72 | Temp 97.8°F | Ht 68.0 in | Wt 256.0 lb

## 2023-11-27 DIAGNOSIS — S82832G Other fracture of upper and lower end of left fibula, subsequent encounter for closed fracture with delayed healing: Secondary | ICD-10-CM | POA: Diagnosis not present

## 2023-11-27 DIAGNOSIS — M25572 Pain in left ankle and joints of left foot: Secondary | ICD-10-CM | POA: Diagnosis not present

## 2023-11-27 DIAGNOSIS — R600 Localized edema: Secondary | ICD-10-CM | POA: Diagnosis not present

## 2023-11-27 DIAGNOSIS — S82832A Other fracture of upper and lower end of left fibula, initial encounter for closed fracture: Secondary | ICD-10-CM

## 2023-11-27 DIAGNOSIS — M25462 Effusion, left knee: Secondary | ICD-10-CM | POA: Diagnosis not present

## 2023-11-27 DIAGNOSIS — Z01818 Encounter for other preprocedural examination: Secondary | ICD-10-CM | POA: Diagnosis not present

## 2023-11-27 DIAGNOSIS — M76822 Posterior tibial tendinitis, left leg: Secondary | ICD-10-CM | POA: Diagnosis not present

## 2023-11-27 DIAGNOSIS — M19072 Primary osteoarthritis, left ankle and foot: Secondary | ICD-10-CM | POA: Diagnosis not present

## 2023-11-27 NOTE — Progress Notes (Signed)
 OFFICE VISIT  11/27/2023  CC:  Chief Complaint  Patient presents with   Follow-up    2 week f/u left ankle; still hurts    Patient is a 73 y.o. male who presents for 2-week follow-up left fibula fracture. A follow-up radiograph of his left ankle on 11/12/2023 showed unchanged alignment of distal fibular fracture without peripheral or internal callus formation.  I referred him to orthopedics at that time. MRI of the left ankle without contrast is ordered/planned but not scheduled.  He has a CT ankle without contrast done earlier today.  INTERIM HX: He saw orthopedist yesterday. CT scan and MRI scan of the left ankle were ordered. Transsyndesmotic fracture of the distal fibula was noted on the CT scan this morning, similar alignment to prior x-ray, partially bridging marginal callus along the proximal posterior fracture portion. MRI is scheduled for 1 this afternoon. Eleanor wonders if we can contact the orthopedist and ask if the MRI is still necessary since CT scan showed the syndesmotic injury.  Orthopedics gave him a soft compression ankle brace to wear as an alternative to his walking boot.  Very says the pain is mild and he pushes through pretty well.   Past Medical History:  Diagnosis Date   BPH with obstruction/lower urinary tract symptoms    +Hx of acute urinary retention/acute cystitis: responding fairly well to tamsulosin  and finasteride  as of 09/2016 urol f/u.  Urolift and bladd bx 02/2018-->doing great as of 07/2019 urol f/u.   Deafness in right ear    Diastolic dysfunction    Erythrocytosis    stable dating back to 2009 (17-18 range). Iron normal. Epo normal.  Just following as long as stable as of 10/2020 (no hem/onc ref).  Dx'd severe OSA 09/2022   Fibula fracture    LEFT 09/2023   Gross hematuria    hematuria protocol w/u being done by urol as of their 01/23/18 note.   History of back injury    Back fracture fell from tree   History of erectile dysfunction    History of  fracture of right ankle 05/2015   History of kidney stones    History of staph infection 10 yrs ago   after back surgery   HTN (hypertension)    Hyperlipidemia    Low back pain    Microhematuria    chronic; pt gets periodic cystoscopy for monitoring, most recent 07/30/20-->bx showed benign urothelium and submucosa, with eosinophilic cystitis.   Nephrolithiasis 2015   CT showed 2cm left sided stone which was stable since 2006 imaging and likely in renal parenchyma.  2.5 cm as of 08/2017 neph f/u: renal u/s 12/2017-->no hydro. KUB showed stable L sided 18 mm stone. Cystoscopy 12/2017 erythematous mucosa at dome, no mass.    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  Annotation: previous methicillin sensitive  staphylococcus aureus 2006 Qualifier: Diagnosis of  By: Eben MD, Reyes  (ID Specialist)     Prostate cancer screening    via Urol--> PSA 1.39 April 2019. 1.36 Feb 2020.   TIA (transient ischemic attack)    03/2022.   Tinnitus, left ear     Past Surgical History:  Procedure Laterality Date   COLONOSCOPY  10/12/2015   repeat 5 years   COLONOSCOPY W/ POLYPECTOMY  2007   Negative 2012 and 2017; Dr Madelyne 2022.   CYSTOSCOPY     with bladder biopsy:  dome of bladder with an area of chronic inflammation.  No malignancy.   CYSTOSCOPY WITH BIOPSY N/A 02/19/2018   Procedure: CYSTOSCOPY WITH BIOPSY/ FULGURATION / UROLIFT;  Surgeon: Nieves Cough, MD;  Location: [facility];  Service: Urology;  Laterality: N/A;   CYSTOSCOPY WITH FULGERATION N/A 07/30/2020   Bx benign. Procedure: CYSTOSCOPY WITH BLADDER BIOPSY/FULGERATION;  Surgeon: Nieves Cough, MD;  Location: [facility];  Service: Urology;  Laterality: N/A;   CYSTOSCOPY WITH INSERTION OF UROLIFT   02/19/2018   LOOP RECORDER IMPLANTATION     08/2022   LUMBAR LAMINECTOMY     Dr Unice performed 2nd & 3rd procedures (pt also has hx of fall and vertebral fracture years prior to his surgeries.   TONSILLECTOMY  as child   TRANSTHORACIC ECHOCARDIOGRAM     03/2022 EF normal, grd I DD, mild AI, mod Pulm insuff    Outpatient Medications Prior to Visit  Medication Sig Dispense Refill   Ascorbic Acid (VITAMIN C) 1000 MG tablet Take 1,000 mg by mouth at bedtime.     aspirin  EC 81 MG tablet Take 1 tablet (81 mg total) by mouth daily. Swallow whole. 30 tablet 11   atorvastatin  (LIPITOR) 80 MG tablet TAKE 1 TABLET BY MOUTH EVERY DAY 90 tablet 1   cloNIDine  (CATAPRES ) 0.1 MG tablet Take 1 tablet (0.1 mg total) by mouth daily as needed (Take once if systolic blood pressure over 170). 30 tablet 0   finasteride  (PROSCAR ) 5 MG tablet TAKE 1 TABLET BY MOUTH EVERYDAY AT BEDTIME 30 tablet 0   losartan -hydrochlorothiazide (HYZAAR) 100-12.5 MG tablet TAKE 1 TABLET BY MOUTH EVERY DAY 90 tablet 1   metoprolol  succinate (TOPROL -XL) 100 MG 24 hr tablet TAKE 1 TABLET BY MOUTH DAILY. TAKE WITH OR IMMEDIATELY FOLLOWING A MEAL. 90 tablet 1   pantoprazole  (PROTONIX ) 40 MG tablet Take 1 tablet (40 mg total) by mouth daily. 90 tablet 3   tadalafil  (CIALIS ) 10 MG tablet 1-2 tabs po qd prn.  Take approx 1 hour prior to intercourse 10 tablet 1   tamsulosin  (FLOMAX ) 0.4 MG CAPS capsule Take 1 capsule (0.4 mg total) by mouth daily. 90 capsule 3   [employer] GENTLE LAXATIVE 5 MG EC tablet Take by mouth as directed. (Patient not taking: Reported on 11/27/2023)     polyethylene glycol-electrolytes (NULYTELY) 420 g solution as directed. (Patient not taking: Reported on 11/27/2023)     No facility-administered medications prior to visit.    No Known Allergies  Review of Systems As per HPI  PE:    11/27/2023    9:00 AM 11/08/2023    8:37 AM 10/26/2023    9:18 AM  Vitals with BMI  Height 5' 8 5' 8 5' 8  Weight 256 lbs 253 lbs 3 oz  252 lbs 10 oz  BMI 38.93 38.51 38.42  Systolic 113 111 889  Diastolic 65 73 66  Pulse 72 74 66     Physical Exam  Gen: Alert, well appearing.  Patient is oriented to person, place, time, and situation. AFFECT: pleasant, lucid thought and speech. No further exam today.  LABS:   Last metabolic panel Lab Results  Component Value Date   GLUCOSE 98 09/05/2023   NA 138 09/05/2023   K 4.4 09/05/2023   CL 100 09/05/2023   CO2 30 09/05/2023   BUN 16 09/05/2023   CREATININE 1.05 09/05/2023  GFR 69.74 09/05/2023   CALCIUM  9.1 09/05/2023   PHOS 2.6 06/19/2012   PROT 6.7 09/05/2023   ALBUMIN 4.2 09/05/2023   BILITOT 0.8 09/05/2023   ALKPHOS 71 09/05/2023   AST 18 09/05/2023   ALT 22 09/05/2023   ANIONGAP 10 04/30/2022   Last hemoglobin A1c Lab Results  Component Value Date   HGBA1C 5.6 04/22/2022   IMPRESSION AND PLAN:  Left ankle transsyndesmotic fibular fracture, partial healing noted. His injury was over 6 weeks ago. Orthopedist is following him now, imaging as noted in HPI above. As per [facility] request we will call orthopedist office and inquire whether the MRI is still needed this afternoon.  An After Visit Summary was printed and given to the patient.  FOLLOW UP: Return for To be determined based on specialist care.  Signed:  Gerlene Hockey, MD           11/27/2023

## 2023-12-03 ENCOUNTER — Ambulatory Visit: Payer: Self-pay | Admitting: Cardiology

## 2023-12-03 ENCOUNTER — Ambulatory Visit

## 2023-12-03 DIAGNOSIS — G459 Transient cerebral ischemic attack, unspecified: Secondary | ICD-10-CM | POA: Diagnosis not present

## 2023-12-03 LAB — CUP PACEART REMOTE DEVICE CHECK
Date Time Interrogation Session: 20250713235845
Implantable Pulse Generator Implant Date: 20240423

## 2023-12-25 NOTE — Progress Notes (Signed)
 Carelink Summary Report / Loop Recorder

## 2024-01-03 ENCOUNTER — Ambulatory Visit (INDEPENDENT_AMBULATORY_CARE_PROVIDER_SITE_OTHER)

## 2024-01-03 ENCOUNTER — Ambulatory Visit: Payer: Self-pay | Admitting: Cardiology

## 2024-01-03 DIAGNOSIS — G459 Transient cerebral ischemic attack, unspecified: Secondary | ICD-10-CM

## 2024-01-03 LAB — CUP PACEART REMOTE DEVICE CHECK
Date Time Interrogation Session: 20250813233911
Implantable Pulse Generator Implant Date: 20240423

## 2024-01-30 ENCOUNTER — Other Ambulatory Visit: Payer: Self-pay | Admitting: Internal Medicine

## 2024-02-04 ENCOUNTER — Ambulatory Visit (INDEPENDENT_AMBULATORY_CARE_PROVIDER_SITE_OTHER)

## 2024-02-04 DIAGNOSIS — G459 Transient cerebral ischemic attack, unspecified: Secondary | ICD-10-CM | POA: Diagnosis not present

## 2024-02-05 LAB — CUP PACEART REMOTE DEVICE CHECK
Date Time Interrogation Session: 20250914234215
Implantable Pulse Generator Implant Date: 20240423

## 2024-02-07 ENCOUNTER — Ambulatory Visit: Payer: Self-pay | Admitting: Cardiology

## 2024-02-07 ENCOUNTER — Other Ambulatory Visit: Payer: Self-pay | Admitting: Internal Medicine

## 2024-02-11 NOTE — Progress Notes (Signed)
 Remote Loop Recorder Transmission

## 2024-02-14 NOTE — Progress Notes (Signed)
 Remote Loop Recorder Transmission

## 2024-02-27 NOTE — Progress Notes (Signed)
 Remote Loop Recorder Transmission

## 2024-03-05 ENCOUNTER — Ambulatory Visit (INDEPENDENT_AMBULATORY_CARE_PROVIDER_SITE_OTHER)

## 2024-03-05 DIAGNOSIS — G459 Transient cerebral ischemic attack, unspecified: Secondary | ICD-10-CM

## 2024-03-06 ENCOUNTER — Encounter

## 2024-03-06 ENCOUNTER — Encounter: Payer: Self-pay | Admitting: Family Medicine

## 2024-03-06 ENCOUNTER — Ambulatory Visit: Payer: Self-pay | Admitting: Cardiology

## 2024-03-06 ENCOUNTER — Ambulatory Visit (INDEPENDENT_AMBULATORY_CARE_PROVIDER_SITE_OTHER): Admitting: Family Medicine

## 2024-03-06 VITALS — BP 101/65 | HR 72 | Temp 97.6°F | Ht 68.0 in | Wt 258.6 lb

## 2024-03-06 DIAGNOSIS — D751 Secondary polycythemia: Secondary | ICD-10-CM

## 2024-03-06 DIAGNOSIS — E7800 Pure hypercholesterolemia, unspecified: Secondary | ICD-10-CM | POA: Diagnosis not present

## 2024-03-06 DIAGNOSIS — I10 Essential (primary) hypertension: Secondary | ICD-10-CM

## 2024-03-06 DIAGNOSIS — Z23 Encounter for immunization: Secondary | ICD-10-CM | POA: Diagnosis not present

## 2024-03-06 LAB — COMPREHENSIVE METABOLIC PANEL WITH GFR
ALT: 24 U/L (ref 0–53)
AST: 17 U/L (ref 0–37)
Albumin: 4 g/dL (ref 3.5–5.2)
Alkaline Phosphatase: 82 U/L (ref 39–117)
BUN: 16 mg/dL (ref 6–23)
CO2: 32 meq/L (ref 19–32)
Calcium: 8.9 mg/dL (ref 8.4–10.5)
Chloride: 101 meq/L (ref 96–112)
Creatinine, Ser: 1.07 mg/dL (ref 0.40–1.50)
GFR: 67.94 mL/min (ref >60.00)
Glucose, Bld: 88 mg/dL (ref 70–99)
Potassium: 4.6 meq/L (ref 3.5–5.1)
Sodium: 140 meq/L (ref 135–145)
Total Bilirubin: 0.7 mg/dL (ref 0.2–1.2)
Total Protein: 6.6 g/dL (ref 6.0–8.3)

## 2024-03-06 LAB — CBC
HCT: 51.9 % (ref 39.0–52.0)
Hemoglobin: 17.1 g/dL — ABNORMAL HIGH (ref 13.0–17.0)
MCHC: 33 g/dL (ref 30.0–36.0)
MCV: 88.8 fl (ref 78.0–100.0)
Platelets: 189 K/uL (ref 150.0–400.0)
RBC: 5.84 Mil/uL — ABNORMAL HIGH (ref 4.22–5.81)
RDW: 15.7 % — ABNORMAL HIGH (ref 11.5–15.5)
WBC: 7.9 K/uL (ref 4.0–10.5)

## 2024-03-06 LAB — LIPID PANEL
Cholesterol: 109 mg/dL (ref 0–200)
HDL: 42.7 mg/dL (ref >39.00)
LDL Cholesterol: 45 mg/dL (ref 0–99)
NonHDL: 65.9
Total CHOL/HDL Ratio: 3
Triglycerides: 104 mg/dL (ref 0.0–149.0)
VLDL: 20.8 mg/dL (ref 0.0–40.0)

## 2024-03-06 LAB — CUP PACEART REMOTE DEVICE CHECK
Date Time Interrogation Session: 20251014232345
Implantable Pulse Generator Implant Date: 20240423

## 2024-03-06 MED ORDER — ATORVASTATIN CALCIUM 80 MG PO TABS: 80.0000 mg | ORAL_TABLET | Freq: Every day | ORAL | 3 refills | Status: AC

## 2024-03-06 NOTE — Progress Notes (Signed)
 OFFICE VISIT  03/06/2024  CC:  Chief Complaint  Patient presents with   Medical Management of Chronic Issues    Pt is not fasting    Patient is a 73 y.o. male who presents for 46-month follow-up hypertension, hyperlipidemia, and chronic erythrocytosis. A/P as of last visit: #1 hypertension, well-controlled on losartan -HCTZ 100-12.5, 1 tab daily.  Also Toprol -XL 100 mg a day.  He has clonidine  0.1 mg to use as needed systolic blood pressure over 170.   #2 hypercholesterolemia, doing well on a atorvastatin  80 mg a day long-term. Lipid panel and hepatic panel today.   3.  Colon cancer screening. History of adenomatous colon polyps. 2017 was last colonoscopy.  5-year repeat was recommended by GI --> Dr. Celestia has retired so I referred him to Dr. Elsie Cree today.   4. chronic erythrocytosis. Suspect due to severe obstructive sleep apnea (sleep study confirmed OSA but he declines CPAP). Monitor CBC today.  INTERIM HX: Derrick [name] feels well. His left ankle swells on and off but it does not hurt him.  He remains very active and eats a healthy diet for the most part.  ROS as above, plus--> no fevers, no CP, no SOB, no wheezing, no cough, no dizziness, no HAs, no rashes, no melena/hematochezia.  No polyuria or polydipsia.  No myalgias or arthralgias.  No focal weakness, paresthesias, or tremors.  No acute vision or hearing abnormalities.  No dysuria or unusual/new urinary urgency or frequency.  No recent changes in lower legs. No n/v/d or abd pain.  No palpitations.     Past Medical History:  Diagnosis Date   BPH with obstruction/lower urinary tract symptoms    +Hx of acute urinary retention/acute cystitis: responding fairly well to tamsulosin  and finasteride  as of 09/2016 urol f/u.  Urolift and bladd bx 02/2018-->doing great as of 07/2019 urol f/u.   Deafness in right ear    Diastolic dysfunction    Erythrocytosis    stable dating back to 2009 (17-18 range). Iron normal. Epo  normal.  Just following as long as stable as of 10/2020 (no hem/onc ref).  Dx'd severe OSA 09/2022   Fibula fracture    LEFT 09/2023   Gross hematuria    hematuria protocol w/u being done by urol as of their 01/23/18 note.   History of back injury    Back fracture fell from tree   History of erectile dysfunction    History of fracture of right ankle 05/2015   History of kidney stones    History of staph infection 10 yrs ago   after back surgery   HTN (hypertension)    Hyperlipidemia    Low back pain    Microhematuria    chronic; pt gets periodic cystoscopy for monitoring, most recent 07/30/20-->bx showed benign urothelium and submucosa, with eosinophilic cystitis.   Nephrolithiasis 2015   CT showed 2cm left sided stone which was stable since 2006 imaging and likely in renal parenchyma.  2.5 cm as of 08/2017 neph f/u: renal u/s 12/2017-->no hydro. KUB showed stable L sided 18 mm stone. Cystoscopy 12/2017 erythematous mucosa at dome, no mass.    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  Annotation: previous methicillin sensitive  staphylococcus aureus 2006 Qualifier: Diagnosis of  By: Eben MD, Reyes  (  ID Specialist)     Prostate cancer screening    via Urol--> PSA 1.39 April 2019. 1.36 Feb 2020.   TIA (transient ischemic attack)    03/2022.   Tinnitus, left ear     Past Surgical History:  Procedure Laterality Date   COLONOSCOPY  10/12/2015   repeat 5 years   COLONOSCOPY W/ POLYPECTOMY  2007   Negative 2012 and 2017; Dr Madelyne 2022.   CYSTOSCOPY     with bladder biopsy:  dome of bladder with an area of chronic inflammation.  No malignancy.   CYSTOSCOPY WITH BIOPSY N/A 02/19/2018   Procedure: CYSTOSCOPY WITH BIOPSY/ FULGURATION / UROLIFT;  Surgeon: Nieves Cough, MD;  Location: [facility];  Service: Urology;  Laterality: N/A;   CYSTOSCOPY WITH FULGERATION N/A 07/30/2020   Bx benign. Procedure: CYSTOSCOPY WITH BLADDER BIOPSY/FULGERATION;  Surgeon: Nieves Cough, MD;  Location: [facility];  Service: Urology;  Laterality: N/A;   CYSTOSCOPY WITH INSERTION OF UROLIFT  02/19/2018   LOOP RECORDER IMPLANTATION     08/2022   LUMBAR LAMINECTOMY     Dr Unice performed 2nd & 3rd procedures (pt also has hx of fall and vertebral fracture years prior to his surgeries.   TONSILLECTOMY  as child   TRANSTHORACIC ECHOCARDIOGRAM     03/2022 EF normal, grd I DD, mild AI, mod Pulm insuff    Outpatient Medications Prior to Visit  Medication Sig Dispense Refill   Ascorbic Acid (VITAMIN C) 1000 MG tablet Take 1,000 mg by mouth at bedtime.     aspirin  EC 81 MG tablet Take 1 tablet (81 mg total) by mouth daily. Swallow whole. 30 tablet 11   cloNIDine  (CATAPRES ) 0.1 MG tablet Take 1 tablet (0.1 mg total) by mouth daily as needed (Take once if systolic blood pressure over 170). 30 tablet 0   finasteride  (PROSCAR ) 5 MG tablet TAKE 1 TABLET BY MOUTH EVERYDAY AT BEDTIME 30 tablet 0   losartan -hydrochlorothiazide (HYZAAR) 100-12.5 MG tablet TAKE 1 TABLET BY MOUTH EVERY DAY 90 tablet 1   metoprolol  succinate (TOPROL -XL) 100 MG 24 hr tablet TAKE 1 TABLET BY MOUTH EVERY DAY WITH OR IMMEDIATELY FOLLOWING A MEAL 90 tablet 2   pantoprazole  (PROTONIX ) 40 MG tablet Take 1 tablet (40 mg total) by mouth daily. 90 tablet 3   tadalafil  (CIALIS ) 10 MG tablet 1-2 tabs po qd prn.  Take approx 1 hour prior to intercourse 10 tablet 1   tamsulosin  (FLOMAX ) 0.4 MG CAPS capsule Take 1 capsule (0.4 mg total) by mouth daily. 90 capsule 3   [employer] GENTLE LAXATIVE 5 MG EC tablet Take by mouth as directed. (Patient not taking: Reported on 03/06/2024)     polyethylene glycol-electrolytes (NULYTELY) 420 g solution as directed. (Patient not taking: Reported on 03/06/2024)     atorvastatin   (LIPITOR) 80 MG tablet TAKE 1 TABLET BY MOUTH EVERY DAY 90 tablet 1   No facility-administered medications prior to visit.    No Known Allergies  Review of Systems As per HPI  PE:    03/06/2024    8:08 AM 11/27/2023    9:00 AM 11/08/2023    8:37 AM  Vitals with BMI  Height 5' 8 5' 8 5' 8  Weight 258 lbs 10 oz 256 lbs 253 lbs 3 oz  BMI 39.33 38.93 38.51  Systolic 101 113 888  Diastolic 65 65 73  Pulse 72 72 74     Physical Exam  Gen: Alert, well appearing.  Patient is oriented to  person, place, time, and situation. AFFECT: pleasant, lucid thought and speech. CV: RRR, no m/r/g.   LUNGS: CTA bilat, nonlabored resps, good aeration in all lung fields. Extremities: No pitting edema.  He has a mild amount of periarticular swelling of the left ankle.  No warmth or erythema or tenderness to palpation.  Ankle range of motion fully intact.  LABS:  Last CBC Lab Results  Component Value Date   WBC 8.2 09/05/2023   HGB 17.6 (H) 09/05/2023   HCT 52.8 (H) 09/05/2023   MCV 91.0 09/05/2023   MCH 29.8 08/18/2022   RDW 15.5 09/05/2023   PLT 189.0 09/05/2023   Lab Results  Component Value Date   IRON 111 11/10/2020   TIBC 280 11/10/2020   FERRITIN 180 11/10/2020   Last metabolic panel Lab Results  Component Value Date   GLUCOSE 98 09/05/2023   NA 138 09/05/2023   K 4.4 09/05/2023   CL 100 09/05/2023   CO2 30 09/05/2023   BUN 16 09/05/2023   CREATININE 1.05 09/05/2023   GFR 69.74 09/05/2023   CALCIUM  9.1 09/05/2023   PHOS 2.6 06/19/2012   PROT 6.7 09/05/2023   ALBUMIN 4.2 09/05/2023   BILITOT 0.8 09/05/2023   ALKPHOS 71 09/05/2023   AST 18 09/05/2023   ALT 22 09/05/2023   ANIONGAP 10 04/30/2022   Last lipids Lab Results  Component Value Date   CHOL 116 09/05/2023   HDL 43.80 09/05/2023   LDLCALC 54 09/05/2023   LDLDIRECT 133.0 06/26/2016   TRIG 87.0 09/05/2023   CHOLHDL 3 09/05/2023   Last hemoglobin A1c Lab Results  Component Value Date   HGBA1C 5.6  04/22/2022   Last thyroid  functions Lab Results  Component Value Date   TSH 1.33 05/23/2022   Last vitamin D Lab Results  Component Value Date   VD25OH 40 06/21/2010   Lab Results  Component Value Date   PSA 1.36 07/04/2018   PSA 1.36 07/04/2018   PSA 1.95 06/21/2011   IMPRESSION AND PLAN:  1 hypertension, well-controlled on losartan -HCTZ 100-12.5, 1 tab daily.  Also Toprol -XL 100 mg a day.  He has clonidine  0.1 mg to use as needed systolic blood pressure over 170. Check electrolytes and creatinine today.  #2 hypercholesterolemia, doing well on a atorvastatin  80 mg a day long-term. Lipid panel and hepatic panel today.   3.  Colon cancer screening. History of adenomatous colon polyps. 2017 was last colonoscopy.  5-year repeat was recommended by GI --> Dr. Celestia has retired so I referred him to Dr. Elsie Cree at last visit 6 mo ago  4. chronic erythrocytosis. Suspect due to severe obstructive sleep apnea (sleep study confirmed OSA but he declines CPAP). Monitor CBC today.  An After Visit Summary was printed and given to the patient.  FOLLOW UP: Return in about 6 months (around 09/04/2024) for routine chronic illness f/u.  Signed:  Gerlene Hockey, MD           03/06/2024

## 2024-03-07 ENCOUNTER — Ambulatory Visit: Payer: Self-pay | Admitting: Family Medicine

## 2024-03-10 NOTE — Progress Notes (Signed)
 Remote Loop Recorder Transmission

## 2024-04-05 ENCOUNTER — Encounter

## 2024-04-06 ENCOUNTER — Ambulatory Visit

## 2024-04-07 ENCOUNTER — Encounter

## 2024-04-09 ENCOUNTER — Ambulatory Visit: Attending: Cardiology

## 2024-04-09 DIAGNOSIS — G459 Transient cerebral ischemic attack, unspecified: Secondary | ICD-10-CM

## 2024-04-09 LAB — CUP PACEART REMOTE DEVICE CHECK
Date Time Interrogation Session: 20251118235126
Implantable Pulse Generator Implant Date: 20240423

## 2024-04-10 ENCOUNTER — Ambulatory Visit: Payer: Self-pay | Admitting: Cardiology

## 2024-04-11 NOTE — Progress Notes (Signed)
 Remote Loop Recorder Transmission

## 2024-05-06 ENCOUNTER — Encounter

## 2024-05-07 ENCOUNTER — Ambulatory Visit

## 2024-05-08 ENCOUNTER — Encounter

## 2024-05-10 ENCOUNTER — Ambulatory Visit

## 2024-05-10 DIAGNOSIS — G459 Transient cerebral ischemic attack, unspecified: Secondary | ICD-10-CM | POA: Diagnosis not present

## 2024-05-11 LAB — CUP PACEART REMOTE DEVICE CHECK
Date Time Interrogation Session: 20251219233608
Implantable Pulse Generator Implant Date: 20240423

## 2024-05-12 NOTE — Progress Notes (Signed)
 Remote Loop Recorder Transmission

## 2024-05-13 ENCOUNTER — Telehealth: Payer: Self-pay

## 2024-05-13 NOTE — Telephone Encounter (Signed)
 Outreach made to Pt.  Advised of new afib seen on loop recorder.  Pt scheduled to see Afib clinic 05/19/2024 at 8:30 am.    All questions answered.

## 2024-05-13 NOTE — Telephone Encounter (Signed)
 Alert received:  Alert remote transmission: AF 1 AF event event in progress from 12/23 @ 08:04, no hx of PAF, ASA only - route to triage

## 2024-05-19 ENCOUNTER — Ambulatory Visit (HOSPITAL_COMMUNITY)
Admission: RE | Admit: 2024-05-19 | Discharge: 2024-05-19 | Disposition: A | Discharge status: Home / Self Care | Source: Ambulatory Visit | Attending: Internal Medicine | Admitting: Internal Medicine

## 2024-05-19 ENCOUNTER — Telehealth: Payer: Self-pay

## 2024-05-19 ENCOUNTER — Encounter (HOSPITAL_COMMUNITY): Payer: Self-pay | Admitting: Internal Medicine

## 2024-05-19 VITALS — BP 102/72 | HR 84 | Ht 68.0 in | Wt 260.8 lb

## 2024-05-19 DIAGNOSIS — I2510 Atherosclerotic heart disease of native coronary artery without angina pectoris: Secondary | ICD-10-CM | POA: Diagnosis not present

## 2024-05-19 DIAGNOSIS — I4891 Unspecified atrial fibrillation: Secondary | ICD-10-CM | POA: Diagnosis not present

## 2024-05-19 DIAGNOSIS — I480 Paroxysmal atrial fibrillation: Secondary | ICD-10-CM | POA: Diagnosis not present

## 2024-05-19 DIAGNOSIS — I10 Essential (primary) hypertension: Secondary | ICD-10-CM | POA: Diagnosis not present

## 2024-05-19 DIAGNOSIS — I5032 Chronic diastolic (congestive) heart failure: Secondary | ICD-10-CM | POA: Insufficient documentation

## 2024-05-19 MED ORDER — APIXABAN 5 MG PO TABS: 5.0000 mg | ORAL_TABLET | Freq: Two times a day (BID) | ORAL | 3 refills | Status: AC

## 2024-05-19 NOTE — Telephone Encounter (Signed)
 Alert remote transmission: AF 4 AF events up to ~9 hrs since 12/23.   Patient is being seen this morning in AF clinic, forwarding to Fairy Heinrich PA for update.

## 2024-05-19 NOTE — Progress Notes (Signed)
 "  Primary Care Physician: Candise Aleene DEL, MD Primary Cardiologist: None Electrophysiologist: Elspeth Sage, MD (Inactive)  Referring Physician: EP   Derrick [name] is a 73 y.o. male with a history of TIA, HTN HLD, HFpEF, nonsustained VT, OSA who presents for follow up in the [facility].  The patient was initially diagnosed with nonsustained VT and was evaluated by Dr. Sage in 07/2022.  He has a history of TIA suffered in 2023 and recommendation was made to undergo loop implant that was completed on 09/12/2022.  He was last seen by Jodie Passey, PA on 10/02/2023 for cardiac clearance and was doing well with no evidence of sustained arrhythmia on loop recorder.  The device clinic received a remote transmission alert indicating A-fib event in progress on 12/23 and patient was contacted and triage for appointment in A-fib clinic.  [name] presents today to establish care in the atrial fibrillation clinic.  During today's visit he is maintaining sinus rhythm with controlled rate of 84 bpm.  We discussed the mechanisms of atrial fibrillation and triggers as well as potential treatment consisting of ablation, and antiarrhythmic medication.  He reports being asymptomatic with the exception of fatigue.  We discussed his stroke risk and importance of initiating anticoagulation.  He was previously diagnosed with OSA but did not initiate CPAP therapy due to cost.  He is aware and in agreement to begin Eliquis  following today's visit.   Today, he denies symptoms of palpitations, chest pain, shortness of breath, orthopnea, PND, lower extremity edema, dizziness, presyncope, syncope, snoring, daytime somnolence, bleeding, or neurologic sequela. The patient is tolerating medications without difficulties and is otherwise without complaint today.     Atrial Fibrillation Risk Factors: History of Sleep Apnea and will require new sleep study Occasional beer with friends and not a daily  alcohol user  Atrial Fibrillation Management history: Previous cardioversions: None  ROS- All systems are reviewed and negative except as per the HPI above.  Past Medical History:  Diagnosis Date   BPH with obstruction/lower urinary tract symptoms    +Hx of acute urinary retention/acute cystitis: responding fairly well to tamsulosin  and finasteride  as of 09/2016 urol f/u.  Urolift and bladd bx 02/2018-->doing great as of 07/2019 urol f/u.   Deafness in right ear    Diastolic dysfunction    Erythrocytosis    stable dating back to 2009 (17-18 range). Iron normal. Epo normal.  Just following as long as stable as of 10/2020 (no hem/onc ref).  Dx'd severe OSA 09/2022   Fibula fracture    LEFT transsyndesmotic fibular fracture 09/2023   Gross hematuria    hematuria protocol w/u being done by urol as of their 01/23/18 note.   History of back injury    Back fracture fell from tree   History of erectile dysfunction    History of fracture of right ankle 05/2015   History of kidney stones    History of staph infection 10 yrs ago   after back surgery   HTN (hypertension)    Hyperlipidemia    Low back pain    Microhematuria    chronic; pt gets periodic cystoscopy for monitoring, most recent 07/30/20-->bx showed benign urothelium and submucosa, with eosinophilic cystitis.   Nephrolithiasis 2015   CT showed 2cm left sided stone which was stable since 2006 imaging and likely in renal parenchyma.  2.5 cm as of 08/2017 neph f/u: renal u/s 12/2017-->no hydro. KUB showed stable L sided 18 mm stone. Cystoscopy 12/2017  erythematous mucosa at dome, no mass.    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  Annotation: previous methicillin sensitive  staphylococcus aureus 2006 Qualifier: Diagnosis of  By:  Eben MD, Reyes  (ID Specialist)     Prostate cancer screening    via Urol--> PSA 1.39 April 2019. 1.36 Feb 2020.   TIA (transient ischemic attack)    03/2022.   Tinnitus, left ear     Current Outpatient Medications  Medication Sig Dispense Refill   apixaban  (ELIQUIS ) 5 MG TABS tablet Take 1 tablet (5 mg total) by mouth 2 (two) times daily. 60 tablet 3   Ascorbic Acid (VITAMIN C) 1000 MG tablet Take 1,000 mg by mouth at bedtime.     atorvastatin  (LIPITOR) 80 MG tablet Take 1 tablet (80 mg total) by mouth daily. 90 tablet 3   cloNIDine  (CATAPRES ) 0.1 MG tablet Take 1 tablet (0.1 mg total) by mouth daily as needed (Take once if systolic blood pressure over 170). 30 tablet 0   finasteride  (PROSCAR ) 5 MG tablet TAKE 1 TABLET BY MOUTH EVERYDAY AT BEDTIME 30 tablet 0   losartan -hydrochlorothiazide (HYZAAR) 100-12.5 MG tablet TAKE 1 TABLET BY MOUTH EVERY DAY 90 tablet 1   metoprolol  succinate (TOPROL -XL) 100 MG 24 hr tablet TAKE 1 TABLET BY MOUTH EVERY DAY WITH OR IMMEDIATELY FOLLOWING A MEAL 90 tablet 2   pantoprazole  (PROTONIX ) 40 MG tablet Take 1 tablet (40 mg total) by mouth daily. 90 tablet 3   tadalafil  (CIALIS ) 10 MG tablet 1-2 tabs po qd prn.  Take approx 1 hour prior to intercourse 10 tablet 1   tamsulosin  (FLOMAX ) 0.4 MG CAPS capsule Take 1 capsule (0.4 mg total) by mouth daily. (Patient taking differently: Take 0.4 mg by mouth as needed.) 90 capsule 3   [employer] GENTLE LAXATIVE 5 MG EC tablet Take by mouth as directed. (Patient not taking: Reported on 03/06/2024)     polyethylene glycol-electrolytes (NULYTELY) 420 g solution as directed. (Patient not taking: Reported on 03/06/2024)     No current facility-administered medications for this encounter.    Physical Exam: BP 102/72   Pulse 84   Ht 5' 8 (1.727 m)   Wt 118.3 kg   BMI 39.65 kg/m   GEN: Well nourished, well developed in no acute distress NECK: No JVD; No carotid bruits CARDIAC: Regular rate and rhythm, no murmurs,  rubs, gallops RESPIRATORY:  Clear to auscultation without rales, wheezing or rhonchi  ABDOMEN: Soft, non-tender, non-distended EXTREMITIES:  No edema; No deformity   Wt Readings from Last 3 Encounters:  05/19/24 118.3 kg  03/06/24 117.3 kg  11/27/23 116.1 kg     EKG today demonstrates:  EKG Interpretation Date/Time:  Monday May 19 2024 08:23:08 EST Ventricular Rate:  84 PR Interval:  166 QRS Duration:  90 QT Interval:  392 QTC Calculation: 463 R Axis:   112  Text Interpretation: Normal sinus rhythm with sinus arrhythmia Low voltage QRS 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 Left ventricular ejection fraction, by estimation, is 65 to 70%. The  left ventricle has normal function. The left ventricle has no regional  wall motion abnormalities. Left ventricular  diastolic parameters are  consistent with Grade I diastolic  dysfunction (impaired relaxation).   2. Right ventricular systolic function is normal. The right ventricular  size is normal.   3. The mitral valve is normal in structure. No evidence of mitral valve  regurgitation. No evidence of mitral stenosis.   4. The aortic valve is normal in structure. Aortic valve regurgitation is  mild. No aortic stenosis is present.   5. Pulmonic valve regurgitation is moderate.   6. The inferior vena cava is normal in size with greater than 50%  respiratory variability, suggesting right atrial pressure of 3 mmHg.   CHA2DS2-VASc Score = 7  The patient's score is based upon: CHF History: 1 HTN History: 1 Diabetes History: 0 Stroke History: 2 Vascular Disease History: 1 Age Score: 2 Gender Score: 0      ASSESSMENT AND PLAN: Paroxysmal Atrial Fibrillation (ICD10:  I48.0) The patient's CHA2DS2-VASc score is 7, indicating a 11.2% annual risk of stroke.   -History of TIA with implantable loop  recorder in 2024 with AF alert on 05/13/2024 - Patient currently in sinus rhythm today and with elevated CHA2DS2-VASc score of 7 will require initiation of anticoagulant - Patient will start Eliquis  5 mg twice daily - Discontinue ASA 81 mg - Patient will follow-up in 1 month and complete CBC at that time - We will discuss repeat sleep study at follow-up in 1 month - Patient advised to contact office if he has any further questions and ED precautions discussed if he becomes symptomatic.  HTN: BP well controlled. Continue current antihypertensive regimen.   HFpEF: - 2D echo completed 02/2022 showing normal EF valve function with no evidence of structural heart disease  Coronary calcifications: -Mild coronary calcification seen on-cardiac MRI -Continue atorvastatin  as prescribed  -Hx OSA: - Previously diagnosed with OSA but refused CPAP due to cost and is currently interested in pursuing CPAP therapy -We will schedule follow-up with Dr. Shlomo to discuss initiating therapy.   Follow up with the AF Clinic in 1 month  Jackee Alberts, NP-C Af[facility] Clinic [address] [place], [place] [address] [phone] "

## 2024-05-19 NOTE — Patient Instructions (Addendum)
 Stop aspirin  Start Eliquis 5mg  twice a day

## 2024-05-21 ENCOUNTER — Ambulatory Visit: Payer: Self-pay | Admitting: Cardiovascular Disease

## 2024-05-23 ENCOUNTER — Telehealth: Payer: Self-pay | Admitting: *Deleted

## 2024-05-23 NOTE — Telephone Encounter (Addendum)
 Reached out to patient and reminded him  that Dr Shlomo had recommended him to have a cpap titration but he declined having the titration and wanted to explore other options to getting a cpap machine. At that time patient put getting his cpap on hold until he is ready to move forward. Patient is now asking if he can rent a cpap machine to try out to see if he can tolerate it. Patient was encouraged to call [facility] back who would be ale to better assist him because they are a dme.

## 2024-05-23 NOTE — Telephone Encounter (Addendum)
-----   Message from Nurse Glade BROCKS, RN sent at 05/19/2024 10:34 AM EST ----- Regarding: cpap Pt would like to speak to someone regarding possibly starting cpap can someone call to discuss with patient?  Thanks [facility] Pro[facility]

## 2024-06-06 ENCOUNTER — Encounter

## 2024-06-07 ENCOUNTER — Ambulatory Visit

## 2024-06-09 ENCOUNTER — Encounter

## 2024-06-10 ENCOUNTER — Ambulatory Visit

## 2024-06-10 DIAGNOSIS — G459 Transient cerebral ischemic attack, unspecified: Secondary | ICD-10-CM | POA: Diagnosis not present

## 2024-06-11 LAB — CUP PACEART REMOTE DEVICE CHECK
Date Time Interrogation Session: 20260119233144
Implantable Pulse Generator Implant Date: 20240423

## 2024-06-12 NOTE — Progress Notes (Incomplete)
 "  Primary Care Physician: Candise Aleene DEL, MD Primary Cardiologist: None Electrophysiologist: Elspeth Sage, MD (Inactive)  Referring Physician: EP    Derrick [name] is a 74 y.o. male with a history of paroxysmal AF, TIA s/p loop recorder 2024, HTN HLD, HFpEF, nonsustained VT, OSA who presents today for 1 month follow-up.  Mr. Petite was seen in the AF clinic to establish care after loop recorder indicated atrial fibrillation in progress on 05/13/2024.  He has a chads Vascor 7 and was started on Eliquis  5 mg twice daily.  He was previously diagnosed with OSA with CPAP titration recommended but declined at that time.  He was advised to reach out to the sleep coordinator for Dr. Shlomo to follow-up on initiating CPAP therapy.    Today, he denies symptoms of ***palpitations, chest pain, shortness of breath, orthopnea, PND, lower extremity edema, dizziness, presyncope, syncope, snoring, daytime somnolence, bleeding, or neurologic sequela. The patient is tolerating medications without difficulties and is otherwise without complaint today.   ***Discussed the use of AI scribe software for clinical note transcription with the patient, who gave verbal consent to proceed.   Notes: ***  Atrial Fibrillation Management history: History of Sleep Apnea*** History of alcohol use*** History of early familial atrial fibrillation or other arrhythmias *** Previous antiarrhythmic drugs: *** Previous cardioversions: *** Previous ablations: *** Anticoagulation history: ***  ROS- All systems are reviewed and negative except as per the HPI above.  Past Medical History:  Diagnosis Date   BPH with obstruction/lower urinary tract symptoms    +Hx of acute urinary retention/acute cystitis: responding fairly well to tamsulosin  and finasteride  as of 09/2016 urol f/u.  Urolift and bladd bx 02/2018-->doing great as of 07/2019 urol f/u.   Deafness in right ear    Diastolic dysfunction    Erythrocytosis    stable  dating back to 2009 (17-18 range). Iron normal. Epo normal.  Just following as long as stable as of 10/2020 (no hem/onc ref).  Dx'd severe OSA 09/2022   Fibula fracture    LEFT transsyndesmotic fibular fracture 09/2023   Gross hematuria    hematuria protocol w/u being done by urol as of their 01/23/18 note.   History of back injury    Back fracture fell from tree   History of erectile dysfunction    History of fracture of right ankle 05/2015   History of kidney stones    History of staph infection 10 yrs ago   after back surgery   HTN (hypertension)    Hyperlipidemia    Low back pain    Microhematuria    chronic; pt gets periodic cystoscopy for monitoring, most recent 07/30/20-->bx showed benign urothelium and submucosa, with eosinophilic cystitis.   Nephrolithiasis 2015   CT showed 2cm left sided stone which was stable since 2006 imaging and likely in renal parenchyma.  2.5 cm as of 08/2017 neph f/u: renal u/s 12/2017-->no hydro. KUB showed stable L sided 18 mm stone. Cystoscopy 12/2017 erythematous mucosa at dome, no mass.    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  Annotation: previous methicillin sensitive  staphylococcus aureus 2006 Qualifier: Diagnosis of  By: Eben MD, Reyes  (ID Specialist)     Prostate cancer screening    via Urol--> PSA 1.39 April  2019. 1.36 Feb 2020.   TIA (transient ischemic attack)    03/2022.   Tinnitus, left ear    Past Surgical History:  Procedure Laterality Date   COLONOSCOPY  10/12/2015   repeat 5 years   COLONOSCOPY W/ POLYPECTOMY  2007   Negative 2012 and 2017; Dr Madelyne 2022.   CYSTOSCOPY     with bladder biopsy:  dome of bladder with an area of chronic inflammation.  No malignancy.   CYSTOSCOPY WITH BIOPSY N/A 02/19/2018    Procedure: CYSTOSCOPY WITH BIOPSY/ FULGURATION / UROLIFT;  Surgeon: Nieves Cough, MD;  Location: [facility];  Service: Urology;  Laterality: N/A;   CYSTOSCOPY WITH FULGERATION N/A 07/30/2020   Bx benign. Procedure: CYSTOSCOPY WITH BLADDER BIOPSY/FULGERATION;  Surgeon: Nieves Cough, MD;  Location: [facility];  Service: Urology;  Laterality: N/A;   CYSTOSCOPY WITH INSERTION OF UROLIFT  02/19/2018   LOOP RECORDER IMPLANTATION     08/2022   LUMBAR LAMINECTOMY     Dr Unice performed 2nd & 3rd procedures (pt also has hx of fall and vertebral fracture years prior to his surgeries.   TONSILLECTOMY  as child   TRANSTHORACIC ECHOCARDIOGRAM     03/2022 EF normal, grd I DD, mild AI, mod Pulm insuff   Patient has no known allergies. Current Outpatient Medications  Medication Sig Dispense Refill   apixaban  (ELIQUIS ) 5 MG TABS tablet Take 1 tablet (5 mg total) by mouth 2 (two) times daily. 60 tablet 3   Ascorbic Acid (VITAMIN C) 1000 MG tablet Take 1,000 mg by mouth at bedtime.     atorvastatin  (LIPITOR) 80 MG tablet Take 1 tablet (80 mg total) by mouth daily. 90 tablet 3   cloNIDine  (CATAPRES ) 0.1 MG tablet Take 1 tablet (0.1 mg total) by mouth daily as needed (Take once if systolic blood pressure over 170). 30 tablet 0   finasteride  (PROSCAR ) 5 MG tablet TAKE 1 TABLET BY MOUTH EVERYDAY AT BEDTIME 30 tablet 0   losartan -hydrochlorothiazide (HYZAAR) 100-12.5 MG tablet TAKE 1 TABLET BY MOUTH EVERY DAY 90 tablet 1   metoprolol  succinate (TOPROL -XL) 100 MG 24 hr tablet TAKE 1 TABLET BY MOUTH EVERY DAY WITH OR IMMEDIATELY FOLLOWING A MEAL 90 tablet 2   pantoprazole  (PROTONIX ) 40 MG tablet Take 1 tablet (40 mg total) by mouth daily. 90 tablet 3   tadalafil  (CIALIS ) 10 MG tablet 1-2 tabs po qd prn.  Take approx 1 hour prior to intercourse 10 tablet 1   tamsulosin  (FLOMAX ) 0.4 MG CAPS capsule Take 1 capsule (0.4 mg total) by mouth daily. (Patient taking differently: Take 0.4 mg by mouth as  needed.) 90 capsule 3   No current facility-administered medications for this visit.    Physical Exam: There were no vitals taken for this visit.  GEN: Well nourished, well developed in no acute distress NECK: No JVD; No carotid bruits CARDIAC: {EPRHYTHM:[id number]}, no murmurs, rubs, gallops RESPIRATORY:  Clear to auscultation without rales, wheezing or rhonchi  ABDOMEN: Soft, non-tender, non-distended EXTREMITIES:  No edema; No deformity   Wt Readings from Last 3 Encounters:  05/19/24 118.3 kg  03/06/24 117.3 kg  11/27/23 116.1 kg    Lab Results  Component Value Date   TSH 1.33 05/23/2022   EKG today demonstrates:   EKG Interpretation Date/Time:    Ventricular Rate:    PR Interval:    QRS Duration:    QT Interval:    QTC Calculation:   R Axis:      Text Interpretation:  Echo Completed ***  CHA2DS2-VASc Score = 7  The patient's score is based upon: CHF History: 1 HTN History: 1 Diabetes History: 0 Stroke History: 2 Vascular Disease History: 1 Age Score: 2 Gender Score: 0   {Confirm score is correct.  If not, click here to update score.  REFRESH note.  :1}    ASSESSMENT AND PLAN: {Select the correct AFib Diagnosis                 :[id number]}   Signed,  Wyn Raddle, Jackee Shove, NP    06/12/2024 9:50 AM     {Are you ordering a CV Procedure (e.g. stress test, cath, DCCV, TEE, etc)?   Press F2        :[id number]}  Do not forget to refresh clinic note for EKG  Follow up with the AF Clinic in ***  {Click to Open Review  :1}  "

## 2024-06-13 NOTE — Progress Notes (Signed)
 Remote Loop Recorder Transmission

## 2024-06-17 ENCOUNTER — Ambulatory Visit: Payer: Self-pay | Admitting: Cardiovascular Disease

## 2024-06-17 ENCOUNTER — Ambulatory Visit (HOSPITAL_COMMUNITY): Admitting: Nurse Practitioner

## 2024-06-18 NOTE — Progress Notes (Signed)
 "  Primary Care Physician: Candise Aleene DEL, MD Primary Cardiologist: None Electrophysiologist: Elspeth Sage, MD (Inactive)  Referring Physician: EP  Derrick [name] is a 74 y.o. male with a history of paroxysmal AF, TIA s/p loop recorder 2024, HTN HLD, HFpEF, nonsustained VT, OSA who presents today for 1 month follow-up.  Derrick [name] was seen in the AF clinic to establish care after loop recorder indicated atrial fibrillation in progress on 05/13/2024.  He has a chads Vascor 7 and was started on Eliquis  5 mg twice daily.  He was previously diagnosed with OSA with CPAP titration recommended but declined at that time. He was advised to reach out to the sleep coordinator for Dr. Shlomo to follow-up on initiating CPAP therapy.   Derrick [name] presents today for 1 month follow-up.  On examination today he is in sinus rhythm and continues to be asymptomatic when in A-fib.He had a recent ILR interrogation that showed 9.2% AF burden completed on 06/17/2024. He reports poor sleep quality and previously completed a sleep study with recommended CPAP but is still is still hesitant to begin therapy.  We discussed lifestyle modifications such as weight loss and poor sleep quality as triggers for A-fib. He is actively trying to lose weight by cutting out carbohydrates and sweets, and he exercises regularly by swimming three times a week. Despite these efforts, he has not noticed significant weight loss. He is interested in weight loss medications like Wegovy but has not yet pursued this with his primary doctor. No significant stress or anxiety, as he is retired and leads a relatively stress-free life. He enjoys activities such as duck hunting and recently returned from a trip to [place], where he engaged in physical activities like horseback riding.  We discussed treatment options such as rate and rhythm control and he is interested in discussing ablation and would like to avoid medications at this time.  Discussed  the use of AI scribe software for clinical note transcription with the patient, who gave verbal consent to proceed.   Atrial Fibrillation Management history: History of Sleep Apnea diagnosed and working on obtaining CPAP History of alcohol use occasional Previous antiarrhythmic drugs: None Previous cardioversions: None Previous ablations: None Anticoagulation history: Eliquis   ROS- All systems are reviewed and negative except as per the HPI above.  Past Medical History:  Diagnosis Date   BPH with obstruction/lower urinary tract symptoms    +Hx of acute urinary retention/acute cystitis: responding fairly well to tamsulosin  and finasteride  as of 09/2016 urol f/u.  Urolift and bladd bx 02/2018-->doing great as of 07/2019 urol f/u.   Deafness in right ear    Diastolic dysfunction    Erythrocytosis    stable dating back to 2009 (17-18 range). Iron normal. Epo normal.  Just following as long as stable as of 10/2020 (no hem/onc ref).  Dx'd severe OSA 09/2022   Fibula fracture    LEFT transsyndesmotic fibular fracture 09/2023   Gross hematuria    hematuria protocol w/u being done by urol as of their 01/23/18 note.   History of back injury    Back fracture fell from tree   History of erectile dysfunction    History of fracture of right ankle 05/2015   History of kidney stones    History of staph infection 10 yrs ago   after back surgery   HTN (hypertension)    Hyperlipidemia    Low back pain    Microhematuria    chronic; pt gets periodic cystoscopy for monitoring,  most recent 07/30/20-->bx showed benign urothelium and submucosa, with eosinophilic cystitis.   Nephrolithiasis 2015   CT showed 2cm left sided stone which was stable since 2006 imaging and likely in renal parenchyma.  2.5 cm as of 08/2017 neph f/u: renal u/s 12/2017-->no hydro. KUB showed stable L sided 18 mm stone. Cystoscopy 12/2017 erythematous mucosa at dome, no mass.    NSVT (nonsustained ventricular tachycardia) (HCC)    LOOP  implanted 08/2022   Obesity, Class II, BMI 35-39.9    OSA (obstructive sleep apnea)    Patient declines CPAP   OSTEOMYELITIS, CHRONIC 02/21/2006   Staph infection s/p lumbar surgery (surgery x 3).  Annotation: previous methicillin sensitive  staphylococcus aureus 2006 Qualifier: Diagnosis of  By: Eben MD, Reyes  (ID Specialist)     Prostate cancer screening    via Urol--> PSA 1.39 April 2019. 1.36 Feb 2020.   TIA (transient ischemic attack)    03/2022.   Tinnitus, left ear    Past Surgical History:  Procedure Laterality Date   COLONOSCOPY  10/12/2015   repeat 5 years   COLONOSCOPY W/ POLYPECTOMY  2007   Negative 2012 and 2017; Dr Madelyne 2022.   CYSTOSCOPY     with bladder biopsy:  dome of bladder with an area of chronic inflammation.  No malignancy.   CYSTOSCOPY WITH BIOPSY N/A 02/19/2018   Procedure: CYSTOSCOPY WITH BIOPSY/ FULGURATION / UROLIFT;  Surgeon: Nieves Cough, MD;  Location: [facility];  Service: Urology;  Laterality: N/A;   CYSTOSCOPY WITH FULGERATION N/A 07/30/2020   Bx benign. Procedure: CYSTOSCOPY WITH BLADDER BIOPSY/FULGERATION;  Surgeon: Nieves Cough, MD;  Location: [facility];  Service: Urology;  Laterality: N/A;   CYSTOSCOPY WITH INSERTION OF UROLIFT  02/19/2018   LOOP RECORDER IMPLANTATION     08/2022   LUMBAR LAMINECTOMY     Dr Unice performed 2nd & 3rd procedures (pt also has hx of fall and vertebral fracture years prior to his surgeries.   TONSILLECTOMY  as child   TRANSTHORACIC ECHOCARDIOGRAM     03/2022 EF normal, grd I DD, mild AI, mod Pulm insuff   Patient has no known allergies. Current Outpatient Medications  Medication Sig Dispense Refill   apixaban  (ELIQUIS ) 5 MG TABS tablet Take 1 tablet (5 mg total) by mouth 2 (two) times daily. 60 tablet 3   Ascorbic Acid (VITAMIN C) 1000 MG tablet Take 1,000 mg by mouth at bedtime.     atorvastatin  (LIPITOR) 80 MG tablet Take 1 tablet (80 mg total) by mouth daily. 90 tablet 3    cloNIDine  (CATAPRES ) 0.1 MG tablet Take 1 tablet (0.1 mg total) by mouth daily as needed (Take once if systolic blood pressure over 170). 30 tablet 0   finasteride  (PROSCAR ) 5 MG tablet TAKE 1 TABLET BY MOUTH EVERYDAY AT BEDTIME 30 tablet 0   losartan -hydrochlorothiazide (HYZAAR) 100-12.5 MG tablet TAKE 1 TABLET BY MOUTH EVERY DAY 90 tablet 1   metoprolol  succinate (TOPROL -XL) 100 MG 24 hr tablet TAKE 1 TABLET BY MOUTH EVERY DAY WITH OR IMMEDIATELY FOLLOWING A MEAL 90 tablet 2   pantoprazole  (PROTONIX ) 40 MG tablet Take 1 tablet (40 mg total) by mouth daily. 90 tablet 3   tadalafil  (CIALIS ) 10 MG tablet 1-2 tabs po qd prn.  Take approx 1 hour prior to intercourse 10 tablet 1   tamsulosin  (FLOMAX ) 0.4 MG CAPS capsule Take 1 capsule (0.4 mg total) by mouth daily. (Patient taking differently: Take 0.4 mg by mouth as needed.) 90 capsule 3  No current facility-administered medications for this encounter.    Physical Exam: BP 112/76   Pulse 65   Ht 5' 8 (1.727 m)   Wt 117.8 kg   BMI 39.47 kg/m   GEN: Well nourished, well developed in no acute distress NECK: No JVD; No carotid bruits CARDIAC: Regular rate and rhythm, no murmurs, rubs, gallops RESPIRATORY:  Clear to auscultation without rales, wheezing or rhonchi  ABDOMEN: Soft, non-tender, non-distended EXTREMITIES: Trace bilateral lower extremity edema Wt Readings from Last 3 Encounters:  06/19/24 117.8 kg  05/19/24 118.3 kg  03/06/24 117.3 kg    Lab Results  Component Value Date   TSH 1.33 05/23/2022   EKG today demonstrates:   EKG Interpretation Date/Time:  Thursday June 19 2024 08:51:35 EST Ventricular Rate:  65 PR Interval:  176 QRS Duration:  88 QT Interval:  416 QTC Calculation: 432 R Axis:   23  Text Interpretation: Normal sinus rhythm Low voltage QRS Cannot rule out Anterior infarct , age undetermined Abnormal ECG Confirmed by Wyn Manus [phone]) on 06/19/2024 8:52:50 AM        Echo Completed 02/2022: 1.  Left ventricular ejection fraction, by estimation, is 65 to 70%. The  left ventricle has normal function. The left ventricle has no regional  wall motion abnormalities. Left ventricular diastolic parameters are  consistent with Grade I diastolic  dysfunction (impaired relaxation).   2. Right ventricular systolic function is normal. The right ventricular  size is normal.   3. The mitral valve is normal in structure. No evidence of mitral valve  regurgitation. No evidence of mitral stenosis.   4. The aortic valve is normal in structure. Aortic valve regurgitation is  mild. No aortic stenosis is present.   5. Pulmonic valve regurgitation is moderate.   6. The inferior vena cava is normal in size with greater than 50%  respiratory variability, suggesting right atrial pressure of 3 mmHg.   CHA2DS2-VASc Score = 7  The patient's score is based upon: CHF History: 1 HTN History: 1 Diabetes History: 0 Stroke History: 2 Vascular Disease History: 1 Age Score: 2 Gender Score: 0     ASSESSMENT AND PLAN: Paroxysmal Atrial Fibrillation (ICD10:  I48.0) The patient's CHA2DS2-VASc score is 7, indicating a 11.2% annual risk of stroke.   -Intermittent episodes with elevated burden on loop recorder. No significant symptoms. Discussed ablation and Tikosyn as treatment options. Shared decision-making emphasized, with interest in ablation. - Referred to electrophysiologist for ablation evaluation. - Continue anticoagulation therapy. - Monitor for symptomatic AFib episodes - CBC today - Encouraged lifestyle modifications including weight loss and improved sleep.  Secondary Hypercoagulable State (ICD10:  D68.69) The patient is at significant risk for stroke/thromboembolism based upon his CHA2DS2-VASc Score of 7.  Continue Apixaban  (Eliquis ).   HTN: BP well controlled. Continue current antihypertensive regimen.    HFpEF: - 2D echo completed 02/2022 showing normal EF valve function with no evidence of  structural heart disease   Coronary calcifications: -Mild coronary calcification seen on-cardiac MRI -Continue atorvastatin  as prescribed  OSA: - Previous sleep study with recommendation of CPAP however patient declined.  - Discussed benefits of CPAP in reducing AFib and improving sleep.  - Encouraged CPAP trial to assess impact on AFib and sleep quality.  Obesity: - Patient's BMI is 39.47 kg/m - He is interested in weight loss medications and was advised to follow-up with his PCP.  Signed,  Wyn Raddle, Manus Shove, NP    06/19/2024 9:44 AM     Follow  up with the AF Clinic in 3 months    "

## 2024-06-19 ENCOUNTER — Encounter (HOSPITAL_COMMUNITY): Payer: Self-pay | Admitting: Nurse Practitioner

## 2024-06-19 ENCOUNTER — Ambulatory Visit (HOSPITAL_COMMUNITY)
Admission: RE | Admit: 2024-06-19 | Discharge: 2024-06-19 | Disposition: A | Discharge status: Home / Self Care | Source: Ambulatory Visit | Attending: Nurse Practitioner | Admitting: Nurse Practitioner

## 2024-06-19 VITALS — BP 112/76 | HR 65 | Ht 68.0 in | Wt 259.6 lb

## 2024-06-19 DIAGNOSIS — I10 Essential (primary) hypertension: Secondary | ICD-10-CM | POA: Diagnosis present

## 2024-06-19 DIAGNOSIS — G4733 Obstructive sleep apnea (adult) (pediatric): Secondary | ICD-10-CM | POA: Diagnosis present

## 2024-06-19 DIAGNOSIS — E66812 Obesity, class 2: Secondary | ICD-10-CM | POA: Insufficient documentation

## 2024-06-19 DIAGNOSIS — Z6839 Body mass index (BMI) 39.0-39.9, adult: Secondary | ICD-10-CM | POA: Insufficient documentation

## 2024-06-19 DIAGNOSIS — I480 Paroxysmal atrial fibrillation: Secondary | ICD-10-CM | POA: Diagnosis present

## 2024-06-19 DIAGNOSIS — I5032 Chronic diastolic (congestive) heart failure: Secondary | ICD-10-CM | POA: Diagnosis present

## 2024-06-19 DIAGNOSIS — I2510 Atherosclerotic heart disease of native coronary artery without angina pectoris: Secondary | ICD-10-CM | POA: Diagnosis present

## 2024-06-20 ENCOUNTER — Ambulatory Visit (HOSPITAL_COMMUNITY): Payer: Self-pay | Admitting: Nurse Practitioner

## 2024-06-20 LAB — CBC
Hematocrit: 52.4 % — ABNORMAL HIGH (ref 37.5–51.0)
Hemoglobin: 17.6 g/dL (ref 13.0–17.7)
MCH: 30 pg (ref 26.6–33.0)
MCHC: 33.6 g/dL (ref 31.5–35.7)
MCV: 89 fL (ref 79–97)
Platelets: 220 10*3/uL (ref 150–450)
RBC: 5.87 x10E6/uL — ABNORMAL HIGH (ref 4.14–5.80)
RDW: 14.1 % (ref 11.6–15.4)
WBC: 9.2 10*3/uL (ref 3.4–10.8)

## 2024-07-07 ENCOUNTER — Encounter

## 2024-07-08 ENCOUNTER — Ambulatory Visit

## 2024-07-10 ENCOUNTER — Encounter

## 2024-07-11 ENCOUNTER — Ambulatory Visit

## 2024-08-07 ENCOUNTER — Encounter

## 2024-08-08 ENCOUNTER — Ambulatory Visit

## 2024-08-11 ENCOUNTER — Ambulatory Visit

## 2024-08-11 ENCOUNTER — Encounter

## 2024-09-04 ENCOUNTER — Ambulatory Visit: Admitting: Family Medicine

## 2024-09-07 ENCOUNTER — Encounter

## 2024-09-08 ENCOUNTER — Ambulatory Visit

## 2024-09-11 ENCOUNTER — Ambulatory Visit

## 2024-09-11 ENCOUNTER — Encounter

## 2024-09-17 ENCOUNTER — Ambulatory Visit (HOSPITAL_COMMUNITY): Admitting: Nurse Practitioner
